# Patient Record
Sex: Male | Born: 1935 | Race: White | Hispanic: No | Smoking: Never smoker
Health system: Southern US, Community
[De-identification: ages and names within clinical notes are randomized; demographics above are authoritative.]

## PROBLEM LIST (undated history)

## (undated) DIAGNOSIS — C61 Malignant neoplasm of prostate: Secondary | ICD-10-CM

## (undated) DIAGNOSIS — Z7901 Long term (current) use of anticoagulants: Secondary | ICD-10-CM

## (undated) DIAGNOSIS — R0789 Other chest pain: Secondary | ICD-10-CM

## (undated) DIAGNOSIS — R42 Dizziness and giddiness: Secondary | ICD-10-CM

## (undated) DIAGNOSIS — H9190 Unspecified hearing loss, unspecified ear: Secondary | ICD-10-CM

## (undated) DIAGNOSIS — H269 Unspecified cataract: Secondary | ICD-10-CM

## (undated) DIAGNOSIS — I484 Atypical atrial flutter: Secondary | ICD-10-CM

## (undated) DIAGNOSIS — G459 Transient cerebral ischemic attack, unspecified: Secondary | ICD-10-CM

## (undated) HISTORY — DX: Transient cerebral ischemic attack, unspecified: G45.9

## (undated) HISTORY — PX: TONSILLECTOMY: SUR1361

## (undated) HISTORY — PX: EYE SURGERY: SHX253

## (undated) HISTORY — DX: Unspecified cataract: H26.9

## (undated) HISTORY — PX: APPENDECTOMY: SHX54

## (undated) HISTORY — DX: Long term (current) use of anticoagulants: Z79.01

## (undated) HISTORY — DX: Unspecified hearing loss, unspecified ear: H91.90

## (undated) HISTORY — DX: Atypical atrial flutter: I48.4

## (undated) HISTORY — DX: Other chest pain: R07.89

## (undated) HISTORY — DX: Dizziness and giddiness: R42

## (undated) HISTORY — DX: Malignant neoplasm of prostate: C61

---

## 2002-06-05 ENCOUNTER — Encounter: Payer: Self-pay | Admitting: Internal Medicine

## 2002-06-05 ENCOUNTER — Inpatient Hospital Stay (HOSPITAL_COMMUNITY): Admission: EM | Admit: 2002-06-05 | Discharge: 2002-06-07 | Payer: Self-pay | Admitting: Neurosurgery

## 2002-06-06 ENCOUNTER — Encounter: Payer: Self-pay | Admitting: Neurosurgery

## 2002-06-07 ENCOUNTER — Encounter: Payer: Self-pay | Admitting: Neurosurgery

## 2002-06-20 ENCOUNTER — Ambulatory Visit (HOSPITAL_COMMUNITY): Admission: RE | Admit: 2002-06-20 | Discharge: 2002-06-20 | Payer: Self-pay | Admitting: Neurosurgery

## 2002-06-20 ENCOUNTER — Encounter: Payer: Self-pay | Admitting: Neurosurgery

## 2002-08-13 ENCOUNTER — Ambulatory Visit: Admission: RE | Admit: 2002-08-13 | Discharge: 2002-08-27 | Payer: Self-pay | Admitting: Radiation Oncology

## 2004-05-18 ENCOUNTER — Ambulatory Visit: Admission: RE | Admit: 2004-05-18 | Discharge: 2004-08-16 | Payer: Self-pay | Admitting: Radiation Oncology

## 2004-09-22 ENCOUNTER — Ambulatory Visit: Admission: RE | Admit: 2004-09-22 | Discharge: 2004-09-22 | Payer: Self-pay | Admitting: Radiation Oncology

## 2010-08-06 ENCOUNTER — Emergency Department (HOSPITAL_COMMUNITY): Payer: Medicare Other

## 2010-08-06 ENCOUNTER — Inpatient Hospital Stay (HOSPITAL_COMMUNITY)
Admission: EM | Admit: 2010-08-06 | Discharge: 2010-08-12 | DRG: 287 | Disposition: A | Discharge status: Home / Self Care | Payer: Medicare Other | Attending: Cardiology | Admitting: Cardiology

## 2010-08-06 DIAGNOSIS — I4891 Unspecified atrial fibrillation: Secondary | ICD-10-CM | POA: Diagnosis present

## 2010-08-06 DIAGNOSIS — G459 Transient cerebral ischemic attack, unspecified: Secondary | ICD-10-CM | POA: Diagnosis present

## 2010-08-06 DIAGNOSIS — H538 Other visual disturbances: Secondary | ICD-10-CM | POA: Diagnosis present

## 2010-08-06 DIAGNOSIS — I369 Nonrheumatic tricuspid valve disorder, unspecified: Secondary | ICD-10-CM

## 2010-08-06 DIAGNOSIS — Z8546 Personal history of malignant neoplasm of prostate: Secondary | ICD-10-CM

## 2010-08-06 DIAGNOSIS — Z880 Allergy status to penicillin: Secondary | ICD-10-CM

## 2010-08-06 DIAGNOSIS — M1990 Unspecified osteoarthritis, unspecified site: Secondary | ICD-10-CM | POA: Diagnosis present

## 2010-08-06 DIAGNOSIS — R0789 Other chest pain: Secondary | ICD-10-CM | POA: Diagnosis present

## 2010-08-06 DIAGNOSIS — I2510 Atherosclerotic heart disease of native coronary artery without angina pectoris: Secondary | ICD-10-CM | POA: Diagnosis present

## 2010-08-06 DIAGNOSIS — H9190 Unspecified hearing loss, unspecified ear: Secondary | ICD-10-CM | POA: Diagnosis present

## 2010-08-06 DIAGNOSIS — I4892 Unspecified atrial flutter: Principal | ICD-10-CM | POA: Diagnosis present

## 2010-08-06 DIAGNOSIS — R079 Chest pain, unspecified: Secondary | ICD-10-CM

## 2010-08-06 DIAGNOSIS — Z7901 Long term (current) use of anticoagulants: Secondary | ICD-10-CM

## 2010-08-06 LAB — POCT CARDIAC MARKERS
CKMB, poc: 1.2 ng/mL (ref 1.0–8.0)
Myoglobin, poc: 37.9 ng/mL (ref 12–200)
Troponin i, poc: <0.05 ng/mL (ref 0.00–0.09)

## 2010-08-06 LAB — POCT I-STAT, CHEM 8
Calcium, Ion: 1.17 mmol/L (ref 1.12–1.32)
Chloride: 106 mEq/L (ref 96–112)
HCT: 48 % (ref 39.0–52.0)
Sodium: 140 mEq/L (ref 135–145)
TCO2: 25 mmol/L (ref 0–100)

## 2010-08-06 LAB — COMPREHENSIVE METABOLIC PANEL
ALT: 16 U/L (ref 0–53)
AST: 16 U/L (ref 0–37)
Albumin: 3.5 g/dL (ref 3.5–5.2)
Alkaline Phosphatase: 53 U/L (ref 39–117)
Chloride: 113 mEq/L — ABNORMAL HIGH (ref 96–112)
GFR calc Af Amer: >60 mL/min (ref >60)
Potassium: 4.2 mEq/L (ref 3.5–5.1)
Sodium: 140 mEq/L (ref 135–145)
Total Bilirubin: 1.3 mg/dL — ABNORMAL HIGH (ref 0.3–1.2)

## 2010-08-06 LAB — CK TOTAL AND CKMB (NOT AT ARMC): CK, MB: 3 ng/mL (ref 0.3–4.0)

## 2010-08-06 LAB — CBC
Platelets: 159 10*3/uL (ref 150–400)
RBC: 5.06 MIL/uL (ref 4.22–5.81)
RDW: 14.2 % (ref 11.5–15.5)
WBC: 9 10*3/uL (ref 4.0–10.5)

## 2010-08-06 LAB — TSH: TSH: 3.029 u[IU]/mL (ref 0.350–4.500)

## 2010-08-07 DIAGNOSIS — I4891 Unspecified atrial fibrillation: Secondary | ICD-10-CM

## 2010-08-07 LAB — BASIC METABOLIC PANEL
GFR calc non Af Amer: >60 mL/min (ref >60)
Glucose, Bld: 104 mg/dL — ABNORMAL HIGH (ref 70–99)
Potassium: 4 mEq/L (ref 3.5–5.1)
Sodium: 138 mEq/L (ref 135–145)

## 2010-08-07 LAB — CBC
HCT: 40.6 % (ref 39.0–52.0)
Hemoglobin: 14.9 g/dL (ref 13.0–17.0)
RBC: 4.49 MIL/uL (ref 4.22–5.81)
RDW: 14 % (ref 11.5–15.5)
WBC: 6 10*3/uL (ref 4.0–10.5)

## 2010-08-07 LAB — CARDIAC PANEL(CRET KIN+CKTOT+MB+TROPI)
CK, MB: 2 ng/mL (ref 0.3–4.0)
CK, MB: 1.8 ng/mL (ref 0.3–4.0)
Total CK: 28 U/L (ref 7–232)
Total CK: 28 U/L (ref 7–232)

## 2010-08-07 LAB — GLUCOSE, CAPILLARY

## 2010-08-07 LAB — HEPARIN LEVEL (UNFRACTIONATED): Heparin Unfractionated: 0.15 IU/mL — ABNORMAL LOW (ref 0.30–0.70)

## 2010-08-08 LAB — CBC
MCV: 91.6 fL (ref 78.0–100.0)
Platelets: 124 10*3/uL — ABNORMAL LOW (ref 150–400)
RBC: 5 MIL/uL (ref 4.22–5.81)
WBC: 5.1 10*3/uL (ref 4.0–10.5)

## 2010-08-08 NOTE — H&P (Addendum)
Larry Gates, Larry Gates                 ACCOUNT NO.:  [id number]  MEDICAL RECORD NO.:  [id number]           PATIENT TYPE:  I  LOCATION:  2918                         FACILITY:  [facility]  PHYSICIAN:  Denice Bors. Stanford Breed, MD, FACCDATE OF BIRTH:  02-28-1929  DATE OF ADMISSION:  08/06/2010 DATE OF DISCHARGE:                             HISTORY & PHYSICAL   PRIMARY CARDIOLOGIST:  New.  PRIMARY MEDICAL DOCTOR:  Larry Gates.  CHIEF COMPLAINT:  Chest pain.  HISTORY OF PRESENT ILLNESS:  Larry Gates is a 77 year old gentleman with no significant past medical history with the exception of prostate cancer, with no prior cardiac history who presents to [facility] ER with chest pain.  He had been under some stress with his sister-in-law, who refuses to speak to him and recently requested that he return some table.  While driving to take some over to her house around 7:15 this morning, he developed chest discomfort, which he describes as a hurting sensation, difficult to describe, not dull or sharp, just uncomfortable. He dropped off the table.  During that time, the discomfort had subsided.  He came home, while talking to a friend developed blurry vision, felt weak, sat down, and was somewhat sweaty.  He began to feel better, so helped take the trash cans back up the hill to put them away. That is when he experienced chest pain again, with same discomfort without shortness of breath or radiation.  He also had a third episode while going up stairs later on.  He called his son and notified him of the pain, was taken to his primary care providers where he was found to be in atrial tachycardia with heart rates in the 120s to 130s.  Review of the strip demonstrates that he is likely in atrial flutter with 2:1 conduction, of unknown duration.  Blood pressure has been borderline low, in the ER, low 100s, more recently 120 on a diltiazem drip.  He is still going somewhat fast in the  120s.  The chest pain is not associated with position changes or inspiration.  He had an episode of paleness/weakness approximately 1 month ago, but this was not associated with chest pain.  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  Of note also with his episode of blurred vision, he had no loss of consciousness, weakness, or aphasia.  PAST MEDICAL HISTORY: 1. Prostate cancer status post radiation in 2006, followed by Urology     every 12 months. 2. Appendectomy as a child. 3. Hard-of-hearing. 4. Left knee scope remotely. 5. Tonsillectomy. 6. Occasional vertigo when supine.  MEDICATIONS:  Aspirin 81 mg daily, multivitamin 1 tablet daily.  ALLERGIES:  PENICILLIN.  SOCIAL HISTORY:  Larry Gates lives in [place].  He is widowed.  He has one son who is present with his daughter-in-law here today.  He is very active, so doing farm work.  He denies any history of tobacco abuse, alcohol, or drug use.FAMILY HISTORY:  His mother passed away with  complications of Alzheimer's at 35.  Father died at 74 of a CVA.  He has one brother who had possibly stents placed in his 36s to 23s.  REVIEW OF SYSTEMS:  No fevers, chills, shortness of breath, dyspnea on exertion, edema, palpitations, syncope, bright red blood per rectum, melena, or hematemesis.  All other systems reviewed and otherwise negative except those noted in the HPI.  LABORATORY DATA:  WBC 9, hemoglobin 17, hematocrit 46, platelet count 159.  Sodium 140, potassium 4.7, chloride 106, glucose 108, BUN 16, creatinine 0.7.  RADIOLOGY:  None yet.  EKG:  Atrial tachycardia with a heart rate approximately 125, likely AFib, no acute changes.  PHYSICAL EXAMINATION:  VITAL SIGNS:  Temperature 98.2, pulse 127, respirations 16, blood pressure 101/68, pulse ox 99% on 2 L. GENERAL:  This is a pleasant, generally well-appearing male in  no acute distress. HEENT:  Normocephalic and atraumatic.  Extraocular movements are intact. Clear sclerae.  Nares without discharge. NECK:  Supple. HEART:  Auscultation to the heart reveals regular rhythm, tachycardic without significant murmurs, rubs, or gallops. LUNGS:  Clear to auscultation bilaterally without wheezes, rales, or rhonchi. ABDOMEN:  Soft, nontender, nondistended.  Positive bowel sounds. EXTREMITIES:  Warm, dry, and without edema. NEUROLOGIC:  He is alert and oriented x3, responds to questions appropriately with a normal affect.  ASSESSMENT/PLAN:  The patient was seen and examined by Dr. Stanford Breed and myself.  This is a 77 year old gentleman with no significant past medical history except for prostate cancer who presents with three episodes of substernal chest pain without radiation or associated symptoms, but the latter 2 episodes were brought on by exertion.  He was seen by his primary care doctor.  EKG demonstrated atrial flutter.  Dr. Stanford Breed performed carotid massage at the bedside and a strip was obtained.  This was initially thought to be coarse AFib, but Dr. Stanford Breed believes it is atrial flutter and will be reviewing it with the electrophysiologist. 1. Chest pain.  This is concerning for unstable angina.  We plan to     admit the patient, rule out myocardial infarction.  He was     recommended for cardiac catheterization on Monday to define his     coronary anatomy.  Risks and benefits were discussed with the     patient who agrees to proceed.  We will treat him an aspirin,     statin, and heparin as his CT of the head is negative given his     blurry vision.  We do not know what his underlying heart rate will     be if he converts, so we will hold off on beta blockade for now     given his ongoing Cardizem. 2. Atrial flutter.  The duration is unknown of this.  We plan to rate     control right now with Cardizem.  We will check an echo along with     a  TSH.  Dr. Stanford Breed reviewed this case with EP for possible     ablation, although the flutter appears atypical.  If no ablation is     deemed appropriate, plan for TEE cardioversion next week with long-     term Coumadin given possible TIAs. 3. Transient blurred vision, question transient ischemic attack.  We     will check head CT to rule out bleed.  If no bleed, add heparin and     as above check echo and TSH.  He will be continued on his aspirin  if his head CT is negative as well.     Dayna Dunn, P.A.C.   ______________________________ Denice Bors. Stanford Breed, MD, [facility]    DD/MEDQ  D:  08/06/2010  T:  08/07/2010  Job:  [id number]  cc:   [place]  Electronically Signed by Kirk Ruths MD [facility] on 08/08/2010 01:25:51 PM Electronically Signed by Melina Copa  on 08/25/2010 04:21:16 PM

## 2010-08-09 DIAGNOSIS — I2510 Atherosclerotic heart disease of native coronary artery without angina pectoris: Secondary | ICD-10-CM

## 2010-08-09 LAB — CBC
HCT: 45.2 % (ref 39.0–52.0)
Hemoglobin: 16.8 g/dL (ref 13.0–17.0)
RBC: 4.95 MIL/uL (ref 4.22–5.81)
WBC: 6.7 10*3/uL (ref 4.0–10.5)

## 2010-08-09 LAB — BASIC METABOLIC PANEL
CO2: 28 mEq/L (ref 19–32)
Chloride: 105 mEq/L (ref 96–112)
GFR calc Af Amer: >60 mL/min (ref >60)
Sodium: 140 mEq/L (ref 135–145)

## 2010-08-10 LAB — CBC
Hemoglobin: 15.5 g/dL (ref 13.0–17.0)
RBC: 4.62 MIL/uL (ref 4.22–5.81)

## 2010-08-10 LAB — PROTIME-INR
INR: 1.05 (ref 0.00–1.49)
Prothrombin Time: 13.9 seconds (ref 11.6–15.2)

## 2010-08-10 LAB — BASIC METABOLIC PANEL
GFR calc non Af Amer: >60 mL/min (ref >60)
Potassium: 3.7 mEq/L (ref 3.5–5.1)
Sodium: 140 mEq/L (ref 135–145)

## 2010-08-10 LAB — HEPARIN LEVEL (UNFRACTIONATED): Heparin Unfractionated: <0.1 IU/mL — ABNORMAL LOW (ref 0.30–0.70)

## 2010-08-11 LAB — CBC
MCH: 33.6 pg (ref 26.0–34.0)
MCV: 91.6 fL (ref 78.0–100.0)
Platelets: 126 10*3/uL — ABNORMAL LOW (ref 150–400)
RBC: 4.43 MIL/uL (ref 4.22–5.81)
RDW: 14 % (ref 11.5–15.5)

## 2010-08-11 LAB — HEPARIN LEVEL (UNFRACTIONATED)
Heparin Unfractionated: 0.35 IU/mL (ref 0.30–0.70)
Heparin Unfractionated: 0.34 IU/mL (ref 0.30–0.70)

## 2010-08-12 DIAGNOSIS — I4892 Unspecified atrial flutter: Secondary | ICD-10-CM

## 2010-08-12 LAB — CBC
HCT: 42.5 % (ref 39.0–52.0)
MCH: 33.6 pg (ref 26.0–34.0)
MCHC: 36.2 g/dL — ABNORMAL HIGH (ref 30.0–36.0)
RDW: 14.3 % (ref 11.5–15.5)

## 2010-08-12 LAB — PROTIME-INR: Prothrombin Time: 18 seconds — ABNORMAL HIGH (ref 11.6–15.2)

## 2010-08-17 ENCOUNTER — Ambulatory Visit: Payer: Self-pay | Admitting: Cardiovascular Disease

## 2010-08-17 ENCOUNTER — Encounter: Payer: Self-pay | Admitting: Cardiology

## 2010-08-17 DIAGNOSIS — I482 Chronic atrial fibrillation, unspecified: Secondary | ICD-10-CM | POA: Insufficient documentation

## 2010-08-19 NOTE — Cardiovascular Report (Signed)
Larry Gates, Larry Gates NO.:  [id number]  MEDICAL RECORD NO.:  [id number]           PATIENT TYPE:  I  LOCATION:  2013                         FACILITY:  [facility]  PHYSICIAN:  Juanda Bond. Burt Knack, MD  DATE OF BIRTH:  1928-12-14  DATE OF PROCEDURE:  08/09/2010 DATE OF DISCHARGE:                           CARDIAC CATHETERIZATION   PROCEDURE: 1. Left heart catheterization. 2. Selective coronary angiography. 3. Left ventricular angiography.  PROCEDURAL INDICATIONS:  Mr. Fetterman is a 77 year old gentleman who presented with chest pain.  He was found to have atrial flutter with RVR.  His symptoms were concerning for unstable angina in the setting of multiple cardiac risk factors and he was referred for cardiac catheterization.  Risks and indications of procedure were reviewed with the patient. Informed consent was obtained.  The right groin was prepped, draped and anesthetized with 1% lidocaine.  Using modified Seldinger technique, a 5- French sheath was placed in the right femoral artery.  Standard Judkins catheters were used for coronary angiography and left ventriculography. The catheter exchanges were performed over a guidewire.  The patient tolerated the procedure well.  There were no immediate complications.  PROCEDURAL FINDINGS:  Aortic pressure 115/63 with a mean of 85, left ventricular pressure 112/9.  Left ventriculography demonstrates mild global LV hypokinesis.  The LVEF is difficult to estimate in the setting of atrial flutter with RVR. There is marked irregularity with a heart rate in the 120s.  There appeared to be global hypokinesis of the left ventricle and I would estimate the ejection fraction at 45%.  Coronary angiography:  The left mainstem shows mild calcification.  The left main is widely patent and divides into the LAD and left circumflex.  LAD.  The LAD is patent.  It is a large proximal vessel and tapers to a moderate caliber vessel in the  midportion.  There are diffuse irregularities with 20-30% stenosis after the first septal perforator over a long segment of vessel.  There does not appear to be any significant obstructive disease in the LAD.  The mid and distal vessels have diffuse irregularities.  Left circumflex.  There is a moderate caliber intermediate branch. There is no obstructive disease of the intermediate.  The AV groove circumflex has mild irregularity but no significant stenosis.  It gives off two small obtuse marginal branches.  Right coronary artery.  The RCA is patent throughout its course.  It is a smooth vessel without significant stenosis.  The RCA is dominant and gives off small RV marginal, small acute marginal, small PDA, and two small posterolateral branches.  There is no significant stenosis throughout.  ASSESSMENT: 1. Minor diffuse nonobstructive coronary artery disease affecting     predominantly the left anterior descending (coronary) artery. 2. Mild global left ventricular systolic dysfunction. 3. Normal left ventricular end-diastolic pressure.  RECOMMENDATIONS:  The patient does not have obstructive coronary artery disease.  Dr. Jacalyn Lefevre notes were reviewed and we will reinitiate heparin and start warfarin.  He is planning on cardioversion after 3 weeks of therapeutic anticoagulation.     Juanda Bond. Burt Knack, MD     MDC/MEDQ  D:  08/09/2010  T:  08/09/2010  Job:  [id number]  cc:   Denice Bors. Stanford Breed, MD, [facility] Chipper Herb, M.D.  Electronically Signed by Sherren Mocha MD on 08/19/2010 10:40:27 AM

## 2010-08-24 ENCOUNTER — Ambulatory Visit (INDEPENDENT_AMBULATORY_CARE_PROVIDER_SITE_OTHER): Payer: Medicare Other | Admitting: *Deleted

## 2010-08-24 DIAGNOSIS — Z7901 Long term (current) use of anticoagulants: Secondary | ICD-10-CM | POA: Insufficient documentation

## 2010-08-24 DIAGNOSIS — I4891 Unspecified atrial fibrillation: Secondary | ICD-10-CM

## 2010-08-24 LAB — POCT INR: INR: 4.2

## 2010-08-26 ENCOUNTER — Encounter: Payer: Self-pay | Admitting: Cardiology

## 2010-08-30 ENCOUNTER — Encounter: Payer: Self-pay | Admitting: Cardiology

## 2010-08-30 ENCOUNTER — Ambulatory Visit (INDEPENDENT_AMBULATORY_CARE_PROVIDER_SITE_OTHER): Payer: Medicare Other | Admitting: Cardiology

## 2010-08-30 ENCOUNTER — Telehealth: Payer: Self-pay | Admitting: Cardiology

## 2010-08-30 VITALS — BP 144/68 | HR 118 | Resp 18 | Ht 69.0 in | Wt 153.4 lb

## 2010-08-30 DIAGNOSIS — I4891 Unspecified atrial fibrillation: Secondary | ICD-10-CM

## 2010-08-30 DIAGNOSIS — E785 Hyperlipidemia, unspecified: Secondary | ICD-10-CM

## 2010-08-30 DIAGNOSIS — I4892 Unspecified atrial flutter: Secondary | ICD-10-CM | POA: Insufficient documentation

## 2010-08-30 LAB — PROTIME-INR: INR: 2.3 ratio — ABNORMAL HIGH (ref 0.8–1.0)

## 2010-08-30 MED ORDER — DILTIAZEM HCL ER COATED BEADS 360 MG PO CP24: 240.0000 mg | ORAL_CAPSULE | Freq: Every day | ORAL | Status: DC

## 2010-08-30 NOTE — Assessment & Plan Note (Signed)
Continue statin. Check lipids and liver when he returns in 3 months.

## 2010-08-30 NOTE — Assessment & Plan Note (Signed)
Patient had atypical flutter. Heart rate elevated. Increase Cardizem to 360 mg daily. Continue Coumadin. Proceed with cardioversion 3 weeks after INR therapeutic. Reviewed with Dr. Rayann Heman. This flutter is atypical and would be more difficult to ablate.

## 2010-08-30 NOTE — Telephone Encounter (Signed)
Waiting on call results from coumadin from this am.

## 2010-08-30 NOTE — Progress Notes (Signed)
HPI: Patient recently admitted to [name] with chest pain and atrial flutter also symptoms of a TIA. Echocardiogram showed normal function. TSH normal. Cardiac cath revealed an ejection fraction of 45%, minor nonobstructive coronary disease. Patient placed on the rate controlling medications and Coumadin. Since discharge, the patient denies any dyspnea on exertion, orthopnea, PND, pedal edema, palpitations, syncope or chest pain.   Current Outpatient Prescriptions  Medication Sig Dispense Refill  . atorvastatin (LIPITOR) 10 MG tablet Take 10 mg by mouth daily.        [name] diltiazem (CARDIZEM CD) 240 MG 24 hr capsule Take 240 mg by mouth daily.        . Multiple Vitamin (MULTIVITAMIN PO) Take by mouth daily.        [name] warfarin (COUMADIN) 5 MG tablet Take by mouth as directed.        [name] DISCONTD: aspirin 81 MG tablet Take 81 mg by mouth daily.           Past Medical History  Diagnosis Date  . Prostate cancer   . Hard of hearing   . Vertigo   . Atrial flutter   . TIA (transient ischemic attack)     Past Surgical History  Procedure Date  . Appendectomy   . Tonsillectomy     History   Social History  . Marital Status: Widowed    Spouse Name: N/A    Number of Children: N/A  . Years of Education: N/A   Occupational History  . Not on file.   Social History Main Topics  . Smoking status: Never Smoker   . Smokeless tobacco: Not on file  . Alcohol Use: No  . Drug Use: No  . Sexually Active: Not on file   Other Topics Concern  . Not on file   Social History Narrative  . No narrative on file    ROS: no fevers or chills, productive cough, hemoptysis, dysphasia, odynophagia, melena, hematochezia, dysuria, hematuria, rash, seizure activity, orthopnea, PND, pedal edema, claudication. Remaining systems are negative.  Physical Exam: Well-developed well-nourished in no acute distress.  Skin is warm and dry.  HEENT is normal.  Neck is supple. No thyromegaly.  Chest is  clear to auscultation with normal expansion.  Cardiovascular exam is tachycardic and irregular Abdominal exam nontender or distended. No masses palpated. Right groin showed no hematoma and no bruit. Extremities show no edema. neuro grossly intact  ECG Atypical flutter with a heart rate of 118. No ST changes.

## 2010-08-30 NOTE — Patient Instructions (Signed)
Your physician recommends that you schedule a follow-up appointment in: 3 MONTHS WITH DR Stanford Breed  APPOINTMENT IN THE [facility] IN ONE WEEK(PENDING CARDIOVERSION)  Your physician recommends that you return for lab work in: [place] physician has recommended that you have a Cardioversion (DCCV). Electrical Cardioversion uses a jolt of electricity to your heart either through paddles or wired patches attached to your chest. This is a controlled, usually prescheduled, procedure. Defibrillation is done under light anesthesia in the hospital, and you usually go home the day of the procedure. This is done to get your heart back into a normal rhythm. You are not awake for the procedure. Please see the instruction sheet given to you today.

## 2010-08-31 ENCOUNTER — Ambulatory Visit (INDEPENDENT_AMBULATORY_CARE_PROVIDER_SITE_OTHER): Payer: Self-pay | Admitting: *Deleted

## 2010-08-31 DIAGNOSIS — R0989 Other specified symptoms and signs involving the circulatory and respiratory systems: Secondary | ICD-10-CM

## 2010-08-31 NOTE — Telephone Encounter (Signed)
Doen.  See coumadin note.

## 2010-09-01 ENCOUNTER — Telehealth: Payer: Self-pay | Admitting: Cardiology

## 2010-09-01 NOTE — Discharge Summary (Signed)
Larry Gates, Larry Gates NO.:  [id number]  MEDICAL RECORD NO.:  [id number]           PATIENT TYPE:  I  LOCATION:  2013                         FACILITY:  [facility]  PHYSICIAN:  Larry Gates, M.D.DATE OF BIRTH:  [birth year]  DATE OF ADMISSION:  08/06/2010 DATE OF DISCHARGE:  08/12/2010                              DISCHARGE SUMMARY   PRIMARY CARDIOLOGIST:  Larry Gates. Larry Breed, MD, [facility].  PRIMARY CARE PROVIDER:  Chipper Herb, MD  DISCHARGE DIAGNOSIS:  Atrial flutter with rapid ventricular response.  SECONDARY DIAGNOSES: 1. Chest pain without objective evidence of ischemia. 2. Nonobstructive coronary artery disease. 3. Possible transient ischemic attack with negative head CT this     admission. 4. History of prostate cancer status post radiation 2006. 5. Hard of hearing. 6. History of vertigo. 7. Osteoarthritis, status post left knee arthroscopy. 8. History of appendectomy as a child, status post tonsillectomy.  ALLERGIES:  PENICILLIN.  PROCEDURES: 1. 2-D echocardiogram, August 06, 2010, EF 55-60%, PASP 32 mmHg. 2. Diagnostic cardiac catheterization, August 10, 2010, showing     nonobstructive coronary artery disease with estimated EF 45%, mild     global hypokinesis, EF was difficult to objective secondary to     tachyarrhythmias. 3. Head CT, performed August 06, 2010, showing no acute or focal     intracranial abnormality.  Right maxillary sinusitis.  HISTORY OF PRESENT ILLNESS:  A 77 year old Caucasian male with the above problem list, who was in his usual state of health until the morning of admission when he had sudden onset of substernal chest discomfort. Later in the morning while talking with a friend, he had sudden onset of blurry vision, weakness, as well as diaphoresis.  He later experienced recurrent chest discomfort prompting him to present to his primary care provider's office and was found to be in atrial flutter with rates in 120s to 130s.   He was sent to [facility] ED for evaluation, where he was placed on diltiazem infusion with improvement in rate into the low 100s. He is admitted for further evaluation.  HOSPITAL COURSE:  It was felt, the patient's atrial flutter was atypical and likely not amenable to ablation.  The patient ruled out for MI. Chest pain was concerning for angina and therefore decision was made to pursue cardiac catheterization.  First, 2-D echocardiogram was undertaken on August 06, 2010, showing normal LV function.  No recurrent chest pain over the weekend, was maintained on heparin infusion.  His rate was reasonably controlled on diltiazem therapy.  Underwent diagnostic catheterization, on August 10, 2010, showing nonobstructive coronary artery disease.  Postcatheterization, he was placed on Lovenox and Coumadin was begun with the plan for cardioversion following 4 weeks of therapeutic INR.  The patient's INR this morning was 1.47.  As such, the patient will go home with Lovenox bridge because of concern for possible TIA the day of admission ( head CT was negative this admission) and will have a follow up INR in [place] on Monday August 16, 2010.  The patient is being discharged home today in good condition.  DISCHARGE LABS:  Hemoglobin 15.4, hematocrit 42.5, WBC 5.1, platelets 131, INR 1.47.  Sodium 140, potassium 3.7, chloride 107, CO2 of 28, BUN 15, creatinine 0.95, glucose 130, total bilirubin 1.3, alkaline phosphatase 53, AST 16, ALT 16, total protein 5.8, albumin 3.5, calcium 8.5, hemoglobin A1c 4.5, CK 28, MB 120, troponin-I 0.04, TSH 3.029. MRSA screen was negative.  DISPOSITION:  The patient will be discharged home today in good condition.  FOLLOWUP APPOINTMENTS:  The patient will follow up at [facility] for an INR on Monday August 16, 2010.  The patient will require weekly INRs document therapeutic range.  A follow up with Dr. Stanford Gates on  Aug 30, 2010, at 10 a.m. at which point, we were able to arrange an outpatient cardioversion.  DISCHARGE MEDICATIONS: 1. Diltiazem 240 mg daily. 2. Enoxaparin 70 mg b.i.d. x3 doses. 3. Lipitor 10 mg at bedtime. 4. Coumadin 5 mg at bedtime. 5. Multivitamin 1 tablet daily.  OUTSTANDING LAB STUDIES:  Follow up INR, August 16, 2010 at [facility] and then weekly thereafter.  Duration discharge encounter is 45 minutes including physician time.     Larry Gates, ANP   ______________________________ Larry Gates, M.D.    CB/MEDQ  D:  08/12/2010  T:  08/12/2010  Job:  [id number]  cc:   Larry Gates, M.D.  Electronically Signed by Larry Gates ANP on 08/14/2010 04:04:29 PM Electronically Signed by Larry Gates M.D. on 09/01/2010 06:05:40 PM

## 2010-09-01 NOTE — Telephone Encounter (Signed)
Wondering when cardioversion will be scheduled

## 2010-09-02 ENCOUNTER — Telehealth: Payer: Self-pay | Admitting: Cardiology

## 2010-09-02 NOTE — Telephone Encounter (Signed)
Spoke with pt son, he is aware the pt will need one more INR above 2.0 to be able to schedule the cardioversion. He has an appt for repeat protime on the 22nd. Fredia Beets

## 2010-09-02 NOTE — Telephone Encounter (Signed)
Pt wife aware will discuss with dr Stanford Breed and let them know Fredia Beets

## 2010-09-02 NOTE — Telephone Encounter (Signed)
Pt wife wants to know if pt is setup or when is his procedure going to be schedule for the cardioversion.

## 2010-09-07 ENCOUNTER — Ambulatory Visit (INDEPENDENT_AMBULATORY_CARE_PROVIDER_SITE_OTHER): Payer: Medicare Other | Admitting: *Deleted

## 2010-09-07 ENCOUNTER — Encounter: Payer: Medicare Other | Admitting: *Deleted

## 2010-09-07 DIAGNOSIS — I4891 Unspecified atrial fibrillation: Secondary | ICD-10-CM

## 2010-09-07 DIAGNOSIS — Z7901 Long term (current) use of anticoagulants: Secondary | ICD-10-CM

## 2010-09-07 LAB — POCT INR: INR: 2.9

## 2010-09-07 NOTE — Telephone Encounter (Signed)
Schedule DCCV when I am noninvasive or float; repeat INR the day prior to the procedure. Kirk Ruths

## 2010-09-07 NOTE — Telephone Encounter (Signed)
Status of cardioversion.

## 2010-09-07 NOTE — Telephone Encounter (Signed)
Per Dr. Stanford Breed pt will also need BMP and CBC day prior to procedure and pt to hold diltiazem the morning of the procedure.  I tried to call central scheduling to schedule for this Friday but they are closed for the day.

## 2010-09-07 NOTE — Telephone Encounter (Signed)
Spoke with pt's daughter-in-law who is asking about timing of cardioversion. Pt was seen by Coumadin clinic today at [facility] office with INR of 2.9. Pt is available rest of this week and would like to proceed with cardioversion as soon as possible.  Will forward to Dr. Stanford Breed to see when to schedule and if additional lab work is needed prior to procedure.  Call back number for tomorrow is pt's son Raimond [phone]

## 2010-09-08 ENCOUNTER — Other Ambulatory Visit: Payer: Self-pay | Admitting: *Deleted

## 2010-09-08 DIAGNOSIS — I4891 Unspecified atrial fibrillation: Secondary | ICD-10-CM

## 2010-09-08 NOTE — Telephone Encounter (Signed)
Spoke with pt's son and gave instructions and date of cardioversion--pt will come here 09/15/10 for BMET,CBC,PT/PTT--date of cardioversion is 09/16/10--son agrees--nt

## 2010-09-14 ENCOUNTER — Encounter: Payer: Medicare Other | Admitting: *Deleted

## 2010-09-15 ENCOUNTER — Telehealth: Payer: Self-pay | Admitting: Cardiology

## 2010-09-15 ENCOUNTER — Ambulatory Visit (INDEPENDENT_AMBULATORY_CARE_PROVIDER_SITE_OTHER): Payer: Medicare Other | Admitting: *Deleted

## 2010-09-15 ENCOUNTER — Other Ambulatory Visit (INDEPENDENT_AMBULATORY_CARE_PROVIDER_SITE_OTHER): Payer: Medicare Other | Admitting: *Deleted

## 2010-09-15 DIAGNOSIS — R0989 Other specified symptoms and signs involving the circulatory and respiratory systems: Secondary | ICD-10-CM

## 2010-09-15 DIAGNOSIS — I4891 Unspecified atrial fibrillation: Secondary | ICD-10-CM

## 2010-09-15 DIAGNOSIS — Z7901 Long term (current) use of anticoagulants: Secondary | ICD-10-CM

## 2010-09-15 LAB — BASIC METABOLIC PANEL
Calcium: 9.1 mg/dL (ref 8.4–10.5)
Chloride: 111 mEq/L (ref 96–112)
Creatinine, Ser: 1 mg/dL (ref 0.4–1.5)
GFR: 79.21 mL/min (ref >60.00)

## 2010-09-15 LAB — CBC WITH DIFFERENTIAL/PLATELET
Basophils Relative: 0.4 % (ref 0.0–3.0)
Eosinophils Relative: 1.5 % (ref 0.0–5.0)
Lymphocytes Relative: 17.5 % (ref 12.0–46.0)
MCV: 97 fl (ref 78.0–100.0)
Monocytes Relative: 7.6 % (ref 3.0–12.0)
Neutrophils Relative %: 73 % (ref 43.0–77.0)
Platelets: 128 10*3/uL — ABNORMAL LOW (ref 150.0–400.0)
RBC: 4.69 Mil/uL (ref 4.22–5.81)
WBC: 5.1 10*3/uL (ref 4.5–10.5)

## 2010-09-15 LAB — POCT INR: INR: 3.2

## 2010-09-15 NOTE — Telephone Encounter (Signed)
Larry Gates has paperwork that this pt scheduled for a cardioversion for 5-31 tomorrow and not on their schedule, this is the third one today and all for  dr Stanford Breed, also she is requesting that the person scheduling per their initials on the paper work

## 2010-09-15 NOTE — Telephone Encounter (Signed)
Larry Gates spoke with Larry Gates. She was made aware by Larry Gates that  paper work needed to be in central schedule not in endo.

## 2010-09-16 ENCOUNTER — Ambulatory Visit (HOSPITAL_COMMUNITY)
Admission: RE | Admit: 2010-09-16 | Discharge: 2010-09-16 | Disposition: A | Discharge status: Home / Self Care | Payer: Medicare Other | Source: Ambulatory Visit | Attending: Cardiology | Admitting: Cardiology

## 2010-09-16 DIAGNOSIS — I4891 Unspecified atrial fibrillation: Secondary | ICD-10-CM

## 2010-09-16 DIAGNOSIS — I2510 Atherosclerotic heart disease of native coronary artery without angina pectoris: Secondary | ICD-10-CM | POA: Insufficient documentation

## 2010-09-17 NOTE — Assessment & Plan Note (Signed)
[facility]  NAME:  LINDA, SEPER NO.:  [id number]  MEDICAL RECORD NO.:  [id number]      DATE OF BIRTH:  05/10/1928  PHYSICIAN:  Denice Bors. Stanford Breed, MD, [facility] VISIT DATE:  09/16/2010                                  OFFICE VISIT   This is cardioversion of atrial fibrillation.  The patient was sedated by Anesthesia with Diprivan 70 mg intravenously.  Synchronized cardioversion with 120 joules resulted in normal sinus rhythm.  There were no immediate complications.  We would recommend continuing Coumadin.     Denice Bors Stanford Breed, MD, [facility]     BSC/MEDQ  D:  09/16/2010  T:  09/17/2010  Job:  [id number]

## 2010-10-01 ENCOUNTER — Ambulatory Visit (INDEPENDENT_AMBULATORY_CARE_PROVIDER_SITE_OTHER): Payer: Medicare Other | Admitting: *Deleted

## 2010-10-01 DIAGNOSIS — Z7901 Long term (current) use of anticoagulants: Secondary | ICD-10-CM

## 2010-10-01 DIAGNOSIS — I4891 Unspecified atrial fibrillation: Secondary | ICD-10-CM

## 2010-10-01 LAB — POCT INR: INR: 2.4

## 2010-10-08 ENCOUNTER — Ambulatory Visit (INDEPENDENT_AMBULATORY_CARE_PROVIDER_SITE_OTHER): Payer: Medicare Other | Admitting: *Deleted

## 2010-10-08 DIAGNOSIS — I4891 Unspecified atrial fibrillation: Secondary | ICD-10-CM

## 2010-10-08 DIAGNOSIS — Z7901 Long term (current) use of anticoagulants: Secondary | ICD-10-CM

## 2010-10-08 LAB — POCT INR: INR: 2.8

## 2010-11-05 ENCOUNTER — Ambulatory Visit (INDEPENDENT_AMBULATORY_CARE_PROVIDER_SITE_OTHER): Payer: Medicare Other | Admitting: *Deleted

## 2010-11-05 DIAGNOSIS — Z7901 Long term (current) use of anticoagulants: Secondary | ICD-10-CM

## 2010-11-05 DIAGNOSIS — I4891 Unspecified atrial fibrillation: Secondary | ICD-10-CM

## 2010-11-05 LAB — POCT INR: INR: 2.1

## 2010-11-25 ENCOUNTER — Encounter: Payer: Self-pay | Admitting: Cardiology

## 2010-11-25 ENCOUNTER — Ambulatory Visit (INDEPENDENT_AMBULATORY_CARE_PROVIDER_SITE_OTHER): Payer: Medicare Other | Admitting: Cardiology

## 2010-11-25 ENCOUNTER — Ambulatory Visit (INDEPENDENT_AMBULATORY_CARE_PROVIDER_SITE_OTHER): Payer: Medicare Other | Admitting: *Deleted

## 2010-11-25 DIAGNOSIS — Z7901 Long term (current) use of anticoagulants: Secondary | ICD-10-CM

## 2010-11-25 DIAGNOSIS — I4891 Unspecified atrial fibrillation: Secondary | ICD-10-CM

## 2010-11-25 DIAGNOSIS — E785 Hyperlipidemia, unspecified: Secondary | ICD-10-CM

## 2010-11-25 DIAGNOSIS — E7800 Pure hypercholesterolemia, unspecified: Secondary | ICD-10-CM

## 2010-11-25 DIAGNOSIS — Z79899 Other long term (current) drug therapy: Secondary | ICD-10-CM

## 2010-11-25 LAB — LIPID PANEL
Cholesterol: 119 mg/dL (ref 0–200)
Triglycerides: 55 mg/dL (ref 0.0–149.0)

## 2010-11-25 LAB — HEPATIC FUNCTION PANEL
ALT: 23 U/L (ref 0–53)
Albumin: 4.3 g/dL (ref 3.5–5.2)
Bilirubin, Direct: 0.2 mg/dL (ref 0.0–0.3)
Total Protein: 6.8 g/dL (ref 6.0–8.3)

## 2010-11-25 LAB — POCT INR: INR: 2

## 2010-11-25 NOTE — Assessment & Plan Note (Signed)
Monitor in the Coumadin clinic. Goal INR 2-3.

## 2010-11-25 NOTE — Assessment & Plan Note (Signed)
The patient remains in sinus rhythm. Continue Cardizem. Possible previous TIA and age greater than 33. Continue Coumadin with goal INR 2-3.

## 2010-11-25 NOTE — Assessment & Plan Note (Signed)
Continue statin. Check lipids and liver. 

## 2010-11-25 NOTE — Patient Instructions (Signed)
Your physician wants you to follow-up in: 6 MONTHS You will receive a reminder letter in the mail two months in advance. If you don't receive a letter, please call our office to schedule the follow-up appointment. 

## 2010-11-25 NOTE — Progress Notes (Signed)
HPI: Patient previously admitted to [facility] with chest pain and atrial flutter also symptoms of a TIA. Echocardiogram showed normal function. TSH normal. Cardiac cath revealed an ejection fraction of 45%, minor nonobstructive coronary disease. Patient placed on the rate controlling medications and Coumadin. Had DCCV of atrial fibrillation on Sep 16 2010. Since then, the patient denies any dyspnea on exertion, orthopnea, PND, pedal edema, palpitations, syncope or chest pain.    Current Outpatient Prescriptions  Medication Sig Dispense Refill  . atorvastatin (LIPITOR) 10 MG tablet Take 10 mg by mouth daily.        [name] diltiazem (CARDIZEM CD) 360 MG 24 hr capsule Take 1 capsule (360 mg total) by mouth daily.  30 capsule  11  . Multiple Vitamin (MULTIVITAMIN PO) Take by mouth daily.        [name] warfarin (COUMADIN) 5 MG tablet Take by mouth as directed.           Past Medical History  Diagnosis Date  . Prostate cancer   . Hard of hearing   . Vertigo   . Atrial flutter   . TIA (transient ischemic attack)   . Atrial fibrillation     Past Surgical History  Procedure Date  . Appendectomy   . Tonsillectomy     History   Social History  . Marital Status: Widowed    Spouse Name: N/A    Number of Children: N/A  . Years of Education: N/A   Occupational History  . Not on file.   Social History Main Topics  . Smoking status: Never Smoker   . Smokeless tobacco: Not on file  . Alcohol Use: No  . Drug Use: No  . Sexually Active: Not on file   Other Topics Concern  . Not on file   Social History Narrative  . No narrative on file    ROS: no fevers or chills, productive cough, hemoptysis, dysphasia, odynophagia, melena, hematochezia, dysuria, hematuria, rash, seizure activity, orthopnea, PND, pedal edema, claudication. Remaining systems are negative.  Physical Exam: Well-developed well-nourished in no acute distress.  Skin is warm and dry.  HEENT is normal.  Neck is  supple. No thyromegaly.  Chest is clear to auscultation with normal expansion.  Cardiovascular exam is regular rate and rhythm.  Abdominal exam nontender or distended. No masses palpated. Extremities show no edema. neuro grossly intact  ECG NSR with no ST changes

## 2010-11-27 ENCOUNTER — Other Ambulatory Visit: Payer: Self-pay | Admitting: Cardiovascular Disease

## 2010-12-01 ENCOUNTER — Encounter: Payer: Self-pay | Admitting: *Deleted

## 2010-12-06 ENCOUNTER — Telehealth: Payer: Self-pay | Admitting: Cardiology

## 2010-12-06 NOTE — Telephone Encounter (Signed)
Per pt daughter calling wanting to know if pt needs to continue on his cholesterol medicine. Pt cholesterol is currently 119. Please return pt daughter call to advise/discuss.

## 2010-12-10 NOTE — Telephone Encounter (Signed)
Spoke with pts dtr-n-law, ann, they want to know since the pts total cholesterol is 119 can he stop his statin. Explained the benefit of statin in a person with CAD. She states the pt does not like to take meds and since he has never had problems with his chol does he really need to take. They would like for me to ask dr Stanford Breed Will forward for dr Stanford Breed review Fredia Beets

## 2010-12-10 NOTE — Telephone Encounter (Signed)
Pt daughter calling back for the second time asking if pt needs to continue taking his cholesterol medicine. Please return call to advise/discuss.

## 2010-12-10 NOTE — Telephone Encounter (Signed)
Would continue statin Kirk Ruths

## 2010-12-13 NOTE — Telephone Encounter (Signed)
Left message for pts dtr-n-law of dr Jacalyn Lefevre recommendations Fredia Beets

## 2010-12-24 ENCOUNTER — Ambulatory Visit (INDEPENDENT_AMBULATORY_CARE_PROVIDER_SITE_OTHER): Payer: Medicare Other | Admitting: *Deleted

## 2010-12-24 DIAGNOSIS — Z7901 Long term (current) use of anticoagulants: Secondary | ICD-10-CM

## 2010-12-24 DIAGNOSIS — I4891 Unspecified atrial fibrillation: Secondary | ICD-10-CM

## 2011-01-20 ENCOUNTER — Ambulatory Visit: Payer: Self-pay | Admitting: *Deleted

## 2011-01-21 ENCOUNTER — Encounter: Payer: Medicare Other | Admitting: *Deleted

## 2011-04-18 ENCOUNTER — Other Ambulatory Visit: Payer: Self-pay | Admitting: Cardiovascular Disease

## 2011-04-18 NOTE — Telephone Encounter (Signed)
Future warfarin prescriptions need to come from [place] as they are managing pt's coumadin now.

## 2011-05-24 ENCOUNTER — Encounter: Payer: Self-pay | Admitting: Cardiology

## 2011-05-24 ENCOUNTER — Ambulatory Visit (INDEPENDENT_AMBULATORY_CARE_PROVIDER_SITE_OTHER): Payer: Medicare Other | Admitting: Cardiology

## 2011-05-24 VITALS — BP 120/80 | HR 72 | Ht 69.0 in | Wt 149.0 lb

## 2011-05-24 DIAGNOSIS — I4891 Unspecified atrial fibrillation: Secondary | ICD-10-CM

## 2011-05-24 DIAGNOSIS — E785 Hyperlipidemia, unspecified: Secondary | ICD-10-CM

## 2011-05-24 DIAGNOSIS — I4892 Unspecified atrial flutter: Secondary | ICD-10-CM

## 2011-05-24 DIAGNOSIS — Z7901 Long term (current) use of anticoagulants: Secondary | ICD-10-CM

## 2011-05-24 LAB — CBC WITH DIFFERENTIAL/PLATELET
Eosinophils Absolute: 0.1 10*3/uL (ref 0.0–0.7)
Eosinophils Relative: 1.1 % (ref 0.0–5.0)
MCHC: 35.4 g/dL (ref 30.0–36.0)
MCV: 96.5 fl (ref 78.0–100.0)
Monocytes Absolute: 0.5 10*3/uL (ref 0.1–1.0)
Neutrophils Relative %: 69.2 % (ref 43.0–77.0)
Platelets: 140 10*3/uL — ABNORMAL LOW (ref 150.0–400.0)
WBC: 5.1 10*3/uL (ref 4.5–10.5)

## 2011-05-24 MED ORDER — DILTIAZEM HCL ER COATED BEADS 120 MG PO CP24: 120.0000 mg | ORAL_CAPSULE | Freq: Every day | ORAL | Status: DC

## 2011-05-24 MED ORDER — WARFARIN SODIUM 5 MG PO TABS: ORAL_TABLET | ORAL | Status: DC

## 2011-05-24 NOTE — Assessment & Plan Note (Signed)
Management per primary care. 

## 2011-05-24 NOTE — Assessment & Plan Note (Signed)
Coumadin is monitored by primary care. Check hemoglobin.

## 2011-05-24 NOTE — Patient Instructions (Signed)
Your physician wants you to follow-up in: [place] will receive a reminder letter in the mail two months in advance. If you don't receive a letter, please call our office to schedule the follow-up appointment.   START CARDIZEM CD 120 MG ONCE DAILY  Your physician recommends that you return for lab work in: [place]

## 2011-05-24 NOTE — Assessment & Plan Note (Signed)
Patient remains in sinus rhythm. Continue Coumadin. Resume Cardizem CD 120 mg daily for rate control in case atrial fibrillation recurs.

## 2011-05-24 NOTE — Progress Notes (Signed)
   [id number] previously admitted to [facility] with chest pain and atrial flutter also symptoms of a TIA. Echocardiogram showed normal function. TSH normal. Cardiac cath revealed an ejection fraction of 45%, minor nonobstructive coronary disease. Patient placed on the rate controlling medications and Coumadin. Had DCCV of atrial fibrillation on Sep 16 2010. I last saw him in August of 2012. Since then, the patient denies any dyspnea on exertion, orthopnea, PND, pedal edema, palpitations, syncope or chest pain.   Current Outpatient Prescriptions  Medication Sig Dispense Refill  . Multiple Vitamin (MULTIVITAMIN PO) Take by mouth daily.        [name] warfarin (COUMADIN) 5 MG tablet TAKE ONE TABLET BY MOUTH AT BEDTIME  30 tablet  2     Past Medical History  Diagnosis Date  . Prostate cancer   . Hard of hearing   . Vertigo   . Atrial flutter   . TIA (transient ischemic attack)   . Atrial fibrillation     Past Surgical History  Procedure Date  . Appendectomy   . Tonsillectomy     History   Social History  . Marital Status: Widowed    Spouse Name: N/A    Number of Children: N/A  . Years of Education: N/A   Occupational History  . Not on file.   Social History Main Topics  . Smoking status: Never Smoker   . Smokeless tobacco: Not on file  . Alcohol Use: No  . Drug Use: No  . Sexually Active: Not on file   Other Topics Concern  . Not on file   Social History Narrative  . No narrative on file    ROS: no fevers or chills, productive cough, hemoptysis, dysphasia, odynophagia, melena, hematochezia, dysuria, hematuria, rash, seizure activity, orthopnea, PND, pedal edema, claudication. Remaining systems are negative.  Physical Exam: Well-developed well-nourished in no acute distress.  Skin is warm and dry.  HEENT is normal.  Neck is supple. No thyromegaly.  Chest is clear to auscultation with normal expansion.  Cardiovascular exam is regular rate and rhythm. 2/6  systolic ejection murmur Abdominal exam nontender or distended. No masses palpated. Extremities show no edema. neuro grossly intact  ECG normal sinus rhythm at a rate of 72. No significant ST changes.

## 2011-08-31 ENCOUNTER — Other Ambulatory Visit: Payer: Self-pay | Admitting: Cardiovascular Disease

## 2011-09-19 ENCOUNTER — Encounter: Payer: Self-pay | Admitting: Cardiology

## 2011-09-19 LAB — PROTIME-INR

## 2011-10-10 ENCOUNTER — Telehealth: Payer: Self-pay | Admitting: Physician Assistant

## 2011-10-10 NOTE — Telephone Encounter (Addendum)
Pt dtr-in-law called because pt had been outside today and gotten very hot. He came in and cooled down but did not feel entirely well. RN friend came by and states BP 120/72 but HR is approx 160 and irregular. Advised them to hydrate him and call EMS. If EMS checks pt and gets a similar HR, he should come in by ambulance to the ER. They will do so.  EMS called from the home. Pt drank water and HR currently in the 130s, she believes SR. Pt does NOT want to come to the ER. He feels better after the water.   Advised the dtr that pt may have been mainly dehydrated with/without a bout of afib. Now that he feels better, if he is refusing to come to ER, would not push too hard. He is to continue to drink water, not work a long time outside if it's hot and the office will call him in am to check on him and get him an OV.

## 2011-10-11 ENCOUNTER — Encounter: Payer: Self-pay | Admitting: Nurse Practitioner

## 2011-10-11 ENCOUNTER — Ambulatory Visit (INDEPENDENT_AMBULATORY_CARE_PROVIDER_SITE_OTHER): Payer: Medicare Other | Admitting: Nurse Practitioner

## 2011-10-11 ENCOUNTER — Telehealth: Payer: Self-pay | Admitting: Cardiology

## 2011-10-11 VITALS — BP 118/88 | HR 122 | Ht 68.0 in | Wt 151.0 lb

## 2011-10-11 DIAGNOSIS — I4892 Unspecified atrial flutter: Secondary | ICD-10-CM

## 2011-10-11 LAB — CBC WITH DIFFERENTIAL/PLATELET
Basophils Absolute: 0 10*3/uL (ref 0.0–0.1)
Basophils Relative: 0.5 % (ref 0.0–3.0)
HCT: 48.2 % (ref 39.0–52.0)
Hemoglobin: 16.3 g/dL (ref 13.0–17.0)
Lymphs Abs: 1 10*3/uL (ref 0.7–4.0)
MCHC: 33.9 g/dL (ref 30.0–36.0)
Monocytes Relative: 8.1 % (ref 3.0–12.0)
Neutro Abs: 4.6 10*3/uL (ref 1.4–7.7)
RBC: 4.91 Mil/uL (ref 4.22–5.81)
RDW: 14.8 % — ABNORMAL HIGH (ref 11.5–14.6)

## 2011-10-11 LAB — BASIC METABOLIC PANEL
CO2: 25 mEq/L (ref 19–32)
Glucose, Bld: 100 mg/dL — ABNORMAL HIGH (ref 70–99)
Potassium: 4.1 mEq/L (ref 3.5–5.1)
Sodium: 138 mEq/L (ref 135–145)

## 2011-10-11 MED ORDER — DILTIAZEM HCL ER COATED BEADS 240 MG PO CP24: 240.0000 mg | ORAL_CAPSULE | Freq: Every day | ORAL | Status: DC

## 2011-10-11 NOTE — Progress Notes (Signed)
Patient Name: Larry Gates Date of Encounter: 10/11/2011  Primary Care Provider:  Redge Gainer, MD Primary Cardiologist:  B. Stanford Breed, MD  Patient Profile  78 year old male with history of atypical atrial flutter who presents with recurrent atrial flutter.  Problem List   Past Medical History  Diagnosis Date  . Prostate cancer   . Hard of hearing   . Vertigo   . Atypical atrial flutter     a. Dx 07/2010 and coumadin initiated;  b. 08/2010 s/p DCCV.  [name] TIA (transient ischemic attack)   . Midsternal chest pain     a. In setting of A Flutter 07/2010;  b. Cath 07/2010: nonobs dzs  . Anticoagulated on Coumadin     a. Followed @ [facility]   Past Surgical History  Procedure Date  . Appendectomy   . Tonsillectomy     Allergies  Allergies  Allergen Reactions  . Penicillins     HPI  78 year old male with the above problem list.  He is status post cardioversion in 2012 secondary to atypical atrial flutter.  At that time this letter was not felt to be easily amenable to ablation.  He is on chronic Coumadin anticoagulation and this is followed monthly at [facility].  His last subtherapeutic INR was in January of this year.  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  He developed mild chest discomfort which resolved with rest.  He then went back out into the fields later in the afternoon with recurrence of chest discomfort which again resolved with rest.  EMS was called out to the house by his family and they were told that the patient was tachycardic and likely dehydrated.  They had also talked to our on-call staff and this appointment was arranged today.  ECG this morning shows atypical atrial flutter at a rate of 122.  Patient is completely asymptomatic at rest.  Home Medications  Prior to Admission medications   Medication Sig Start Date End Date Taking?  Authorizing Provider  diltiazem (CARDIZEM CD) 240 MG 24 hr capsule Take 1 capsule (240 mg total) by mouth daily. 10/11/11 10/10/12 Yes Rogelia Mire, NP  Multiple Vitamin (MULTIVITAMIN PO) Take by mouth daily.     Yes Historical Provider, MD  warfarin (COUMADIN) 5 MG tablet TAKE AS DIRECTED 05/24/11  Yes Lelon Perla, MD    Family History  Family History  Problem Relation Age of Onset  . Stroke Father     Social History  History   Social History  . Marital Status: Widowed    Spouse Name: N/A    Number of Children: N/A  . Years of Education: N/A   Occupational History  . Not on file.   Social History Main Topics  . Smoking status: Never Smoker   . Smokeless tobacco: Not on file  . Alcohol Use: No  . Drug Use: No  . Sexually Active: Not on file   Other Topics Concern  . Not on file   Social History Narrative   Lives in [place] by himself.  Children live nearby and check in on him daily.     Review of Systems General:  No chills, fever, night sweats or weight changes.  Cardiovascular:  ++ chest pain & dyspnea on exertion yesterday.  No palpitations.  No edema, orthopnea, paroxysmal nocturnal dyspnea. Dermatological: No rash, lesions/masses Respiratory: No cough, dyspnea  Urologic: No hematuria, dysuria Abdominal:   No nausea, vomiting, diarrhea, bright red blood per rectum, melena, or hematemesis Neurologic:  No visual changes, wkns, changes in mental status. All other systems reviewed and are otherwise negative except as noted above.  Physical Exam  Blood pressure 118/88, pulse 122, height 5\' 8"  (1.727 m), weight 151 lb (68.493 kg).  General: Pleasant, NAD Psych: Normal affect. Neuro: Alert and oriented X 3. Moves all extremities spontaneously. HEENT: Normal  Neck: Supple without bruits or JVD. Lungs:  Resp regular and unlabored, CTA. Heart: irregular, tachycardic. no s3, s4, or murmurs. Abdomen: Soft, non-tender, non-distended, BS + x 4.  Extremities:  No clubbing, cyanosis or edema. DP/PT/Radials 2+ and equal bilaterally.  Accessory Clinical Findings  ECG - atrial flutter, 122, no acute ST or T changes.  Assessment & Plan  1.  Atypical atrial flutter: Patient presents with recurrent atrial flutter.  It should be noted that his only symptom in 2012 was chest pain which he also had yesterday.  His catheterization in 2012 showed nonobstructive disease.  He is currently asymptomatic.  An attempt was made at rate control in the past however even at 360 mg of diltiazem, was unsuccessful.  He is chronically anticoagulated on Coumadin and this is followed in [place].  We have obtained his INRs and they have been therapeutic dating back to February.  I have discussed his case with Dr. Stanford Breed and we will arrange for outpatient cardioversion.  I have increased his diltiazem dose to 240 mg daily.  We will check a CBC, a basic metabolic profile, and INR today.  Patient and family are in agreement with this plan.  Murray Hodgkins, NP 10/11/2011, 1:15 PM

## 2011-10-11 NOTE — Patient Instructions (Addendum)
Your physician has recommended that you have a Cardioversion (DCCV). Electrical Cardioversion uses a jolt of electricity to your heart either through paddles or wired patches attached to your chest. This is a controlled, usually prescheduled, procedure. Defibrillation is done under light anesthesia in the hospital, and you usually go home the day of the procedure. This is done to get your heart back into a normal rhythm. You are not awake for the procedure. Please see the instruction sheet given to you today.   Your physician recommends that you have LAB WORK TODAY  INCREASE DILTIAZEM TO 240 MG ONCE DAILY

## 2011-10-11 NOTE — Telephone Encounter (Signed)
I spoke with daughter and pt did not have any more episodes of chest pain last pm or ? Irregular heart rate. He was advised to follow-up today with Ignacia Bayley at 10:30am.  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

## 2011-10-11 NOTE — Telephone Encounter (Signed)
Pt had chest pain last night and SOB and called on call person last night and was told to call office to be seen today

## 2011-10-12 ENCOUNTER — Telehealth: Payer: Self-pay | Admitting: Cardiology

## 2011-10-12 ENCOUNTER — Other Ambulatory Visit: Payer: Self-pay | Admitting: Cardiology

## 2011-10-12 MED ORDER — SODIUM CHLORIDE 0.9 % IV SOLN: 250.0000 mL | INTRAVENOUS | Status: DC

## 2011-10-12 MED ORDER — SODIUM CHLORIDE 0.9 % IJ SOLN: 3.0000 mL | Freq: Two times a day (BID) | INTRAMUSCULAR | Status: DC

## 2011-10-12 MED ORDER — SODIUM CHLORIDE 0.9 % IJ SOLN: 3.0000 mL | INTRAMUSCULAR | Status: DC | PRN

## 2011-10-12 MED ORDER — HYDROCORTISONE 1 % EX CREA: 1.0000 "application " | TOPICAL_CREAM | Freq: Three times a day (TID) | CUTANEOUS | Status: DC | PRN

## 2011-10-12 NOTE — Telephone Encounter (Signed)
Spoke with pt dtr, pt is scheduled for a DCCV with dr Harrington Challenger Friday 10-14-11 @ 1 pm. He will take all his meds the am of the procedure. protimes from [facility] will be scanned.

## 2011-10-12 NOTE — Telephone Encounter (Signed)
Pt was to have a cardioversion this week and hasn't heard pls call

## 2011-10-12 NOTE — Telephone Encounter (Signed)
Spoke with pt dtr, aware will dicuss with dr Stanford Breed and call her back

## 2011-10-13 ENCOUNTER — Encounter (HOSPITAL_COMMUNITY): Payer: Self-pay | Admitting: Pharmacy Technician

## 2011-10-14 ENCOUNTER — Ambulatory Visit (HOSPITAL_COMMUNITY)
Admission: RE | Admit: 2011-10-14 | Discharge: 2011-10-14 | Disposition: A | Discharge status: Home / Self Care | Payer: Medicare Other | Source: Ambulatory Visit | Attending: Cardiology | Admitting: Cardiology

## 2011-10-14 ENCOUNTER — Encounter (HOSPITAL_COMMUNITY): Payer: Self-pay | Admitting: *Deleted

## 2011-10-14 ENCOUNTER — Encounter (HOSPITAL_COMMUNITY): Payer: Self-pay | Admitting: Anesthesiology

## 2011-10-14 ENCOUNTER — Encounter (HOSPITAL_COMMUNITY)
Admission: RE | Disposition: A | Discharge status: Home / Self Care | Payer: Self-pay | Source: Ambulatory Visit | Attending: Cardiology

## 2011-10-14 ENCOUNTER — Ambulatory Visit (HOSPITAL_COMMUNITY): Payer: Medicare Other | Admitting: Anesthesiology

## 2011-10-14 DIAGNOSIS — Z8673 Personal history of transient ischemic attack (TIA), and cerebral infarction without residual deficits: Secondary | ICD-10-CM | POA: Insufficient documentation

## 2011-10-14 DIAGNOSIS — Z7901 Long term (current) use of anticoagulants: Secondary | ICD-10-CM | POA: Insufficient documentation

## 2011-10-14 DIAGNOSIS — H9190 Unspecified hearing loss, unspecified ear: Secondary | ICD-10-CM | POA: Insufficient documentation

## 2011-10-14 DIAGNOSIS — I4891 Unspecified atrial fibrillation: Secondary | ICD-10-CM

## 2011-10-14 DIAGNOSIS — I4892 Unspecified atrial flutter: Secondary | ICD-10-CM | POA: Insufficient documentation

## 2011-10-14 DIAGNOSIS — Z8546 Personal history of malignant neoplasm of prostate: Secondary | ICD-10-CM | POA: Insufficient documentation

## 2011-10-14 HISTORY — PX: CARDIOVERSION: SHX1299

## 2011-10-14 LAB — PROTIME-INR: Prothrombin Time: 28.6 seconds — ABNORMAL HIGH (ref 11.6–15.2)

## 2011-10-14 SURGERY — CARDIOVERSION
Anesthesia: General | Wound class: Clean

## 2011-10-14 MED ORDER — SODIUM CHLORIDE 0.9 % IJ SOLN: 3.0000 mL | Freq: Two times a day (BID) | INTRAMUSCULAR | Status: DC

## 2011-10-14 MED ORDER — SODIUM CHLORIDE 0.9 % IV SOLN: 250.0000 mL | INTRAVENOUS | Status: DC

## 2011-10-14 MED ORDER — PROPOFOL 10 MG/ML IV BOLUS
INTRAVENOUS | Status: DC | PRN
  Administered 2011-10-14: 100 mg via INTRAVENOUS

## 2011-10-14 MED ORDER — SODIUM CHLORIDE 0.9 % IV SOLN
INTRAVENOUS | Status: DC | PRN
  Administered 2011-10-14: 13:00:00 via INTRAVENOUS

## 2011-10-14 MED ORDER — SODIUM CHLORIDE 0.9 % IJ SOLN: 3.0000 mL | INTRAMUSCULAR | Status: DC | PRN

## 2011-10-14 MED ORDER — HYDROCORTISONE 1 % EX CREA: 1.0000 "application " | TOPICAL_CREAM | Freq: Three times a day (TID) | CUTANEOUS | Status: DC | PRN

## 2011-10-14 NOTE — Transfer of Care (Signed)
Immediate Anesthesia Transfer of Care Note  Patient: Larry Gates  Procedure(s) Performed: Procedure(s) (LRB): CARDIOVERSION (N/A)  Patient Location- short stay  Anesthesia Type: General  Level of Consciousness: awake, alert  and oriented  Airway & Oxygen Therapy: [place] o2 spont RR  Post-op Assessment: Report given to PACU RN and Post -op Vital signs reviewed and stable  Post vital signs: Reviewed and stable  Complications: No apparent anesthesia complications

## 2011-10-14 NOTE — Interval H&P Note (Signed)
History and Physical Interval Note:  10/14/2011 1:08 PM  Larry Gates  has presented today for surgery, with the diagnosis of AFIB  The various methods of treatment have been discussed with the patient and family. After consideration of risks, benefits and other options for treatment, the patient has consented to  Procedure(s) (LRB): CARDIOVERSION (N/A) as a surgical intervention .  The patient's history has been reviewed, patient examined, no change in status, stable for surgery.  I have reviewed the patients' chart and labs.  Questions were answered to the patient's satisfaction.     Dorris Carnes

## 2011-10-14 NOTE — Anesthesia Postprocedure Evaluation (Signed)
  Anesthesia Post-op Note  Patient: Larry Gates  Procedure(s) Performed: Procedure(s) (LRB): CARDIOVERSION (N/A)  Patient Location: PACU and Short Stay  Anesthesia Type: General  Level of Consciousness: awake and alert   Airway and Oxygen Therapy: Patient Spontanous Breathing and Patient connected to nasal cannula oxygen  Post-op Pain: none  Post-op Assessment: Post-op Vital signs reviewed  Post-op Vital Signs: Reviewed and stable  Complications: No apparent anesthesia complications

## 2011-10-14 NOTE — Discharge Instructions (Signed)
Electrical Cardioversion Cardioversion is the delivery of a jolt of electricity to change the rhythm of the heart. Sticky patches or metal paddles are placed on the chest to deliver the electricity from a special device. This is done to restore a normal rhythm. A rhythm that is too fast or not regular keeps the heart from pumping well. Compared to medicines used to change an abnormal rhythm, cardioversion is faster and works better. It is also unpleasant and may dislodge blood clots from the heart. WHEN WOULD THIS BE DONE?  In an emergency:   There is low or no blood pressure as a result of the heart rhythm.   Normal rhythm must be restored as fast as possible to protect the brain and heart from further damage.   It may save a life.   For less serious heart rhythms, such as atrial fibrillation or flutter, in which:   The heart is beating too fast or is not regular.   The heart is still able to pump enough blood, but not as well as it should.   Medicine to change the rhythm has not worked.   It is safe to wait in order to allow time for preparation.  LET YOUR CAREGIVER KNOW ABOUT:   Every medicine you are taking. It is very important to do this! Know when to take or stop taking any of them.   Any time in the past that you have felt your heart was not beating normally.  RISKS AND COMPLICATIONS   Clots may form in the chambers of the heart if it is beating too fast. These clots may be dislodged during the procedure and travel to other parts of the body.   There is risk of a stroke during and after the procedure if a clot moves. Blood thinners lower this risk.   You may have a special test of your heart (TEE) to make sure there are no clots in your heart.  BEFORE THE PROCEDURE   You may have some tests to see how well your heart is working.   You may start taking blood thinners so your blood does not clot as easily.   Other drugs may be given to help your heart work better.   PROCEDURE (SCHEDULED)  The procedure is typically done in a hospital by a heart doctor (cardiologist).   You will be told when and where to go.   You may be given some medicine through an intravenous (IV) access to reduce discomfort and make you sleepy before the procedure.   Your whole body may move when the shock is delivered. Your chest may feel sore.   You may be able to go home after a few hours. Your heart rhythm will be watched to make sure it does not change.  HOME CARE INSTRUCTIONS   Only take medicine as directed by your caregiver. Be sure you understand how and when to take your medicine.   Learn how to feel your pulse and check it often.   Limit your activity for 48 hours.   Avoid caffeine and other stimulants as directed.  SEEK MEDICAL CARE IF:   You feel like your heart is beating too fast or your pulse is not regular.   You have any questions about your medicines.   You have bleeding that will not stop.  SEEK IMMEDIATE MEDICAL CARE IF:   You are dizzy or feel faint.   It is hard to breathe or you feel short of breath.     There is a change in discomfort in your chest.   Your speech is slurred or you have trouble moving your arm or leg on one side.   You get a muscle cramp.   Your fingers or toes turn cold or blue.  MAKE SURE YOU:   Understand these instructions.   Will watch your condition.   Will get help right away if you are not doing well or get worse.  Document Released: 03/25/2002 Document Revised: 03/24/2011 Document Reviewed: 07/25/2007 Exit[employer] Patient Information 2012 [employer]. 

## 2011-10-14 NOTE — H&P (View-Only) (Signed)
Patient Name: Larry Gates Date of Encounter: 10/11/2011  Primary Care Provider:  Redge Gainer, MD Primary Cardiologist:  B. Stanford Breed, MD  Patient Profile  78 year old male with history of atypical atrial flutter who presents with recurrent atrial flutter.  Problem List   Past Medical History  Diagnosis Date  . Prostate cancer   . Hard of hearing   . Vertigo   . Atypical atrial flutter     a. Dx 07/2010 and coumadin initiated;  b. 08/2010 s/p DCCV.  [name] TIA (transient ischemic attack)   . Midsternal chest pain     a. In setting of A Flutter 07/2010;  b. Cath 07/2010: nonobs dzs  . Anticoagulated on Coumadin     a. Followed @ [facility]   Past Surgical History  Procedure Date  . Appendectomy   . Tonsillectomy     Allergies  Allergies  Allergen Reactions  . Penicillins     HPI  78 year old male with the above problem list.  He is status post cardioversion in 2012 secondary to atypical atrial flutter.  At that time this letter was not felt to be easily amenable to ablation.  He is on chronic Coumadin anticoagulation and this is followed monthly at [facility].  His last subtherapeutic INR was in January of this year.  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  He developed mild chest discomfort which resolved with rest.  He then went back out into the fields later in the afternoon with recurrence of chest discomfort which again resolved with rest.  EMS was called out to the house by his family and they were told that the patient was tachycardic and likely dehydrated.  They had also talked to our on-call staff and this appointment was arranged today.  ECG this morning shows atypical atrial flutter at a rate of 122.  Patient is completely asymptomatic at rest.  Home Medications  Prior to Admission medications   Medication Sig Start Date End Date Taking?  Authorizing Provider  diltiazem (CARDIZEM CD) 240 MG 24 hr capsule Take 1 capsule (240 mg total) by mouth daily. 10/11/11 10/10/12 Yes Rogelia Mire, NP  Multiple Vitamin (MULTIVITAMIN PO) Take by mouth daily.     Yes Historical Provider, MD  warfarin (COUMADIN) 5 MG tablet TAKE AS DIRECTED 05/24/11  Yes Lelon Perla, MD    Family History  Family History  Problem Relation Age of Onset  . Stroke Father     Social History  History   Social History  . Marital Status: Widowed    Spouse Name: N/A    Number of Children: N/A  . Years of Education: N/A   Occupational History  . Not on file.   Social History Main Topics  . Smoking status: Never Smoker   . Smokeless tobacco: Not on file  . Alcohol Use: No  . Drug Use: No  . Sexually Active: Not on file   Other Topics Concern  . Not on file   Social History Narrative   Lives in [place] by himself.  Children live nearby and check in on him daily.     Review of Systems General:  No chills, fever, night sweats or weight changes.  Cardiovascular:  ++ chest pain & dyspnea on exertion yesterday.  No palpitations.  No edema, orthopnea, paroxysmal nocturnal dyspnea. Dermatological: No rash, lesions/masses Respiratory: No cough, dyspnea  Urologic: No hematuria, dysuria Abdominal:   No nausea, vomiting, diarrhea, bright red blood per rectum, melena, or hematemesis Neurologic:  No visual changes, wkns, changes in mental status. All other systems reviewed and are otherwise negative except as noted above.  Physical Exam  Blood pressure 118/88, pulse 122, height 5\' 8"  (1.727 m), weight 151 lb (68.493 kg).  General: Pleasant, NAD Psych: Normal affect. Neuro: Alert and oriented X 3. Moves all extremities spontaneously. HEENT: Normal  Neck: Supple without bruits or JVD. Lungs:  Resp regular and unlabored, CTA. Heart: irregular, tachycardic. no s3, s4, or murmurs. Abdomen: Soft, non-tender, non-distended, BS + x 4.  Extremities:  No clubbing, cyanosis or edema. DP/PT/Radials 2+ and equal bilaterally.  Accessory Clinical Findings  ECG - atrial flutter, 122, no acute ST or T changes.  Assessment & Plan  1.  Atypical atrial flutter: Patient presents with recurrent atrial flutter.  It should be noted that his only symptom in 2012 was chest pain which he also had yesterday.  His catheterization in 2012 showed nonobstructive disease.  He is currently asymptomatic.  An attempt was made at rate control in the past however even at 360 mg of diltiazem, was unsuccessful.  He is chronically anticoagulated on Coumadin and this is followed in [place].  We have obtained his INRs and they have been therapeutic dating back to February.  I have discussed his case with Dr. Stanford Breed and we will arrange for outpatient cardioversion.  I have increased his diltiazem dose to 240 mg daily.  We will check a CBC, a basic metabolic profile, and INR today.  Patient and family are in agreement with this plan.  Murray Hodgkins, NP 10/11/2011, 1:15 PM

## 2011-10-14 NOTE — Anesthesia Preprocedure Evaluation (Addendum)
Anesthesia Evaluation  Patient identified by MRN, date of birth, ID band Patient awake    Reviewed: Allergy & Precautions, H&P , NPO status , Patient's Chart, lab work & pertinent test results  History of Anesthesia Complications Negative for: history of anesthetic complications  Airway Mallampati: III TM Distance: >3 FB Neck ROM: Full    Dental   Pulmonary neg pulmonary ROS,          Cardiovascular + dysrhythmias Atrial Fibrillation     Neuro/Psych Pt denies TIA negative psych ROS   GI/Hepatic negative GI ROS, Neg liver ROS,   Endo/Other  negative endocrine ROS  Renal/GU negative Renal ROS     Musculoskeletal negative musculoskeletal ROS (+)   Abdominal   Peds  Hematology negative hematology ROS (+)   Anesthesia Other Findings   Reproductive/Obstetrics                           Anesthesia Physical Anesthesia Plan  ASA: III  Anesthesia Plan: General   Post-op Pain Management:    Induction: Intravenous  Airway Management Planned: Mask  Additional Equipment:   Intra-op Plan:   Post-operative Plan:   Informed Consent: I have reviewed the patients History and Physical, chart, labs and discussed the procedure including the risks, benefits and alternatives for the proposed anesthesia with the patient or authorized representative who has indicated his/her understanding and acceptance.     Plan Discussed with: CRNA, Anesthesiologist and Surgeon  Anesthesia Plan Comments:        Anesthesia Quick Evaluation

## 2011-10-14 NOTE — Preoperative (Signed)
Beta Blockers   Reason not to administer Beta Blockers:Not Applicable 

## 2011-10-14 NOTE — Op Note (Signed)
Patient sedated by anesthesia with 100 mg Propofol IV  With pads in AP position patient cardioverted to SR with 150 J synchronized biphasic energy.  Procedure without complications.  12 Lead EKG pending.

## 2011-10-17 ENCOUNTER — Encounter (HOSPITAL_COMMUNITY): Payer: Self-pay | Admitting: Cardiology

## 2011-10-19 ENCOUNTER — Telehealth: Payer: Self-pay | Admitting: Cardiology

## 2011-11-08 ENCOUNTER — Ambulatory Visit (INDEPENDENT_AMBULATORY_CARE_PROVIDER_SITE_OTHER): Payer: Medicare Other | Admitting: Cardiology

## 2011-11-08 ENCOUNTER — Encounter: Payer: Self-pay | Admitting: Cardiology

## 2011-11-08 VITALS — BP 141/68 | HR 74 | Ht 68.0 in | Wt 148.0 lb

## 2011-11-08 DIAGNOSIS — I4892 Unspecified atrial flutter: Secondary | ICD-10-CM

## 2011-11-08 DIAGNOSIS — E785 Hyperlipidemia, unspecified: Secondary | ICD-10-CM

## 2011-11-08 NOTE — Patient Instructions (Signed)
Your physician wants you to follow-up in: 6 MONTHS WITH DR CRENSHAW You will receive a reminder letter in the mail two months in advance. If you don't receive a letter, please call our office to schedule the follow-up appointment.   Your physician has requested that you have an echocardiogram. Echocardiography is a painless test that uses sound waves to create images of your heart. It provides your doctor with information about the size and shape of your heart and how well your heart's chambers and valves are working. This procedure takes approximately one hour. There are no restrictions for this procedure.   Your physician recommends that you HAVE LAB WORK TODAY 

## 2011-11-08 NOTE — Assessment & Plan Note (Signed)
Patient remains in sinus rhythm. Embolic risk factors include prior TIA in age greater than 39. Continue Coumadin. Check INR today and then followup in [facility] for PT checks. Continue Cardizem. Check echocardiogram and TSH. If episodes become more frequent in the future we'll consider antiarrhythmic. Previous rhythm disturbance but difficult to ablate.

## 2011-11-08 NOTE — Assessment & Plan Note (Signed)
Management per primary care. 

## 2011-11-08 NOTE — Progress Notes (Signed)
   HPI: Patient previously admitted to [facility] with chest pain and atrial flutter also symptoms of a TIA. Echocardiogram showed normal function. TSH normal. Cardiac cath revealed an ejection fraction of 45%, minor nonobstructive coronary disease. Patient placed on the rate controlling medications and Coumadin. Had DCCV of atrial flutter on Sep 16 2010. Atrial flutter was felt not easily amenable to ablation. Patient seen in June with recurrent atrial flutter. Had repeat cardioversion June 2013. Since then, the patient denies any dyspnea on exertion, orthopnea, PND, pedal edema, palpitations, syncope or chest pain.    Current Outpatient Prescriptions  Medication Sig Dispense Refill  . diltiazem (CARDIZEM CD) 240 MG 24 hr capsule Take 240 mg by mouth daily.      . Multiple Vitamin (MULTIVITAMIN WITH MINERALS) TABS Take 1 tablet by mouth daily.      [name] warfarin (COUMADIN) 5 MG tablet Take 2.5-5 mg by mouth daily. Take 5 MG on Sundays, Tuesdays, Thursdays, and Fridays Take 2.5 MG on Mondays, Wednesdays, and Saturdays.         Past Medical History  Diagnosis Date  . Prostate cancer   . Hard of hearing   . Vertigo   . Atypical atrial flutter     a. Dx 07/2010 and coumadin initiated;  b. 08/2010 s/p DCCV.  [name] TIA (transient ischemic attack)   . Midsternal chest pain     a. In setting of A Flutter 07/2010;  b. Cath 07/2010: nonobs dzs  . Anticoagulated on Coumadin     a. Followed @ [facility]    Past Surgical History  Procedure Date  . Appendectomy   . Tonsillectomy   . Cardioversion 10/14/2011    Procedure: CARDIOVERSION;  Surgeon: Lelon Perla, MD;  Location: [facility] OR;  Service: Cardiovascular;  Laterality: N/A;    History   Social History  . Marital Status: Widowed    Spouse Name: N/A    Number of Children: N/A  . Years of Education: N/A   Occupational History  . Not on file.   Social History Main Topics  . Smoking status: Never Smoker   . Smokeless tobacco: Not on file    . Alcohol Use: No  . Drug Use: No  . Sexually Active: Not on file   Other Topics Concern  . Not on file   Social History Narrative   Lives in [place] by himself.  Children live nearby and check in on him daily.    ROS: no fevers or chills, productive cough, hemoptysis, dysphasia, odynophagia, melena, hematochezia, dysuria, hematuria, rash, seizure activity, orthopnea, PND, pedal edema, claudication. Remaining systems are negative.  Physical Exam: Well-developed well-nourished in no acute distress.  Skin is warm and dry.  HEENT is normal.  Neck is supple.  Chest is clear to auscultation with normal expansion.  Cardiovascular exam is regular rate and rhythm.  Abdominal exam nontender or distended. No masses palpated. Extremities show no edema. neuro grossly intact  ECG sinus rhythm at a rate of 74. Axis normal. No ST changes.

## 2011-11-09 LAB — PROTIME-INR
INR: 2.4 ratio — ABNORMAL HIGH (ref 0.8–1.0)
Prothrombin Time: 26.4 s — ABNORMAL HIGH (ref 10.2–12.4)

## 2011-11-09 LAB — TSH: TSH: 1.64 u[IU]/mL (ref 0.35–5.50)

## 2011-11-16 ENCOUNTER — Ambulatory Visit (HOSPITAL_COMMUNITY): Payer: Medicare Other | Attending: Cardiovascular Disease | Admitting: Radiology

## 2011-11-16 DIAGNOSIS — E785 Hyperlipidemia, unspecified: Secondary | ICD-10-CM

## 2011-11-16 DIAGNOSIS — I059 Rheumatic mitral valve disease, unspecified: Secondary | ICD-10-CM | POA: Insufficient documentation

## 2011-11-16 DIAGNOSIS — I4892 Unspecified atrial flutter: Secondary | ICD-10-CM | POA: Insufficient documentation

## 2011-11-16 DIAGNOSIS — I079 Rheumatic tricuspid valve disease, unspecified: Secondary | ICD-10-CM | POA: Insufficient documentation

## 2011-11-16 NOTE — Progress Notes (Signed)
Echocardiogram performed.  

## 2012-04-13 ENCOUNTER — Encounter: Payer: Self-pay | Admitting: Cardiology

## 2012-05-15 ENCOUNTER — Encounter: Payer: Self-pay | Admitting: Cardiology

## 2012-05-15 ENCOUNTER — Telehealth: Payer: Self-pay | Admitting: Cardiology

## 2012-05-15 ENCOUNTER — Ambulatory Visit (INDEPENDENT_AMBULATORY_CARE_PROVIDER_SITE_OTHER): Payer: Medicare Other | Admitting: Cardiology

## 2012-05-15 VITALS — BP 130/60 | HR 127 | Ht 68.0 in | Wt 153.0 lb

## 2012-05-15 DIAGNOSIS — E785 Hyperlipidemia, unspecified: Secondary | ICD-10-CM

## 2012-05-15 DIAGNOSIS — I4892 Unspecified atrial flutter: Secondary | ICD-10-CM

## 2012-05-15 MED ORDER — AMIODARONE HCL 400 MG PO TABS: ORAL_TABLET | ORAL | Status: DC

## 2012-05-15 NOTE — Assessment & Plan Note (Signed)
Management per primary care. 

## 2012-05-15 NOTE — Assessment & Plan Note (Signed)
Patient is in recurrent atrial flutter - asymptomatic. Embolic risk factors include prior TIA and age greater than 52. Continue Coumadin. Patient instructed on new anti-coagulation agents and he will contact us if he would like to consider this in the future. Continue Cardizem. Patient will need an antiarrhythmic to maintain sinus rhythm. I will add amiodarone both for rate control and hopefully to maintain sinus rhythm in the future if repeat cardioversion required. Previous rhythm disturbance felt difficult to ablate. Note given the addition of amiodarone I would like for him to have his INR checked with his primary care in one week and his Coumadin dose will most likely need to be decreased.

## 2012-05-15 NOTE — Telephone Encounter (Signed)
Left message for pharm, aware of interaction and still like to proceed.

## 2012-05-15 NOTE — Patient Instructions (Addendum)
Your physician recommends that you schedule a follow-up appointment in: 4 WEEKS WITH DR CRENSHAW  START AMIODARONE 400 MG ONE TABLET TWICE DAILY X ONE WEEK THEN 400 MG ONCE DAILY X THREE WEEKS THEN 200 MG ONCE DAILY  COUMADIN CHECK TODAY AND IN ONE WEEK.

## 2012-05-15 NOTE — Progress Notes (Signed)
   HPI: Pleasant male for fu of atrial flutter. Patient previously admitted to [facility] with chest pain and atrial flutter also symptoms of a TIA. Echocardiogram showed normal function. TSH normal. Cardiac cath revealed an ejection fraction of 45%, minor nonobstructive coronary disease. Patient placed on the rate controlling medications and Coumadin. Had DCCV of atrial flutter on Sep 16 2010. Atrial flutter was felt not easily amenable to ablation. Patient seen in June with recurrent atrial flutter. Had repeat cardioversion June 2013. Echo July of 2013 showed normal LV function, mild LVH, grade 1 diastolic dysfunction, mild MR, mild to moderate RAE. TSH 1.64. Patient last seen in July of 2013. Since then, the patient denies any dyspnea on exertion, orthopnea, PND, pedal edema, palpitations, syncope or chest pain.     Current Outpatient Prescriptions  Medication Sig Dispense Refill  . amiodarone (PACERONE) 200 MG tablet Take 200 mg by mouth daily.      . Multiple Vitamin (MULTIVITAMIN) capsule Take 1 capsule by mouth daily.      [name] warfarin (COUMADIN) 5 MG tablet Take 5 mg by mouth as directed.         Past Medical History  Diagnosis Date  . Prostate cancer   . Hard of hearing   . Vertigo   . Atypical atrial flutter     a. Dx 07/2010 and coumadin initiated;  b. 08/2010 s/p DCCV.  [name] TIA (transient ischemic attack)   . Midsternal chest pain     a. In setting of A Flutter 07/2010;  b. Cath 07/2010: nonobs dzs  . Anticoagulated on Coumadin     a. Followed @ [facility]    Past Surgical History  Procedure Date  . Appendectomy   . Tonsillectomy   . Cardioversion 10/14/2011    Procedure: CARDIOVERSION;  Surgeon: Lelon Perla, MD;  Location: [facility] OR;  Service: Cardiovascular;  Laterality: N/A;    History   Social History  . Marital Status: Widowed    Spouse Name: N/A    Number of Children: N/A  . Years of Education: N/A   Occupational History  . Not on file.   Social History  Main Topics  . Smoking status: Never Smoker   . Smokeless tobacco: Not on file  . Alcohol Use: No  . Drug Use: No  . Sexually Active: Not on file   Other Topics Concern  . Not on file   Social History Narrative   Lives in [place] by himself.  Children live nearby and check in on him daily.    ROS: no fevers or chills, productive cough, hemoptysis, dysphasia, odynophagia, melena, hematochezia, dysuria, hematuria, rash, seizure activity, orthopnea, PND, pedal edema, claudication. Remaining systems are negative.  Physical Exam: Well-developed well-nourished in no acute distress.  Skin is warm and dry.  HEENT is normal.  Neck is supple.  Chest is clear to auscultation with normal expansion.  Cardiovascular exam is tachycardic Abdominal exam nontender or distended. No masses palpated. Extremities show no edema. neuro grossly intact  ECG atrial flutter at a rate of 127.

## 2012-05-15 NOTE — Telephone Encounter (Signed)
New Problem:     Called in because there is a severe drug reaction between the patient's amiodarone (PACERONE) 400 MG tablet and diltiazem (DILACOR XR) 240 MG 24 hr capsule.  Please call back.

## 2012-05-16 LAB — PROTIME-INR: Prothrombin Time: 30.7 s — ABNORMAL HIGH (ref 10.2–12.4)

## 2012-06-14 ENCOUNTER — Encounter: Payer: Self-pay | Admitting: Cardiology

## 2012-06-14 ENCOUNTER — Ambulatory Visit (INDEPENDENT_AMBULATORY_CARE_PROVIDER_SITE_OTHER): Payer: Medicare Other | Admitting: Cardiology

## 2012-06-14 VITALS — BP 142/80 | HR 99 | Wt 155.0 lb

## 2012-06-14 DIAGNOSIS — E785 Hyperlipidemia, unspecified: Secondary | ICD-10-CM

## 2012-06-14 DIAGNOSIS — I4892 Unspecified atrial flutter: Secondary | ICD-10-CM

## 2012-06-14 LAB — PROTIME-INR: INR: 1.7 ratio — ABNORMAL HIGH (ref 0.8–1.0)

## 2012-06-14 MED ORDER — AMIODARONE HCL 200 MG PO TABS: 200.0000 mg | ORAL_TABLET | Freq: Every day | ORAL | Status: DC

## 2012-06-14 NOTE — Assessment & Plan Note (Signed)
Management per primary care. 

## 2012-06-14 NOTE — Patient Instructions (Addendum)
Your physician recommends that you schedule a follow-up appointment in: [place] AMIODARONE TO [place] physician recommends that you HAVE LAB [place] physician has recommended that you have a Cardioversion (DCCV). Electrical Cardioversion uses a jolt of electricity to your heart either through paddles or wired patches attached to your chest. This is a controlled, usually prescheduled, procedure. Defibrillation is done under light anesthesia in the hospital, and you usually go home the day of the procedure. This is done to get your heart back into a normal rhythm. You are not awake for the procedure. Please see the instruction sheet given to you today.

## 2012-06-14 NOTE — Assessment & Plan Note (Signed)
Patient remains in recurrent atrial flutter - asymptomatic. Embolic risk factors include prior TIA and age greater than 41. Continue Coumadin. Continue Cardizem. Patient will need an antiarrhythmic to maintain sinus rhythm. Amiodarone added at last visit. Decrease dose to 200 mg daily. Check TSH, liver functions and chest x-ray when he returns in 8 weeks. Previous rhythm disturbance felt difficult to ablate. We will obtain records concerning his recent INRs and also check that today. If he has been greater then 2 for 3 consecutive weeks we will arrange cardioversion. My hope is that amiodarone will help him maintain sinus rhythm.

## 2012-06-14 NOTE — Progress Notes (Signed)
   HPI: Pleasant male for fu of atrial flutter. Patient previously admitted to [facility] with chest pain and atrial flutter; also symptoms of a TIA. Echocardiogram showed normal function. TSH normal. Cardiac cath revealed an ejection fraction of 45%, minor nonobstructive coronary disease. Patient placed on rate controlling medications and Coumadin. Had DCCV of atrial flutter on Sep 16 2010. Atrial flutter was felt not easily amenable to ablation. Patient seen in June with recurrent atrial flutter. Had repeat cardioversion June 2013. Echo July of 2013 showed normal LV function, mild LVH, grade 1 diastolic dysfunction, mild MR, mild to moderate RAE. TSH 1.64. Patient seen again in Jan 2014 and in recurrent flutter. Placed on amiodarone. Since then, the patient denies any dyspnea on exertion, orthopnea, PND, pedal edema, palpitations, syncope or chest pain.    Current Outpatient Prescriptions  Medication Sig Dispense Refill  . amiodarone (PACERONE) 400 MG tablet Take 400 mg by mouth daily.      [name] diltiazem (DILACOR XR) 240 MG 24 hr capsule Take 240 mg by mouth daily.      [name] warfarin (COUMADIN) 5 MG tablet Take 5 mg by mouth as directed.       No current facility-administered medications for this visit.     Past Medical History  Diagnosis Date  . Prostate cancer   . Hard of hearing   . Vertigo   . Atypical atrial flutter     a. Dx 07/2010 and coumadin initiated;  b. 08/2010 s/p DCCV.  [name] TIA (transient ischemic attack)   . Midsternal chest pain     a. In setting of A Flutter 07/2010;  b. Cath 07/2010: nonobs dzs  . Anticoagulated on Coumadin     a. Followed @ [facility]    Past Surgical History  Procedure Laterality Date  . Appendectomy    . Tonsillectomy    . Cardioversion  10/14/2011    Procedure: CARDIOVERSION;  Surgeon: Lelon Perla, MD;  Location: [facility] OR;  Service: Cardiovascular;  Laterality: N/A;    History   Social History  . Marital Status: Widowed    Spouse Name: N/A      Number of Children: N/A  . Years of Education: N/A   Occupational History  . Not on file.   Social History Main Topics  . Smoking status: Never Smoker   . Smokeless tobacco: Not on file  . Alcohol Use: No  . Drug Use: No  . Sexually Active: Not on file   Other Topics Concern  . Not on file   Social History Narrative   Lives in [place] by himself.  Children live nearby and check in on him daily.    ROS: no fevers or chills, productive cough, hemoptysis, dysphasia, odynophagia, melena, hematochezia, dysuria, hematuria, rash, seizure activity, orthopnea, PND, pedal edema, claudication. Remaining systems are negative.  Physical Exam: Well-developed well-nourished in no acute distress.  Skin is warm and dry.  HEENT is normal.  Neck is supple.  Chest is clear to auscultation with normal expansion.  Cardiovascular exam is irregular Abdominal exam nontender or distended. No masses palpated. Extremities show no edema. neuro grossly intact  ECG atrial flutter at a rate of 99. Occasional PVCs or aberrantly conducted beats. No ST changes.

## 2012-07-05 ENCOUNTER — Ambulatory Visit (INDEPENDENT_AMBULATORY_CARE_PROVIDER_SITE_OTHER): Payer: Medicare Other | Admitting: Pharmacist

## 2012-07-05 DIAGNOSIS — I4891 Unspecified atrial fibrillation: Secondary | ICD-10-CM

## 2012-07-05 LAB — POCT INR: INR: 2.7

## 2012-07-12 ENCOUNTER — Ambulatory Visit (INDEPENDENT_AMBULATORY_CARE_PROVIDER_SITE_OTHER): Payer: Medicare Other | Admitting: Pharmacist

## 2012-07-12 ENCOUNTER — Encounter: Payer: Self-pay | Admitting: Cardiology

## 2012-07-12 ENCOUNTER — Telehealth: Payer: Self-pay | Admitting: Cardiology

## 2012-07-12 DIAGNOSIS — I4891 Unspecified atrial fibrillation: Secondary | ICD-10-CM

## 2012-07-12 LAB — PROTIME-INR

## 2012-07-12 LAB — POCT INR: INR: 2.4

## 2012-07-12 NOTE — Telephone Encounter (Signed)
New problem    Tammy want to let you know she will be faxing you over the pt's pro-time for his cardioversion.

## 2012-07-16 NOTE — Telephone Encounter (Signed)
Pt's son Aakarsh Cutter called to have scheduled the cardioversion for pt. Pt's son is aware that Dr. Stanford Breed and his  nurse will be the office tomorrow. Pt's son states it will be fine.

## 2012-07-16 NOTE — Telephone Encounter (Signed)
Follow up call    Calling to set up procedure

## 2012-07-17 ENCOUNTER — Other Ambulatory Visit: Payer: Self-pay | Admitting: Pharmacist

## 2012-07-17 ENCOUNTER — Encounter: Payer: Self-pay | Admitting: *Deleted

## 2012-07-17 DIAGNOSIS — I4891 Unspecified atrial fibrillation: Secondary | ICD-10-CM

## 2012-07-17 NOTE — Telephone Encounter (Signed)
Spoke with pt son, DCCV is scheduled for Tuesday 07-24-12 @ 12 noon. He will go to [facility] on Monday 07-23-12 for protime, bmp and cbc. tammy from that office will call and schedule protime appt for Monday. Instructions discussed with son over the phone and letter mailed to pt home address.

## 2012-07-23 ENCOUNTER — Ambulatory Visit (INDEPENDENT_AMBULATORY_CARE_PROVIDER_SITE_OTHER): Payer: Medicare Other | Admitting: Pharmacist

## 2012-07-23 DIAGNOSIS — I4891 Unspecified atrial fibrillation: Secondary | ICD-10-CM

## 2012-07-23 LAB — BASIC METABOLIC PANEL
BUN: 21 mg/dL (ref 6–23)
Calcium: 9.1 mg/dL (ref 8.4–10.5)
Creat: 0.93 mg/dL (ref 0.50–1.35)

## 2012-07-23 LAB — POCT CBC
Lymph, poc: 1 (ref 0.6–3.4)
MCH, POC: 31.7 pg — AB (ref 27–31.2)
MCV: 95.9 fL (ref 80–97)
Platelet Count, POC: 165 10*3/uL (ref 142–424)
RBC: 5.2 M/uL (ref 4.69–6.13)
RDW, POC: 15.4 %
WBC: 5.8 10*3/uL (ref 4.6–10.2)

## 2012-07-23 LAB — POCT INR: INR: 3.2

## 2012-07-24 ENCOUNTER — Ambulatory Visit (HOSPITAL_COMMUNITY)
Admission: RE | Admit: 2012-07-24 | Discharge: 2012-07-24 | Disposition: A | Discharge status: Home / Self Care | Payer: Medicare Other | Source: Ambulatory Visit | Attending: Cardiology | Admitting: Cardiology

## 2012-07-24 ENCOUNTER — Encounter (HOSPITAL_COMMUNITY): Payer: Self-pay | Admitting: *Deleted

## 2012-07-24 ENCOUNTER — Encounter (HOSPITAL_COMMUNITY)
Admission: RE | Disposition: A | Discharge status: Home / Self Care | Payer: Self-pay | Source: Ambulatory Visit | Attending: Cardiology

## 2012-07-24 ENCOUNTER — Ambulatory Visit (HOSPITAL_COMMUNITY): Payer: Medicare Other | Admitting: *Deleted

## 2012-07-24 DIAGNOSIS — I519 Heart disease, unspecified: Secondary | ICD-10-CM | POA: Insufficient documentation

## 2012-07-24 DIAGNOSIS — Z7901 Long term (current) use of anticoagulants: Secondary | ICD-10-CM | POA: Insufficient documentation

## 2012-07-24 DIAGNOSIS — I4892 Unspecified atrial flutter: Secondary | ICD-10-CM | POA: Insufficient documentation

## 2012-07-24 DIAGNOSIS — I517 Cardiomegaly: Secondary | ICD-10-CM | POA: Insufficient documentation

## 2012-07-24 DIAGNOSIS — Z79899 Other long term (current) drug therapy: Secondary | ICD-10-CM | POA: Insufficient documentation

## 2012-07-24 DIAGNOSIS — I4891 Unspecified atrial fibrillation: Secondary | ICD-10-CM | POA: Insufficient documentation

## 2012-07-24 HISTORY — PX: CARDIOVERSION: SHX1299

## 2012-07-24 SURGERY — CARDIOVERSION
Anesthesia: Monitor Anesthesia Care

## 2012-07-24 MED ORDER — SODIUM CHLORIDE 0.9 % IV SOLN: INTRAVENOUS | Status: DC

## 2012-07-24 NOTE — Transfer of Care (Signed)
Immediate Anesthesia Transfer of Care Note  Patient: Larry Gates  Procedure(s) Performed: Procedure(s): CARDIOVERSION (N/A)  Patient Location: PACU and Endoscopy Unit  Anesthesia Type:General  Level of Consciousness: awake, alert  and oriented  Airway & Oxygen Therapy: Patient Spontanous Breathing and Patient connected to nasal cannula oxygen  Post-op Assessment: Report given to PACU RN and Post -op Vital signs reviewed and stable  Post vital signs: Reviewed and stable  Complications: No apparent anesthesia complications

## 2012-07-24 NOTE — Anesthesia Postprocedure Evaluation (Signed)
  Anesthesia Post-op Note  Patient: Larry Gates  Procedure(s) Performed: Procedure(s): CARDIOVERSION (N/A)  Patient Location: Endoscopy Unit  Anesthesia Type:General  Level of Consciousness: awake  Airway and Oxygen Therapy: Patient Spontanous Breathing  Post-op Pain: none  Post-op Assessment: Post-op Vital signs reviewed, Patient's Cardiovascular Status Stable, Respiratory Function Stable and Patent Airway  Post-op Vital Signs: Reviewed and stable  Complications: No apparent anesthesia complications

## 2012-07-24 NOTE — H&P (Signed)
HPI: Pleasant male for fu of atrial flutter. Patient previously admitted to [facility] with chest pain and atrial flutter; also symptoms of a TIA. Echocardiogram showed normal function. TSH normal. Cardiac cath revealed an ejection fraction of 45%, minor nonobstructive coronary disease. Patient placed on rate controlling medications and Coumadin. Had DCCV of atrial flutter on Sep 16 2010. Atrial flutter was felt not easily amenable to ablation. Patient seen in June with recurrent atrial flutter. Had repeat cardioversion June 2013. Echo July of 2013 showed normal LV function, mild LVH, grade 1 diastolic dysfunction, mild MR, mild to moderate RAE. TSH 1.64. Patient seen again in Jan 2014 and in recurrent flutter. Placed on amiodarone. Since then, the patient denies any dyspnea on exertion, orthopnea, PND, pedal edema, palpitations, syncope or chest pain. The patient has been therapeutic on warfarin for more than 3 consecutive weeks. Presents now for elective cardioversion. His EF is 55-60% by echo.

## 2012-07-24 NOTE — CV Procedure (Signed)
Electrical Cardioversion Procedure Note Larry Gates [id number] 1928-10-11  Procedure: Electrical Cardioversion Indications:  Atrial Flutter  Procedure Details Consent: Risks of procedure as well as the alternatives and risks of each were explained to the (patient/caregiver).  Consent for procedure obtained. Time Out: Verified patient identification, verified procedure, site/side was marked, verified correct patient position, special equipment/implants available, medications/allergies/relevent history reviewed, required imaging and test results available.  Performed  Patient placed on cardiac monitor, pulse oximetry, supplemental oxygen as necessary.  Sedation given: propofol Pacer pads placed anterior and posterior chest.  Cardioverted 1 time(s).  Cardioverted at 120J.  Evaluation Findings: Post procedure EKG shows: NSR Complications: None Patient did tolerate procedure well.   Larry Gates 07/24/2012, 12:23 PM

## 2012-07-24 NOTE — Preoperative (Signed)
Beta Blockers   Reason not to administer Beta Blockers:Not Applicable 

## 2012-07-24 NOTE — Anesthesia Preprocedure Evaluation (Addendum)
Anesthesia Evaluation  Patient identified by MRN, date of birth, ID band Patient awake    Reviewed: Allergy & Precautions, H&P , NPO status , Patient's Chart, lab work & pertinent test results, reviewed documented beta blocker date and time   Airway Mallampati: II TM Distance: >3 FB Neck ROM: Full    Dental  (+) Edentulous Upper   Pulmonary          Cardiovascular + dysrhythmias Atrial Fibrillation     Neuro/Psych TIA   GI/Hepatic   Endo/Other    Renal/GU      Musculoskeletal   Abdominal   Peds  Hematology   Anesthesia Other Findings   Reproductive/Obstetrics                           Anesthesia Physical Anesthesia Plan  ASA: III  Anesthesia Plan: MAC   Post-op Pain Management:    Induction: Intravenous  Airway Management Planned: Mask  Additional Equipment:   Intra-op Plan:   Post-operative Plan:   Informed Consent: I have reviewed the patients History and Physical, chart, labs and discussed the procedure including the risks, benefits and alternatives for the proposed anesthesia with the patient or authorized representative who has indicated his/her understanding and acceptance.   Dental advisory given  Plan Discussed with: CRNA, Anesthesiologist and Surgeon  Anesthesia Plan Comments:         Anesthesia Quick Evaluation

## 2012-07-25 ENCOUNTER — Encounter (HOSPITAL_COMMUNITY): Payer: Self-pay | Admitting: Cardiology

## 2012-07-26 ENCOUNTER — Encounter: Payer: Self-pay | Admitting: *Deleted

## 2012-08-09 ENCOUNTER — Encounter: Payer: Self-pay | Admitting: Cardiology

## 2012-08-09 ENCOUNTER — Ambulatory Visit (INDEPENDENT_AMBULATORY_CARE_PROVIDER_SITE_OTHER): Payer: Medicare Other | Admitting: Cardiology

## 2012-08-09 ENCOUNTER — Ambulatory Visit (INDEPENDENT_AMBULATORY_CARE_PROVIDER_SITE_OTHER)
Admission: RE | Admit: 2012-08-09 | Discharge: 2012-08-09 | Disposition: A | Discharge status: Home / Self Care | Payer: Medicare Other | Source: Ambulatory Visit | Attending: Cardiology | Admitting: Cardiology

## 2012-08-09 VITALS — BP 153/79 | HR 69 | Wt 155.0 lb

## 2012-08-09 DIAGNOSIS — E785 Hyperlipidemia, unspecified: Secondary | ICD-10-CM

## 2012-08-09 DIAGNOSIS — I4892 Unspecified atrial flutter: Secondary | ICD-10-CM

## 2012-08-09 DIAGNOSIS — I10 Essential (primary) hypertension: Secondary | ICD-10-CM

## 2012-08-09 LAB — HEPATIC FUNCTION PANEL
Albumin: 4 g/dL (ref 3.5–5.2)
Total Protein: 6.8 g/dL (ref 6.0–8.3)

## 2012-08-09 NOTE — Assessment & Plan Note (Signed)
Patient remains in sinus rhythm status post cardioversion. Continue amiodarone. Check TSH, liver functions and chest x-ray. Continue Coumadin.

## 2012-08-09 NOTE — Assessment & Plan Note (Signed)
Management per primary care. 

## 2012-08-09 NOTE — Progress Notes (Signed)
   HPI: Pleasant male for fu of atrial flutter. Patient previously admitted to [facility] with chest pain and atrial flutter; also symptoms of a TIA. Echocardiogram showed normal function. TSH normal. Cardiac cath revealed an ejection fraction of 45%, minor nonobstructive coronary disease. Patient placed on rate controlling medications and Coumadin. Had DCCV of atrial flutter on Sep 16 2010. Atrial flutter was felt not easily amenable to ablation. Echo July of 2013 showed normal LV function, mild LVH, grade 1 diastolic dysfunction, mild MR, mild to moderate RAE. TSH 1.64. Patient seen again in Jan 2014 and in recurrent flutter. Placed on amiodarone. DCCV repeated 4/14. Since then, the patient denies any dyspnea on exertion, orthopnea, PND, pedal edema, palpitations, syncope or chest pain.   Current Outpatient Prescriptions  Medication Sig Dispense Refill  . amiodarone (PACERONE) 200 MG tablet Take 1 tablet (200 mg total) by mouth daily.      [name] diltiazem (DILACOR XR) 240 MG 24 hr capsule Take 240 mg by mouth daily.      . fluoruracil (CARAC) 0.5 % cream Apply topically as directed.      . warfarin (COUMADIN) 5 MG tablet Take 5 mg by mouth as directed.       No current facility-administered medications for this visit.     Past Medical History  Diagnosis Date  . Prostate cancer   . Hard of hearing   . Vertigo   . Atypical atrial flutter     a. Dx 07/2010 and coumadin initiated;  b. 08/2010 s/p DCCV.  [name] TIA (transient ischemic attack)   . Midsternal chest pain     a. In setting of A Flutter 07/2010;  b. Cath 07/2010: nonobs dzs  . Anticoagulated on Coumadin     a. Followed @ [facility]    Past Surgical History  Procedure Laterality Date  . Appendectomy    . Tonsillectomy    . Cardioversion  10/14/2011    Procedure: CARDIOVERSION;  Surgeon: Lelon Perla, MD;  Location: [place];  Service: Cardiovascular;  Laterality: N/A;  . Cardioversion N/A 07/24/2012    Procedure: CARDIOVERSION;   Surgeon: [name], MD;  Location: [facility] ENDOSCOPY;  Service: Cardiovascular;  Laterality: N/A;    History   Social History  . Marital Status: Widowed    Spouse Name: N/A    Number of Children: N/A  . Years of Education: N/A   Occupational History  . Not on file.   Social History Main Topics  . Smoking status: Never Smoker   . Smokeless tobacco: Not on file  . Alcohol Use: No  . Drug Use: No  . Sexually Active: Not on file   Other Topics Concern  . Not on file   Social History Narrative   Lives in [place] by himself.  Children live nearby and check in on him daily.    ROS: no fevers or chills, productive cough, hemoptysis, dysphasia, odynophagia, melena, hematochezia, dysuria, hematuria, rash, seizure activity, orthopnea, PND, pedal edema, claudication. Remaining systems are negative.  Physical Exam: Well-developed well-nourished in no acute distress.  Skin is warm and dry.  HEENT is normal.  Neck is supple.  Chest is clear to auscultation with normal expansion.  Cardiovascular exam is regular rate and rhythm.  Abdominal exam nontender or distended. No masses palpated. Extremities show no edema. neuro grossly intact  ECG sinus rhythm at a rate of 69. No ST changes.

## 2012-08-09 NOTE — Assessment & Plan Note (Signed)
Continue present medications. 

## 2012-08-09 NOTE — Patient Instructions (Addendum)
Your physician wants you to follow-up in: [place] will receive a reminder letter in the mail two months in advance. If you don't receive a letter, please call our office to schedule the follow-up appointment.   A chest x-ray takes a picture of the organs and structures inside the chest, including the heart, lungs, and blood vessels. This test can show several things, including, whether the heart is enlarges; whether fluid is building up in the lungs; and whether pacemaker / defibrillator leads are still in place. AT [place] OFFICE  Your physician recommends that you HAVE LAB WORK TODAY

## 2012-08-20 ENCOUNTER — Telehealth: Payer: Self-pay | Admitting: Pharmacist

## 2012-08-21 NOTE — Telephone Encounter (Signed)
appt made for 08/23/12 at 3:40pm

## 2012-09-24 ENCOUNTER — Ambulatory Visit (INDEPENDENT_AMBULATORY_CARE_PROVIDER_SITE_OTHER): Payer: Medicare Other | Admitting: Pharmacist

## 2012-09-24 DIAGNOSIS — I4891 Unspecified atrial fibrillation: Secondary | ICD-10-CM

## 2012-09-24 LAB — POCT INR: INR: 2.6

## 2012-09-24 NOTE — Patient Instructions (Signed)
Anticoagulation Dose Instructions as of 09/24/2012     Larry Gates Tue Wed Thu Fri Sat   New Dose 2.5 mg 1 mg 2.5 mg 2.5 mg 2.5 mg 1 mg 2.5 mg    Description       Continue same dose.       INR was 2.6 today

## 2012-09-28 ENCOUNTER — Telehealth: Payer: Self-pay | Admitting: Cardiology

## 2012-09-28 NOTE — Telephone Encounter (Signed)
New Prob     Has some questions regarding medication. States pt is having some side effects. Please call.

## 2012-09-28 NOTE — Telephone Encounter (Signed)
Per family member - pt has been unsteady on his feet - he reports since starting on amiodarone in 05/2012.  Family states they have noticed it recently and they have been monitoring his BP and HR. They report his BP has been OK or high but not low.  The unsteadiness doesn't occur with position changes or when he is first getting up.  They can be thru out the day.  Advised probably not related to medications.  Advised to have pt evaluated by his PCP.  She is in agreement.

## 2012-10-06 ENCOUNTER — Other Ambulatory Visit: Payer: Self-pay | Admitting: Nurse Practitioner

## 2012-11-06 ENCOUNTER — Ambulatory Visit (INDEPENDENT_AMBULATORY_CARE_PROVIDER_SITE_OTHER): Payer: Medicare Other | Admitting: Pharmacist Clinician (PhC)/ Clinical Pharmacy Specialist

## 2012-11-06 DIAGNOSIS — I4891 Unspecified atrial fibrillation: Secondary | ICD-10-CM

## 2012-11-06 LAB — POCT INR: INR: 2.1

## 2012-11-20 ENCOUNTER — Other Ambulatory Visit (INDEPENDENT_AMBULATORY_CARE_PROVIDER_SITE_OTHER): Payer: Medicare Other

## 2012-11-20 DIAGNOSIS — C61 Malignant neoplasm of prostate: Secondary | ICD-10-CM

## 2012-11-20 NOTE — Progress Notes (Signed)
Patient came in with order for PSA from Dr.Wrenn

## 2012-11-21 LAB — PSA, TOTAL AND FREE
PSA, Free Pct: 15 %
PSA, Free: 0.15 ng/mL

## 2013-01-07 ENCOUNTER — Other Ambulatory Visit: Payer: Self-pay

## 2013-01-07 NOTE — Telephone Encounter (Signed)
Last PT 11/06/12  No physician visit in [facility]

## 2013-01-08 ENCOUNTER — Ambulatory Visit (INDEPENDENT_AMBULATORY_CARE_PROVIDER_SITE_OTHER): Payer: Medicare Other | Admitting: Pharmacist Clinician (PhC)/ Clinical Pharmacy Specialist

## 2013-01-08 DIAGNOSIS — Z23 Encounter for immunization: Secondary | ICD-10-CM

## 2013-01-08 DIAGNOSIS — I4891 Unspecified atrial fibrillation: Secondary | ICD-10-CM

## 2013-01-08 LAB — POCT INR: INR: 2.1

## 2013-01-08 NOTE — Addendum Note (Signed)
Addended by: Louann Sjogren A on: 01/08/2013 08:41 AM   Modules accepted: Orders

## 2013-01-09 MED ORDER — WARFARIN SODIUM 5 MG PO TABS: ORAL_TABLET | ORAL | Status: DC

## 2013-02-02 ENCOUNTER — Emergency Department (HOSPITAL_COMMUNITY): Payer: Medicare Other

## 2013-02-02 ENCOUNTER — Emergency Department (HOSPITAL_COMMUNITY)
Admission: EM | Admit: 2013-02-02 | Discharge: 2013-02-02 | Disposition: A | Discharge status: Home / Self Care | Payer: Medicare Other | Attending: Emergency Medicine | Admitting: Emergency Medicine

## 2013-02-02 ENCOUNTER — Encounter (HOSPITAL_COMMUNITY): Payer: Self-pay | Admitting: Emergency Medicine

## 2013-02-02 DIAGNOSIS — Z7901 Long term (current) use of anticoagulants: Secondary | ICD-10-CM | POA: Insufficient documentation

## 2013-02-02 DIAGNOSIS — S60221A Contusion of right hand, initial encounter: Secondary | ICD-10-CM

## 2013-02-02 DIAGNOSIS — W010XXA Fall on same level from slipping, tripping and stumbling without subsequent striking against object, initial encounter: Secondary | ICD-10-CM | POA: Insufficient documentation

## 2013-02-02 DIAGNOSIS — S0990XA Unspecified injury of head, initial encounter: Secondary | ICD-10-CM | POA: Insufficient documentation

## 2013-02-02 DIAGNOSIS — Z8673 Personal history of transient ischemic attack (TIA), and cerebral infarction without residual deficits: Secondary | ICD-10-CM | POA: Insufficient documentation

## 2013-02-02 DIAGNOSIS — I4892 Unspecified atrial flutter: Secondary | ICD-10-CM | POA: Insufficient documentation

## 2013-02-02 DIAGNOSIS — Y92410 Unspecified street and highway as the place of occurrence of the external cause: Secondary | ICD-10-CM | POA: Insufficient documentation

## 2013-02-02 DIAGNOSIS — Z880 Allergy status to penicillin: Secondary | ICD-10-CM | POA: Insufficient documentation

## 2013-02-02 DIAGNOSIS — Y9301 Activity, walking, marching and hiking: Secondary | ICD-10-CM | POA: Insufficient documentation

## 2013-02-02 DIAGNOSIS — IMO0002 Reserved for concepts with insufficient information to code with codable children: Secondary | ICD-10-CM | POA: Insufficient documentation

## 2013-02-02 DIAGNOSIS — Z79899 Other long term (current) drug therapy: Secondary | ICD-10-CM | POA: Insufficient documentation

## 2013-02-02 DIAGNOSIS — Z8546 Personal history of malignant neoplasm of prostate: Secondary | ICD-10-CM | POA: Insufficient documentation

## 2013-02-02 DIAGNOSIS — S60229A Contusion of unspecified hand, initial encounter: Secondary | ICD-10-CM | POA: Insufficient documentation

## 2013-02-02 DIAGNOSIS — Z8669 Personal history of other diseases of the nervous system and sense organs: Secondary | ICD-10-CM | POA: Insufficient documentation

## 2013-02-02 LAB — PROTIME-INR
INR: 2.14 — ABNORMAL HIGH (ref 0.00–1.49)
Prothrombin Time: 23.2 seconds — ABNORMAL HIGH (ref 11.6–15.2)

## 2013-02-02 NOTE — ED Notes (Signed)
Pt reports tripping and falling on the road yesterday. Denies any loc, laceration to right eye brow, and right hand is swollen, unsure of last tetanus.

## 2013-02-02 NOTE — ED Provider Notes (Signed)
CSN: [id number]     Arrival date & time 02/02/13  1701 History  This chart was scribed for No att. providers found by Eugenia Mcalpine, ED Scribe. This patient was seen in room APA09/APA09 and the patient's care was started at 5:42 PM.    Chief Complaint  Patient presents with  . Fall  . Hand Pain   HPI  Pt complains of mild pain in his right hand, elbow and right temple with associated swelling to his right hand and abrasions to temple and elbow after a fall that occurred 1x day ago.  He was walking and did not see the corner and tripped, falling with an open palm onto his right hand and side. Pt denies LOC.  Pt has a hx of heart problems and is on blood thinners.  Past Medical History  Diagnosis Date  . Prostate cancer   . Hard of hearing   . Vertigo   . Atypical atrial flutter     a. Dx 07/2010 and coumadin initiated;  b. 08/2010 s/p DCCV.  [name] TIA (transient ischemic attack)   . Midsternal chest pain     a. In setting of A Flutter 07/2010;  b. Cath 07/2010: nonobs dzs  . Anticoagulated on Coumadin     a. Followed @ [facility]   Past Surgical History  Procedure Laterality Date  . Appendectomy    . Tonsillectomy    . Cardioversion  10/14/2011    Procedure: CARDIOVERSION;  Surgeon: Lelon Perla, MD;  Location: [place];  Service: Cardiovascular;  Laterality: N/A;  . Cardioversion N/A 07/24/2012    Procedure: CARDIOVERSION;  Surgeon: [name], MD;  Location[facility] ENDOSCOPY;  Service: Cardiovascular;  Laterality: N/A;   Family History  Problem Relation Age of Onset  . Stroke Father    History  Substance Use Topics  . Smoking status: Never Smoker   . Smokeless tobacco: Not on file  . Alcohol Use: No    Review of Systems  Allergies  Penicillins  Home Medications   Current Outpatient Rx  Name  Route  Sig  Dispense  Refill  . amiodarone (PACERONE) 200 MG tablet   Oral   Take 1 tablet (200 mg total) by mouth daily.         [name] diltiazem (DILACOR XR) 240 MG 24 hr capsule    Oral   Take 240 mg by mouth daily.         . Multiple Vitamin (MULTIVITAMIN WITH MINERALS) TABS tablet   Oral   Take 1 tablet by mouth daily.         [name] warfarin (COUMADIN) 5 MG tablet   Oral   Take 2.5-5 mg by mouth every morning. Takes one-half tablet for 5 days, then takes one whole tablet for 2 days.          BP 154/66  Pulse 68  Temp(Src) 98.3 F (36.8 C) (Oral)  Resp 18  Ht 5\' 8"  (1.727 m)  Wt 151 lb (68.493 kg)  BMI 22.96 kg/m2  SpO2 100% Physical Exam  Nursing note and vitals reviewed. Constitutional: He is oriented to person, place, and time. He appears well-developed and well-nourished.  HENT:  Head: Normocephalic.  Small abrasion right temple region   Eyes: EOM are normal.  Neck: Normal range of motion.  Full ROM  Pulmonary/Chest: Effort normal.  Abdominal: He exhibits no distension.  Musculoskeletal: Normal range of motion.  Tenderness of 2nd metacarpal. Tender of distal right 3rd metacarpal. Good right radial pulse.  ful ROM of right elbow. Small skin tear right lateral elbow laceration over the volar surface overlaying the distal 2nd and 3rd metacarpals   Lymphadenopathy:    He has no cervical adenopathy.  Neurological: He is alert and oriented to person, place, and time.  Psychiatric: He has a normal mood and affect.    ED Course  Procedures (including critical care time) DIAGNOSTIC STUDIES: Oxygen Saturation is 100% on RA, normal by my interpretation.    COORDINATION OF CARE:    5:48 PM- Pt advised of plan for treatment X-ray for his right hand and head and pt agrees.   Labs Review Labs Reviewed  PROTIME-INR - Abnormal; Notable for the following:    Prothrombin Time 23.2 (*)    INR 2.14 (*)    All other components within normal limits   Imaging Review Ct Head Wo Contrast  02/02/2013   CLINICAL DATA:  Fall, headache.  EXAM: CT HEAD WITHOUT CONTRAST  TECHNIQUE: Contiguous axial images were obtained from the base of the skull through the  vertex without intravenous contrast.  COMPARISON:  09/05/2010  FINDINGS: No acute intracranial abnormality. Specifically, no hemorrhage, hydrocephalus, mass lesion, acute infarction, or significant intracranial injury. No acute calvarial abnormality.  Opacified right maxillary sinus. Remainder the paranasal sinuses and mastoids are clear. Orbital soft tissues unremarkable.  IMPRESSION: No acute intracranial abnormality.   Electronically Signed   By: Rolm Baptise M.D.   On: 02/02/2013 18:11   Dg Hand Complete Right  02/02/2013   CLINICAL DATA:  Fall  EXAM: RIGHT HAND - COMPLETE 3+ VIEW  COMPARISON:  None.  FINDINGS: No acute fracture. No dislocation.  IMPRESSION: No acute bony pathology.   Electronically Signed   By: Maryclare Bean M.D.   On: 02/02/2013 18:06  I personally reviewed the imaging tests through PACS system I reviewed available ER/hospitalization records through the EMR    EKG Interpretation   None       MDM   1. Hand contusion, right, initial encounter   2. Minor head injury, initial encounter    Plain films of her right hand without fracture.  He does have some swelling of the posterior aspect of his right hand.  I suspect the majority of this is isolated hematoma given his use of Coumadin.  Small laceration of his volar surface of his right hand does not need to be repaired.  This will heal by secondary intention.  Infection warnings given.  At this time my suspicion for infection is low.  R. told that the erythema warmth or tenderness seems to worsen he'll need to return to the ER for repeat evaluation.  C-spine nontender.  Head CT normal.  Chest and abdomen benign.  Overall well appearing.  Ambulatory.  I personally performed the services described in this documentation, which was scribed in my presence. The recorded information has been reviewed and is accurate.      Hoy Morn, MD 02/02/13 2028

## 2013-02-05 ENCOUNTER — Ambulatory Visit (INDEPENDENT_AMBULATORY_CARE_PROVIDER_SITE_OTHER)
Admission: RE | Admit: 2013-02-05 | Discharge: 2013-02-05 | Disposition: A | Discharge status: Home / Self Care | Payer: Medicare Other | Source: Ambulatory Visit | Attending: Cardiology | Admitting: Cardiology

## 2013-02-05 ENCOUNTER — Encounter: Payer: Self-pay | Admitting: Cardiology

## 2013-02-05 ENCOUNTER — Ambulatory Visit (INDEPENDENT_AMBULATORY_CARE_PROVIDER_SITE_OTHER): Payer: Medicare Other | Admitting: Cardiology

## 2013-02-05 VITALS — BP 150/82 | HR 63 | Wt 151.0 lb

## 2013-02-05 DIAGNOSIS — I4891 Unspecified atrial fibrillation: Secondary | ICD-10-CM

## 2013-02-05 DIAGNOSIS — I10 Essential (primary) hypertension: Secondary | ICD-10-CM

## 2013-02-05 DIAGNOSIS — I4892 Unspecified atrial flutter: Secondary | ICD-10-CM

## 2013-02-05 LAB — HEPATIC FUNCTION PANEL
Albumin: 3.6 g/dL (ref 3.5–5.2)
Alkaline Phosphatase: 82 U/L (ref 39–117)
Bilirubin, Direct: 0.2 mg/dL (ref 0.0–0.3)

## 2013-02-05 LAB — TSH: TSH: 8.09 u[IU]/mL — ABNORMAL HIGH (ref 0.35–5.50)

## 2013-02-05 NOTE — Progress Notes (Signed)
HPI: Pleasant male for fu of atrial flutter. Patient previously admitted to [facility] with chest pain and atrial flutter; also symptoms of a TIA. Echocardiogram showed normal function. TSH normal. Cardiac cath revealed an ejection fraction of 45%, minor nonobstructive coronary disease. Patient placed on rate controlling medications and Coumadin. Had DCCV of atrial flutter on Sep 16 2010. Atrial flutter was felt not easily amenable to ablation. Echo July of 2013 showed normal LV function, mild LVH, grade 1 diastolic dysfunction, mild MR, mild to moderate RAE. TSH 1.64. Patient seen again in Jan 2014 and in recurrent flutter. Placed on amiodarone. DCCV repeated 4/14. I last saw him in April of 2014. Since then, the patient denies any dyspnea on exertion, orthopnea, PND, pedal edema, palpitations, syncope or chest pain. Patient lost his balance 4 days ago and fell striking his right head. He was seen in emergency room and CT showed no bleed.   Current Outpatient Prescriptions  Medication Sig Dispense Refill  . amiodarone (PACERONE) 200 MG tablet Take 1 tablet (200 mg total) by mouth daily.      [name] diltiazem (DILACOR XR) 240 MG 24 hr capsule Take 240 mg by mouth daily.      . Multiple Vitamin (MULTIVITAMIN WITH MINERALS) TABS tablet Take 1 tablet by mouth daily.      [name] warfarin (COUMADIN) 5 MG tablet Take 2.5-5 mg by mouth every morning. Takes one-half tablet for 5 days, then takes one whole tablet for 2 days.       No current facility-administered medications for this visit.     Past Medical History  Diagnosis Date  . Prostate cancer   . Hard of hearing   . Vertigo   . Atypical atrial flutter     a. Dx 07/2010 and coumadin initiated;  b. 08/2010 s/p DCCV.  [name] TIA (transient ischemic attack)   . Midsternal chest pain     a. In setting of A Flutter 07/2010;  b. Cath 07/2010: nonobs dzs  . Anticoagulated on Coumadin     a. Followed @ [facility]    Past Surgical History  Procedure  Laterality Date  . Appendectomy    . Tonsillectomy    . Cardioversion  10/14/2011    Procedure: CARDIOVERSION;  Surgeon: Lelon Perla, MD;  Location: [place];  Service: Cardiovascular;  Laterality: N/A;  . Cardioversion N/A 07/24/2012    Procedure: CARDIOVERSION;  Surgeon: [name], MD;  Location: [facility] ENDOSCOPY;  Service: Cardiovascular;  Laterality: N/A;    History   Social History  . Marital Status: Widowed    Spouse Name: N/A    Number of Children: N/A  . Years of Education: N/A   Occupational History  . Not on file.   Social History Main Topics  . Smoking status: Never Smoker   . Smokeless tobacco: Not on file  . Alcohol Use: No  . Drug Use: No  . Sexual Activity: No   Other Topics Concern  . Not on file   Social History Narrative   Lives in [place] by himself.  Children live nearby and check in on him daily.    ROS: no fevers or chills, productive cough, hemoptysis, dysphasia, odynophagia, melena, hematochezia, dysuria, hematuria, rash, seizure activity, orthopnea, PND, pedal edema, claudication. Remaining systems are negative.  Physical Exam: Well-developed well-nourished in no acute distress.  Skin is warm and dry.  HEENT with small abrasion right orbital area. Neck is supple.  Chest is clear to auscultation  with normal expansion.  Cardiovascular exam is regular rate and rhythm.  Abdominal exam nontender or distended. No masses palpated. Extremities show no edema. Right hand with some edema and erythema from recent fall which is improving per patient report. neuro grossly intact  ECG sinus rhythm at a rate of 63. No ST changes.

## 2013-02-05 NOTE — Patient Instructions (Signed)
Your physician wants you to follow-up in: [place] will receive a reminder letter in the mail two months in advance. If you don't receive a letter, please call our office to schedule the follow-up appointment.   A chest x-ray takes a picture of the organs and structures inside the chest, including the heart, lungs, and blood vessels. This test can show several things, including, whether the heart is enlarges; whether fluid is building up in the lungs; and whether pacemaker / defibrillator leads are still in place. AT [place]  Your physician recommends that you HAVE LAB WORK TODAY

## 2013-02-05 NOTE — Assessment & Plan Note (Signed)
This patient remains in sinus rhythm. Continue amiodarone and Coumadin. Check TSH, liver functions and chest x-ray. Continue Cardizem.

## 2013-02-05 NOTE — Assessment & Plan Note (Signed)
Blood pressure mildly elevated but systolic typically runs [id number] to 140 at home. Continue present dose of Cardizem.

## 2013-02-07 ENCOUNTER — Ambulatory Visit: Payer: Medicare Other

## 2013-02-07 DIAGNOSIS — I4891 Unspecified atrial fibrillation: Secondary | ICD-10-CM

## 2013-02-07 LAB — T3, FREE: T3, Free: 2.3 pg/mL (ref 2.3–4.2)

## 2013-02-08 ENCOUNTER — Telehealth: Payer: Self-pay | Admitting: Cardiology

## 2013-02-08 NOTE — Telephone Encounter (Signed)
Left detailed message for anne of lab results. Will place a script in the mail for repeat labs in Larry Gates in 8 weeks. She will call with questions.

## 2013-02-08 NOTE — Telephone Encounter (Signed)
Follow UP:  Larry Gates states she just missed a call from [place] regarding the pt's test results.

## 2013-02-15 ENCOUNTER — Telehealth: Payer: Self-pay | Admitting: Cardiology

## 2013-02-15 NOTE — Telephone Encounter (Signed)
Spoke with pt son, aware what he received is for repeat tsh testing in 8 weeks. Son voiced understanding.

## 2013-02-15 NOTE — Telephone Encounter (Signed)
New Problem:  Pt's caregiver states he received some paperwork in the mail and he isn't sure what it is.. Pt's caregiver states he'd like the nurse to clarify what it is. Please advise

## 2013-04-02 ENCOUNTER — Ambulatory Visit (INDEPENDENT_AMBULATORY_CARE_PROVIDER_SITE_OTHER): Payer: Medicare Other | Admitting: Pharmacist Clinician (PhC)/ Clinical Pharmacy Specialist

## 2013-04-02 DIAGNOSIS — I4891 Unspecified atrial fibrillation: Secondary | ICD-10-CM

## 2013-04-02 LAB — POCT INR: INR: 1.7

## 2013-04-04 ENCOUNTER — Other Ambulatory Visit: Payer: Self-pay | Admitting: *Deleted

## 2013-04-04 MED ORDER — LEVOTHYROXINE SODIUM 25 MCG PO TABS: 25.0000 ug | ORAL_TABLET | Freq: Every day | ORAL | Status: DC

## 2013-05-04 ENCOUNTER — Encounter (HOSPITAL_COMMUNITY): Payer: Medicare Other | Admitting: Certified Registered Nurse Anesthetist

## 2013-05-04 ENCOUNTER — Encounter (HOSPITAL_COMMUNITY): Payer: Self-pay | Admitting: Anesthesiology

## 2013-05-04 ENCOUNTER — Emergency Department (HOSPITAL_COMMUNITY): Payer: Medicare Other

## 2013-05-04 ENCOUNTER — Inpatient Hospital Stay: Admit: 2013-05-04 | Payer: Self-pay | Admitting: Orthopedic Surgery

## 2013-05-04 ENCOUNTER — Emergency Department (HOSPITAL_COMMUNITY): Payer: Medicare Other | Admitting: Certified Registered Nurse Anesthetist

## 2013-05-04 ENCOUNTER — Ambulatory Visit (HOSPITAL_COMMUNITY)
Admission: EM | Admit: 2013-05-04 | Discharge: 2013-05-04 | Disposition: A | Discharge status: Home / Self Care | Payer: Medicare Other | Attending: Orthopedic Surgery | Admitting: Orthopedic Surgery

## 2013-05-04 ENCOUNTER — Encounter (HOSPITAL_COMMUNITY): Admission: EM | Disposition: A | Discharge status: Home / Self Care | Payer: Self-pay | Source: Home / Self Care

## 2013-05-04 DIAGNOSIS — Z8673 Personal history of transient ischemic attack (TIA), and cerebral infarction without residual deficits: Secondary | ICD-10-CM | POA: Insufficient documentation

## 2013-05-04 DIAGNOSIS — I4892 Unspecified atrial flutter: Secondary | ICD-10-CM | POA: Insufficient documentation

## 2013-05-04 DIAGNOSIS — W298XXA Contact with other powered powered hand tools and household machinery, initial encounter: Secondary | ICD-10-CM | POA: Diagnosis not present

## 2013-05-04 DIAGNOSIS — Z8546 Personal history of malignant neoplasm of prostate: Secondary | ICD-10-CM | POA: Insufficient documentation

## 2013-05-04 DIAGNOSIS — S61209A Unspecified open wound of unspecified finger without damage to nail, initial encounter: Secondary | ICD-10-CM | POA: Insufficient documentation

## 2013-05-04 DIAGNOSIS — Z7901 Long term (current) use of anticoagulants: Secondary | ICD-10-CM | POA: Diagnosis not present

## 2013-05-04 DIAGNOSIS — I4891 Unspecified atrial fibrillation: Secondary | ICD-10-CM | POA: Insufficient documentation

## 2013-05-04 DIAGNOSIS — IMO0002 Reserved for concepts with insufficient information to code with codable children: Secondary | ICD-10-CM | POA: Diagnosis not present

## 2013-05-04 DIAGNOSIS — S61412A Laceration without foreign body of left hand, initial encounter: Secondary | ICD-10-CM

## 2013-05-04 HISTORY — PX: I & D EXTREMITY: SHX5045

## 2013-05-04 LAB — CBC WITH DIFFERENTIAL/PLATELET
Basophils Absolute: 0 10*3/uL (ref 0.0–0.1)
Basophils Relative: 0 % (ref 0–1)
EOS PCT: 0 % (ref 0–5)
Eosinophils Absolute: 0 10*3/uL (ref 0.0–0.7)
HEMATOCRIT: 44.1 % (ref 39.0–52.0)
HEMOGLOBIN: 16.2 g/dL (ref 13.0–17.0)
Lymphocytes Relative: 8 % — ABNORMAL LOW (ref 12–46)
Lymphs Abs: 0.5 10*3/uL — ABNORMAL LOW (ref 0.7–4.0)
MCH: 33.8 pg (ref 26.0–34.0)
MCHC: 36.7 g/dL — AB (ref 30.0–36.0)
MCV: 91.9 fL (ref 78.0–100.0)
MONO ABS: 0.2 10*3/uL (ref 0.1–1.0)
MONOS PCT: 2 % — AB (ref 3–12)
NEUTROS ABS: 5.7 10*3/uL (ref 1.7–7.7)
Neutrophils Relative %: 89 % — ABNORMAL HIGH (ref 43–77)
Platelets: 130 10*3/uL — ABNORMAL LOW (ref 150–400)
RBC: 4.8 MIL/uL (ref 4.22–5.81)
RDW: 13.7 % (ref 11.5–15.5)
WBC: 6.4 10*3/uL (ref 4.0–10.5)

## 2013-05-04 LAB — BASIC METABOLIC PANEL
BUN: 19 mg/dL (ref 6–23)
CHLORIDE: 102 meq/L (ref 96–112)
CO2: 21 mEq/L (ref 19–32)
Calcium: 8.7 mg/dL (ref 8.4–10.5)
Creatinine, Ser: 0.81 mg/dL (ref 0.50–1.35)
GFR calc non Af Amer: 81 mL/min — ABNORMAL LOW (ref >90)
Glucose, Bld: 107 mg/dL — ABNORMAL HIGH (ref 70–99)
POTASSIUM: 4 meq/L (ref 3.7–5.3)
SODIUM: 137 meq/L (ref 137–147)

## 2013-05-04 LAB — POCT I-STAT, CHEM 8
BUN: 20 mg/dL (ref 6–23)
CHLORIDE: 105 meq/L (ref 96–112)
Calcium, Ion: 1.21 mmol/L (ref 1.13–1.30)
Creatinine, Ser: 0.9 mg/dL (ref 0.50–1.35)
Glucose, Bld: 106 mg/dL — ABNORMAL HIGH (ref 70–99)
HEMATOCRIT: 45 % (ref 39.0–52.0)
Hemoglobin: 15.3 g/dL (ref 13.0–17.0)
POTASSIUM: 3.8 meq/L (ref 3.7–5.3)
SODIUM: 140 meq/L (ref 137–147)
TCO2: 23 mmol/L (ref 0–100)

## 2013-05-04 LAB — TYPE AND SCREEN
ABO/RH(D): O NEG
ANTIBODY SCREEN: NEGATIVE

## 2013-05-04 LAB — PROTIME-INR
INR: 1.54 — ABNORMAL HIGH (ref 0.00–1.49)
Prothrombin Time: 18.1 seconds — ABNORMAL HIGH (ref 11.6–15.2)

## 2013-05-04 LAB — ABO/RH: ABO/RH(D): O NEG

## 2013-05-04 SURGERY — IRRIGATION AND DEBRIDEMENT EXTREMITY
Anesthesia: General | Site: Finger | Laterality: Left

## 2013-05-04 MED ORDER — OXYCODONE HCL 5 MG PO TABS: 5.0000 mg | ORAL_TABLET | Freq: Once | ORAL | Status: DC | PRN

## 2013-05-04 MED ORDER — TETANUS-DIPHTH-ACELL PERTUSSIS 5-2.5-18.5 LF-MCG/0.5 IM SUSP
0.5000 mL | Freq: Once | INTRAMUSCULAR | Status: AC
  Administered 2013-05-04: 0.5 mL via INTRAMUSCULAR
  Filled 2013-05-04: qty 0.5

## 2013-05-04 MED ORDER — FENTANYL CITRATE 0.05 MG/ML IJ SOLN
INTRAMUSCULAR | Status: DC | PRN
  Administered 2013-05-04: 100 ug via INTRAVENOUS
  Administered 2013-05-04: 50 ug via INTRAVENOUS

## 2013-05-04 MED ORDER — LACTATED RINGERS IV SOLN
INTRAVENOUS | Status: DC | PRN
  Administered 2013-05-04: 16:00:00 via INTRAVENOUS

## 2013-05-04 MED ORDER — PROPOFOL 10 MG/ML IV BOLUS
INTRAVENOUS | Status: DC | PRN
  Administered 2013-05-04: 150 mg via INTRAVENOUS

## 2013-05-04 MED ORDER — DEXAMETHASONE SODIUM PHOSPHATE 10 MG/ML IJ SOLN
INTRAMUSCULAR | Status: DC | PRN
Start: 2013-05-04 — End: 2013-05-04
  Administered 2013-05-04: 4 mg via INTRAVENOUS

## 2013-05-04 MED ORDER — OXYCODONE HCL 5 MG/5ML PO SOLN: 5.0000 mg | Freq: Once | ORAL | Status: DC | PRN

## 2013-05-04 MED ORDER — FENTANYL CITRATE 0.05 MG/ML IJ SOLN: 25.0000 ug | INTRAMUSCULAR | Status: DC | PRN

## 2013-05-04 MED ORDER — BACITRACIN-NEOMYCIN-POLYMYXIN 400-5-5000 EX OINT
TOPICAL_OINTMENT | CUTANEOUS | Status: AC
  Filled 2013-05-04: qty 1

## 2013-05-04 MED ORDER — ONDANSETRON HCL 4 MG/2ML IJ SOLN
INTRAMUSCULAR | Status: DC | PRN
  Administered 2013-05-04: 4 mg via INTRAVENOUS

## 2013-05-04 MED ORDER — ONDANSETRON HCL 4 MG/2ML IJ SOLN: 4.0000 mg | Freq: Once | INTRAMUSCULAR | Status: DC | PRN

## 2013-05-04 MED ORDER — EPHEDRINE SULFATE 50 MG/ML IJ SOLN
INTRAMUSCULAR | Status: DC | PRN
  Administered 2013-05-04: 5 mg via INTRAVENOUS

## 2013-05-04 MED ORDER — LIDOCAINE HCL (CARDIAC) 20 MG/ML IV SOLN
INTRAVENOUS | Status: DC | PRN
  Administered 2013-05-04: 50 mg via INTRAVENOUS

## 2013-05-04 MED ORDER — DEXTROSE 5 % IV SOLN
INTRAVENOUS | Status: DC | PRN
  Administered 2013-05-04: 16:00:00 via INTRAVENOUS

## 2013-05-04 MED ORDER — CEPHALEXIN 500 MG PO CAPS: 500.0000 mg | ORAL_CAPSULE | Freq: Four times a day (QID) | ORAL | Status: DC

## 2013-05-04 MED ORDER — BUPIVACAINE HCL (PF) 0.25 % IJ SOLN: INTRAMUSCULAR | Status: DC | PRN

## 2013-05-04 MED ORDER — ARTIFICIAL TEARS OP OINT
TOPICAL_OINTMENT | OPHTHALMIC | Status: DC | PRN
  Administered 2013-05-04: 1 via OPHTHALMIC

## 2013-05-04 MED ORDER — BUPIVACAINE HCL (PF) 0.25 % IJ SOLN
INTRAMUSCULAR | Status: AC
  Filled 2013-05-04: qty 30

## 2013-05-04 MED ORDER — SUCCINYLCHOLINE CHLORIDE 20 MG/ML IJ SOLN
INTRAMUSCULAR | Status: DC | PRN
  Administered 2013-05-04: 100 mg via INTRAVENOUS

## 2013-05-04 MED ORDER — CEFAZOLIN SODIUM-DEXTROSE 2-3 GM-% IV SOLR
INTRAVENOUS | Status: DC | PRN
  Administered 2013-05-04: 2 g via INTRAVENOUS

## 2013-05-04 MED ORDER — CEFAZOLIN SODIUM 1-5 GM-% IV SOLN
1.0000 g | Freq: Once | INTRAVENOUS | Status: AC
Start: 2013-05-04 — End: 2013-05-04
  Administered 2013-05-04: 1 g via INTRAVENOUS
  Filled 2013-05-04: qty 50

## 2013-05-04 MED ORDER — HYDROCODONE-ACETAMINOPHEN 5-325 MG PO TABS: 1.0000 | ORAL_TABLET | Freq: Four times a day (QID) | ORAL | Status: DC | PRN

## 2013-05-04 MED ORDER — CEFAZOLIN SODIUM-DEXTROSE 2-3 GM-% IV SOLR
INTRAVENOUS | Status: AC
  Filled 2013-05-04: qty 50

## 2013-05-04 SURGICAL SUPPLY — 46 items
BANDAGE CONFORM 2  STR LF (GAUZE/BANDAGES/DRESSINGS) ×3 IMPLANT
BANDAGE ELASTIC 3 VELCRO ST LF (GAUZE/BANDAGES/DRESSINGS) ×1 IMPLANT
BANDAGE ELASTIC 4 VELCRO ST LF (GAUZE/BANDAGES/DRESSINGS) ×2 IMPLANT
BANDAGE GAUZE ELAST BULKY 4 IN (GAUZE/BANDAGES/DRESSINGS) ×5 IMPLANT
CLOTH BEACON ORANGE TIMEOUT ST (SAFETY) ×2 IMPLANT
CORDS BIPOLAR (ELECTRODE) ×2 IMPLANT
CUFF TOURNIQUET SINGLE 18IN (TOURNIQUET CUFF) ×2 IMPLANT
CUFF TOURNIQUET SINGLE 24IN (TOURNIQUET CUFF) IMPLANT
DRSG ADAPTIC 3X8 NADH LF (GAUZE/BANDAGES/DRESSINGS) ×2 IMPLANT
ELECT REM PT RETURN 9FT ADLT (ELECTROSURGICAL) ×2
ELECTRODE REM PT RTRN 9FT ADLT (ELECTROSURGICAL) IMPLANT
GAUZE XEROFORM 1X8 LF (GAUZE/BANDAGES/DRESSINGS) ×2 IMPLANT
GAUZE XEROFORM 5X9 LF (GAUZE/BANDAGES/DRESSINGS) ×1 IMPLANT
GLOVE BIOGEL M STRL SZ7.5 (GLOVE) ×2 IMPLANT
GLOVE SS BIOGEL STRL SZ 8 (GLOVE) ×1 IMPLANT
GLOVE SUPERSENSE BIOGEL SZ 8 (GLOVE) ×1
GOWN SRG XL XLNG 56XLVL 4 (GOWN DISPOSABLE) ×3 IMPLANT
GOWN STRL NON-REIN LRG LVL3 (GOWN DISPOSABLE) ×6 IMPLANT
GOWN STRL NON-REIN XL XLG LVL4 (GOWN DISPOSABLE) ×6
HANDPIECE INTERPULSE COAX TIP (DISPOSABLE)
KIT BASIN OR (CUSTOM PROCEDURE TRAY) ×2 IMPLANT
KIT ROOM TURNOVER OR (KITS) ×2 IMPLANT
MANIFOLD NEPTUNE II (INSTRUMENTS) ×2 IMPLANT
NDL HYPO 25GX1X1/2 BEV (NEEDLE) IMPLANT
NEEDLE HYPO 25GX1X1/2 BEV (NEEDLE) ×2 IMPLANT
NS IRRIG 1000ML POUR BTL (IV SOLUTION) ×2 IMPLANT
PACK ORTHO EXTREMITY (CUSTOM PROCEDURE TRAY) ×2 IMPLANT
PAD ARMBOARD 7.5X6 YLW CONV (MISCELLANEOUS) ×4 IMPLANT
PAD CAST 4YDX4 CTTN HI CHSV (CAST SUPPLIES) ×1 IMPLANT
PADDING CAST ABS 4INX4YD NS (CAST SUPPLIES) ×1
PADDING CAST ABS COTTON 4X4 ST (CAST SUPPLIES) IMPLANT
PADDING CAST COTTON 4X4 STRL (CAST SUPPLIES) ×2
SET CYSTO W/LG BORE CLAMP LF (SET/KITS/TRAYS/PACK) ×1 IMPLANT
SET HNDPC FAN SPRY TIP SCT (DISPOSABLE) IMPLANT
SPONGE GAUZE 4X4 12PLY (GAUZE/BANDAGES/DRESSINGS) ×4 IMPLANT
SPONGE LAP 18X18 X RAY DECT (DISPOSABLE) ×2 IMPLANT
SPONGE LAP 4X18 X RAY DECT (DISPOSABLE) ×2 IMPLANT
SUT CHROMIC 5 0 P 3 (SUTURE) ×2 IMPLANT
SUT PROLENE 5 0 PC 1 (SUTURE) ×2 IMPLANT
SYR CONTROL 10ML LL (SYRINGE) IMPLANT
TOWEL OR 17X24 6PK STRL BLUE (TOWEL DISPOSABLE) ×2 IMPLANT
TOWEL OR 17X26 10 PK STRL BLUE (TOWEL DISPOSABLE) ×2 IMPLANT
TUBE ANAEROBIC SPECIMEN COL (MISCELLANEOUS) IMPLANT
TUBE CONNECTING 12X1/4 (SUCTIONS) ×2 IMPLANT
WATER STERILE IRR 1000ML POUR (IV SOLUTION) ×2 IMPLANT
YANKAUER SUCT BULB TIP NO VENT (SUCTIONS) ×2 IMPLANT

## 2013-05-04 NOTE — Discharge Instructions (Signed)
Resume Coumadin tomorrow.. do not remove dressing.Marland KitchenMarland KitchenKeep bandage clean and dry.  Call for any problems.  No smoking.  Criteria for driving a car: you should be off your pain medicine for 7-8 hours, able to drive one handed(confident), thinking clearly and feeling able in your judgement to drive. Continue elevation as it will decrease swelling.  If instructed by MD move your fingers within the confines of the bandage/splint.  Use ice if instructed by your MD. Call immediately for any sudden loss of feeling in your hand/arm or change in functional abilities of the extremity.

## 2013-05-04 NOTE — H&P (Signed)
Larry Gates is an 80 y.o. male.   Chief Complaint: Injury to the left secondary to a table saw HPI: The patient is a pleasant 80 year old gentleman who is seen today at the [facility] cone for a table saw injury he sustained to his left hand earlier today. The patient was initially seen and evaluated at [facility] was seen and evaluated by Dr. Atilano Median. Was noted to have a significant injury process to the hand to include the thumb near complete amputation to the index finger vascularity was noted as well as injury to the middle and ring finger. Given his significant move his injury we will contacted and asked to see and treat the patient in regards to his hand predicament. He is seen and evaluated in the preop holding area. The patient does have a history atrial fibrillation, atrial flutter and is currently anticoagulated with Coumadin. Preoperative labs showed a stable for surgery his INR 1.54. Discussed all issues with the patient and his family at bedside. We have not taking down the dressings that were placed on the hand initially as we did not want to cause further soft tissue damage and risk of further bleeding. Both examination and surgical measures will be performed in the operative setting.  Past Medical History  Diagnosis Date  . Prostate cancer   . Hard of hearing   . Vertigo   . Atypical atrial flutter     a. Dx 07/2010 and coumadin initiated;  b. 08/2010 s/p DCCV.  [name] TIA (transient ischemic attack)   . Midsternal chest pain     a. In setting of A Flutter 07/2010;  b. Cath 07/2010: nonobs dzs  . Anticoagulated on Coumadin     a. Followed @ [facility]    Past Surgical History  Procedure Laterality Date  . Appendectomy    . Tonsillectomy    . Cardioversion  10/14/2011    Procedure: CARDIOVERSION;  Surgeon: Lelon Perla, MD;  Location: [place];  Service: Cardiovascular;  Laterality: N/A;  . Cardioversion N/A 07/24/2012    Procedure: CARDIOVERSION;  Surgeon: [name], MD;  Location:  [facility] ENDOSCOPY;  Service: Cardiovascular;  Laterality: N/A;    Family History  Problem Relation Age of Onset  . Stroke Father    Social History:  reports that he has never smoked. He does not have any smokeless tobacco history on file. He reports that he does not drink alcohol or use illicit drugs.  Allergies:  Allergies  Allergen Reactions  . Penicillins     Medications Prior to Admission  Medication Sig Dispense Refill  . amiodarone (PACERONE) 200 MG tablet Take 100 mg by mouth daily.      [name] diltiazem (DILACOR XR) 240 MG 24 hr capsule Take 240 mg by mouth daily.      [name] levothyroxine (SYNTHROID) 25 MCG tablet Take 1 tablet (25 mcg total) by mouth daily before breakfast.  30 tablet  12  . Multiple Vitamin (MULTIVITAMIN WITH MINERALS) TABS tablet Take 1 tablet by mouth daily.      [name] warfarin (COUMADIN) 5 MG tablet Take 2.5-5 mg by mouth every morning. Takes one-half tablet for 5 days, then takes one whole tablet for 2 days.        Results for orders placed during the hospital encounter of 05/04/13 (from the past 48 hour(s))  CBC WITH DIFFERENTIAL     Status: Abnormal   Collection Time    05/04/13  1:20 PM      Result Value Range  WBC 6.4  4.0 - 10.5 K/uL   RBC 4.80  4.22 - 5.81 MIL/uL   Hemoglobin 16.2  13.0 - 17.0 g/dL   HCT 44.1  39.0 - 52.0 %   MCV 91.9  78.0 - 100.0 fL   MCH 33.8  26.0 - 34.0 pg   MCHC 36.7 (*) 30.0 - 36.0 g/dL   RDW 13.7  11.5 - 15.5 %   Platelets 130 (*) 150 - 400 K/uL   Neutrophils Relative % 89 (*) 43 - 77 %   Neutro Abs 5.7  1.7 - 7.7 K/uL   Lymphocytes Relative 8 (*) 12 - 46 %   Lymphs Abs 0.5 (*) 0.7 - 4.0 K/uL   Monocytes Relative 2 (*) 3 - 12 %   Monocytes Absolute 0.2  0.1 - 1.0 K/uL   Eosinophils Relative 0  0 - 5 %   Eosinophils Absolute 0.0  0.0 - 0.7 K/uL   Basophils Relative 0  0 - 1 %   Basophils Absolute 0.0  0.0 - 0.1 K/uL  PROTIME-INR     Status: Abnormal   Collection Time    05/04/13  1:20 PM      Result Value Range    Prothrombin Time 18.1 (*) 11.6 - 15.2 seconds   INR 1.54 (*) 0.00 - 6.38  BASIC METABOLIC PANEL     Status: Abnormal   Collection Time    05/04/13  1:20 PM      Result Value Range   Sodium 137  137 - 147 mEq/L   Potassium 4.0  3.7 - 5.3 mEq/L   Chloride 102  96 - 112 mEq/L   CO2 21  19 - 32 mEq/L   Glucose, Bld 107 (*) 70 - 99 mg/dL   BUN 19  6 - 23 mg/dL   Creatinine, Ser 0.81  0.50 - 1.35 mg/dL   Calcium 8.7  8.4 - 10.5 mg/dL   GFR calc non Af Amer 81 (*) >90 mL/min   GFR calc Af Amer >90  >90 mL/min   Comment: (NOTE)     The eGFR has been calculated using the CKD EPI equation.     This calculation has not been validated in all clinical situations.     eGFR's persistently <90 mL/min signify possible Chronic Kidney     Disease.  TYPE AND SCREEN     Status: None   Collection Time    05/04/13  1:30 PM      Result Value Range   ABO/RH(D) O NEG     Antibody Screen NEG     Sample Expiration 05/07/2013    ABO/RH     Status: None   Collection Time    05/04/13  1:30 PM      Result Value Range   ABO/RH(D) O NEG    POCT I-STAT, CHEM 8     Status: Abnormal   Collection Time    05/04/13  1:40 PM      Result Value Range   Sodium 140  137 - 147 mEq/L   Potassium 3.8  3.7 - 5.3 mEq/L   Chloride 105  96 - 112 mEq/L   BUN 20  6 - 23 mg/dL   Creatinine, Ser 0.90  0.50 - 1.35 mg/dL   Glucose, Bld 106 (*) 70 - 99 mg/dL   Calcium, Ion 1.21  1.13 - 1.30 mmol/L   TCO2 23  0 - 100 mmol/L   Hemoglobin 15.3  13.0 - 17.0 g/dL   HCT  45.0  39.0 - 52.0 %   Dg Chest 2 View  05/04/2013   CLINICAL DATA:  Pre-admit for surgery.  EXAM: CHEST  2 VIEW  COMPARISON:  Two-view chest 02/05/2013.  FINDINGS: The heart is mildly enlarged. Left hemidiaphragm remains elevated. There is no significant edema or effusion to suggest failure. The visualized soft tissues and bony thorax are unremarkable.  IMPRESSION: 1. Stable cardiomegaly without failure. 2. Slight decreased lung volumes. 3. No acute cardiopulmonary  disease. 4. Stable elevation of the left hemidiaphragm.   Electronically Signed   By: Lawrence Santiago M.D.   On: 05/04/2013 14:13    Review of Systems  Constitutional: Negative.   HENT: Negative.   Eyes: Negative.   Respiratory: Negative.   Cardiovascular:       History of A-fib, A flutter, Htn  Gastrointestinal: Negative.   Genitourinary: Negative.   Musculoskeletal:       Left thumb, index, middle and ring finger dressed from [facility].   Skin: Negative.     Blood pressure 165/80, pulse 66, temperature 98.2 F (36.8 C), temperature source Oral, resp. rate 16, SpO2 99.00%. Physical Exam  The patient is pleasant, in no acute distress, alert and oriented, he is hard of hearing His family is in the room with him Head atraumatic normocephalic Chest shows equal bilateral expansion respirations are nonlabored Abdomen is nontender The upper extremity shows a has multiple dressings applied to the thumb index middle and ring finger which are fairly saturated. Hand wrist and remaining portion of the upper extremity is without abnormality.  Assessment/Plan Table saw injury to the left hand to include the thumb index middle and ring finger .Marland KitchenWe are planning surgery for your upper extremity. The risk and benefits of surgery include risk of bleeding infection anesthesia damage to normal structures and failure of the surgery to accomplish its intended goals of relieving symptoms and restoring function with this in mind we'll going to proceed. I have specifically discussed with the patient the pre-and postoperative regime and the does and don'ts and risk and benefits in great detail. Risk and benefits of surgery also include risk of dystrophy chronic nerve pain failure of the healing process to go onto completion and other inherent risks of surgery The relavent the pathophysiology of the disease/injury process, as well as the alternatives for treatment and postoperative course of action has been  discussed in great detail with the patient who desires to proceed.  We will do everything in our power to help you (the patient) restore function to the upper extremity. Is a pleasure to see this patient today.    Corben Auzenne L 05/04/2013, 3:36 PM

## 2013-05-04 NOTE — Progress Notes (Signed)
Orthopedic Tech Progress Note Patient Details:  Larry Gates [birth year] [id number]  Ortho Devices Type of Ortho Device: Arm sling Ortho Device/Splint Location: lue Ortho Device/Splint Interventions: Application   Sheyli Horwitz 05/04/2013, 6:30 PM

## 2013-05-04 NOTE — Transfer of Care (Signed)
Immediate Anesthesia Transfer of Care Note  Patient: Larry Gates  Procedure(s) Performed: Procedure(s): IRRIGATION AND DEBRIDEMENT of left hand with repair of lacerations and revision amputation of index finger (Left)  Patient Location: PACU  Anesthesia Type:General  Level of Consciousness: oriented, sedated, patient cooperative and responds to stimulation  Airway & Oxygen Therapy: Patient Spontanous Breathing and Patient connected to nasal cannula oxygen  Post-op Assessment: Report given to PACU RN, Post -op Vital signs reviewed and stable, Patient moving all extremities and Patient moving all extremities X 4  Post vital signs: Reviewed and stable  Complications: No apparent anesthesia complications

## 2013-05-04 NOTE — Anesthesia Postprocedure Evaluation (Signed)
  Anesthesia Post-op Note  Patient: Larry Gates  Procedure(s) Performed: Procedure(s): IRRIGATION AND DEBRIDEMENT of left hand with repair of lacerations and revision amputation of index finger (Left)  Patient Location: PACU  Anesthesia Type:General  Level of Consciousness: awake, alert  and oriented  Airway and Oxygen Therapy: Patient Spontanous Breathing  Post-op Pain: none  Post-op Assessment: Post-op Vital signs reviewed, Patient's Cardiovascular Status Stable, Respiratory Function Stable, Patent Airway and Pain level controlled  Post-op Vital Signs: stable  Complications: No apparent anesthesia complications

## 2013-05-04 NOTE — ED Provider Notes (Signed)
CSN: [id number]     Arrival date & time 05/04/13  1226 History   None    Chief Complaint  Patient presents with  . Extremity Laceration   (Consider location/radiation/quality/duration/timing/severity/associated sxs/prior Treatment) HPI Comments: Patient presents as a transfer from [facility] urgent care for evaluation by Dr. Amedeo Plenty for a complex hand laceration. He was working with a table saw and lacerated his first 4 fingers of his left hand. He is on Coumadin. He was transferred here by EMS with Dr. Amedeo Plenty accepting. He is unaware when his last tetanus shot was. He did not receive antibiotics prior to arrival.  He denies other recent illnesses   Past Medical History  Diagnosis Date  . Prostate cancer   . Hard of hearing   . Vertigo   . Atypical atrial flutter     a. Dx 07/2010 and coumadin initiated;  b. 08/2010 s/p DCCV.  [name] TIA (transient ischemic attack)   . Midsternal chest pain     a. In setting of A Flutter 07/2010;  b. Cath 07/2010: nonobs dzs  . Anticoagulated on Coumadin     a. Followed @ [facility]   Past Surgical History  Procedure Laterality Date  . Appendectomy    . Tonsillectomy    . Cardioversion  10/14/2011    Procedure: CARDIOVERSION;  Surgeon: Lelon Perla, MD;  Location: [place];  Service: Cardiovascular;  Laterality: N/A;  . Cardioversion N/A 07/24/2012    Procedure: CARDIOVERSION;  Surgeon: [name], MD;  Location: [facility] ENDOSCOPY;  Service: Cardiovascular;  Laterality: N/A;   Family History  Problem Relation Age of Onset  . Stroke Father    History  Substance Use Topics  . Smoking status: Never Smoker   . Smokeless tobacco: Not on file  . Alcohol Use: No    Review of Systems  Constitutional: Negative for fever, chills, diaphoresis and fatigue.  HENT: Negative for congestion, rhinorrhea and sneezing.   Eyes: Negative.   Respiratory: Negative for cough, chest tightness and shortness of breath.   Cardiovascular: Negative for chest pain and leg swelling.   Gastrointestinal: Negative for nausea, vomiting, abdominal pain, diarrhea and blood in stool.  Genitourinary: Negative for frequency, hematuria, flank pain and difficulty urinating.  Musculoskeletal: Positive for arthralgias. Negative for back pain.  Skin: Positive for wound. Negative for rash.  Neurological: Positive for numbness. Negative for dizziness, speech difficulty, weakness and headaches.    Allergies  Penicillins  Home Medications   No current outpatient prescriptions on file. BP 165/80  Pulse 66  Temp(Src) 98.2 F (36.8 C) (Oral)  Resp 16  SpO2 99% Physical Exam  Constitutional: He is oriented to person, place, and time. He appears well-developed and well-nourished.  HENT:  Head: Normocephalic and atraumatic.  Eyes: Pupils are equal, round, and reactive to light.  Neck: Normal range of motion. Neck supple.  Cardiovascular: Normal rate, regular rhythm and normal heart sounds.   Pulmonary/Chest: Effort normal and breath sounds normal. No respiratory distress. He has no wheezes. He has no rales. He exhibits no tenderness.  Abdominal: Soft. Bowel sounds are normal. There is no tenderness. There is no rebound and no guarding.  Musculoskeletal: Normal range of motion. He exhibits no edema.  Patient has bandages to the thumb and first 3 fingers of the left hand. 2 of the bandages are bloody but there is no apparent ongoing bleeding.  Lymphadenopathy:    He has no cervical adenopathy.  Neurological: He is alert and oriented to person, place, and time.  Skin: Skin is warm and dry. No rash noted.  Psychiatric: He has a normal mood and affect.    ED Course  Procedures (including critical care time) Labs Review Labs Reviewed  CBC WITH DIFFERENTIAL - Abnormal; Notable for the following:    MCHC 36.7 (*)    Platelets 130 (*)    Neutrophils Relative % 89 (*)    Lymphocytes Relative 8 (*)    Lymphs Abs 0.5 (*)    Monocytes Relative 2 (*)    All other components within  normal limits  PROTIME-INR - Abnormal; Notable for the following:    Prothrombin Time 18.1 (*)    INR 1.54 (*)    All other components within normal limits  BASIC METABOLIC PANEL - Abnormal; Notable for the following:    Glucose, Bld 107 (*)    GFR calc non Af Amer 81 (*)    All other components within normal limits  POCT I-STAT, CHEM 8 - Abnormal; Notable for the following:    Glucose, Bld 106 (*)    All other components within normal limits  TYPE AND SCREEN  ABO/RH   Imaging Review Dg Chest 2 View  05/04/2013   CLINICAL DATA:  Pre-admit for surgery.  EXAM: CHEST  2 VIEW  COMPARISON:  Two-view chest 02/05/2013.  FINDINGS: The heart is mildly enlarged. Left hemidiaphragm remains elevated. There is no significant edema or effusion to suggest failure. The visualized soft tissues and bony thorax are unremarkable.  IMPRESSION: 1. Stable cardiomegaly without failure. 2. Slight decreased lung volumes. 3. No acute cardiopulmonary disease. 4. Stable elevation of the left hemidiaphragm.   Electronically Signed   By: Lawrence Santiago M.D.   On: 05/04/2013 14:13   Dg Hand Complete Left  05/04/2013   CLINICAL DATA:  Laceration with table saw.  EXAM: LEFT HAND - COMPLETE 3+ VIEW  COMPARISON:  None.  FINDINGS: Extensive soft tissue swelling is associated with a laceration of the thumb. There is a fracture into the distal phalanx with fragments during the surface of the laceration. An additional fracture is noted at the base the middle phalanx in the index finger. This extends into the joint. Soft tissue lacerations are present in the middle finger and ring finger without definite osseous abnormalities. No radiopaque foreign body is present.  IMPRESSION: 1. Lacerations involving the thumb, index, middle, and ring fingers with extensive soft tissue swelling. 2. Fracture defect in the distal phalanx of the thumb with bone fragment stand skin surface. Portions of the bone appear to be intact. 3. Fractures through  the base of the middle phalanx in the index finger along the ulnar side. This fracture extends into the joint.   Electronically Signed   By: Lawrence Santiago M.D.   On: 05/04/2013 15:40    EKG Interpretation    Date/Time:  Saturday May 04 2013 14:07:11 EST Ventricular Rate:  70 PR Interval:  212 QRS Duration: 102 QT Interval:  436 QTC Calculation: 470 R Axis:   69 Text Interpretation:  Sinus rhythm Borderline prolonged PR interval Probable left atrial enlargement Baseline wander in lead(s) V5 since last tracing no significant change Confirmed by Cortland Crehan  MD, Fonnie Crookshanks ([id number]) on 05/04/2013 3:20:51 PM            MDM   1. Laceration of hand, left, complicated     Patient is a transfer from more urgent care for complex fracture/laceration to his left hand. Dr. Amedeo Plenty  to take him to the OR for repair. I  did give him Ancef and updated his TDAP. Preop labs were ordered.    Malvin Johns, MD 05/04/13 [phone]

## 2013-05-04 NOTE — Preoperative (Signed)
Beta Blockers   Reason not to administer Beta Blockers:Not Applicable 

## 2013-05-04 NOTE — Anesthesia Preprocedure Evaluation (Addendum)
Anesthesia Evaluation  Patient identified by MRN, date of birth, ID band Patient awake    Reviewed: Allergy & Precautions, H&P , NPO status , Patient's Chart, lab work & pertinent test results  Airway Mallampati: II TM Distance: >3 FB Neck ROM: Full    Dental  (+) Teeth Intact and Poor Dentition   Pulmonary  breath sounds clear to auscultation        Cardiovascular Rate:Normal     Neuro/Psych    GI/Hepatic   Endo/Other    Renal/GU      Musculoskeletal   Abdominal   Peds  Hematology   Anesthesia Other Findings   Reproductive/Obstetrics                          Anesthesia Physical Anesthesia Plan  ASA: III and emergent  Anesthesia Plan:    Post-op Pain Management:    Induction: Intravenous and Cricoid pressure planned  Airway Management Planned: Oral ETT  Additional Equipment:   Intra-op Plan:   Post-operative Plan: Extubation in OR  Informed Consent: I have reviewed the patients History and Physical, chart, labs and discussed the procedure including the risks, benefits and alternatives for the proposed anesthesia with the patient or authorized representative who has indicated his/her understanding and acceptance.     Plan Discussed with:   Anesthesia Plan Comments:        Anesthesia Quick Evaluation

## 2013-05-04 NOTE — Progress Notes (Signed)
Orthopedic Tech Progress Note Patient Details:  [name] 1928/10/27 [id number]  [name], male   DOB: [birth year], 80 y.o.   MRN: [id number] Viewed order from doctor's order list  Hildred Priest 05/04/2013, 6:30 PM

## 2013-05-04 NOTE — ED Notes (Signed)
Patient transported by [facility] from the [facility] for complaint of laceration to the left hand at approximately 1100 this morning.  Patient was cutting wood with a table saw and lacerated the left index, thumb, middle and ring finger.  EMS reports laceration to the pad of the thumb, middle and ring finger and a near amputation of the left index finger.

## 2013-05-04 NOTE — Anesthesia Procedure Notes (Signed)
Procedure Name: Intubation Date/Time: 05/04/2013 3:52 PM Performed by: Jacquiline Doe A Pre-anesthesia Checklist: Patient identified, Timeout performed, Emergency Drugs available, Suction available and Patient being monitored Patient Re-evaluated:Patient Re-evaluated prior to inductionOxygen Delivery Method: Circle system utilized Preoxygenation: Pre-oxygenation with 100% oxygen Intubation Type: IV induction and Cricoid Pressure applied Ventilation: Mask ventilation without difficulty Laryngoscope Size: Mac and 4 Grade View: Grade I Tube type: Oral Tube size: 8.0 mm Number of attempts: 1 Airway Equipment and Method: Stylet Placement Confirmation: ETT inserted through vocal cords under direct vision,  breath sounds checked- equal and bilateral and positive ETCO2 Secured at: 22 cm Tube secured with: Tape Dental Injury: Teeth and Oropharynx as per pre-operative assessment

## 2013-05-04 NOTE — Op Note (Signed)
See Dictation [id number] Hardie Pulley Md

## 2013-05-05 NOTE — Op Note (Signed)
NAMESEDERICK, JACOBSEN NO.:  [id number]  MEDICAL RECORD NO.:  [id number]  LOCATION:  [facility]                         FACILITY:  [facility]  PHYSICIAN:  Satira Anis. Sameria Morss, M.D.DATE OF BIRTH:  Apr 02, 1929  DATE OF PROCEDURE:  05/04/2013 DATE OF DISCHARGE:  05/04/2013                              OPERATIVE REPORT   PREOPERATIVE DIAGNOSES:  Table saw injury, left hand with near-complete amputation to the index finger and significant segmental bone loss, ring, middle, and thumb digit lacerations, severe in nature.  POSTOPERATIVE DIAGNOSES:  Table saw injury, left hand with near-complete amputation to the index finger and significant segmental bone loss, ring, middle, and thumb digit lacerations, severe in nature.  PROCEDURE: 1. Revision amputation with bilateral neurectomies, left index finger     at the proximal interphalangeal joint level. 2. Irrigation and debridement, left thumb with a volar advancement     flap closure.  This was an excisional debridement of skin and     subcutaneous tissue. 3. Irrigation and debridement of skin, subcutaneous tissue, left     middle finger with closure of a simple lacerations less than 2 cm. 4. Irrigation and debridement of skin, subcutaneous tissue, and     periosteal tissue, left ring finger. 5. Volar flap closure of the left ring finger.  SURGEON:  Satira Anis. Amedeo Plenty, M.D.  ASSISTANT:  Avelina Laine, PA-C  COMPLICATION:  None.  ANESTHESIA:  General.  TOURNIQUET TIME:  Less than 30 minutes.  INDICATIONS:  This patient is an 80 year old male who was injured with table saw earlier today, he was transferred in from [facility] and we were asked to assume his care.  We accepted him in transfer.  He was prepped for surgery.  He does have a history of a hearing loss as well as other medical problems including atrial flutter and atrial fibrillation.  He is on blood thinners in the form of Coumadin.  He understands risks  and benefits and desires to proceed.  OPERATION IN DETAIL:  The patient was seen by myself and Anesthesia, taken to operative suite and a smooth induction of general anesthetic, prepped and draped in usual sterile fashion, Betadine scrub and paint about the left upper extremity.  Once this done and the patient underwent evaluation of the index finger, he had segmental bone loss, flexor tendon and extensor tendon tearing.  There was a small sleeve of tissue radially, but both neurovascular bundles were encroached upon. At this time, it was elected to proceed with the revision amputation.  Once the decision was rendered, we then set out to perform an irrigation and debridement of the thumb, skin, subcutaneous tissue, and deep periosteal tissue was I and D'd.  This was an excisional debridement without difficulty.  Following this, irrigation and debridement of the index finger, completed amputation was accomplished, this was skin, subcutaneous tissue, bone, and associated tendon, volar plate, and soft tissue structures.  This was an excisional debridement.  Following this, skin and subcutaneous tissue debridement less than 20 cm2 of the ring and middle finger were accomplished without difficulty with multiple lacerations.  Once this done, the patient underwent a volar flap-type closure of the thumb.  The patient had volar flap down to the periosteal tissue, it was able to be closed nicely and without difficulty and refill was satisfactory.  This was done with tourniquet deflated.  Following this, the index finger was dressed with bilateral neurectomies, with crushing and cauterization technique allowing the radial and ulnar digital nerves to retract proximally.  The flexor tendon stump could be palpable in the palm and thus had nice retraction.  The volar plate of the index finger was excised with PIP region as was the extensor apparatus.  Following this, a careful closure of  the revision amputation was accomplished with 5-0 chromic and 5-0 Prolene suture.  Following this, the middle finger was addressed with I and D of skin, subcutaneous tissue as mentioned previously.  This was less than 20 cm2 I and D including both the middle and ring finger.  Following this, the patient had a closure of 2 simple lacerations less than 2-3 cm with chromic and Prolene.  Following this, the middle finger had somewhat of a volar flap and this was repaired as well with a combination of chromic and Prolene sutures to our satisfaction.  The patient maintained refill, met all areas, there were no significant complicating features.  He will be monitored and will allow him to go home.  We did place him in a dressing regime of Adaptic, Xeroform, 4x4 gauze, Webril, and associated volar splint.  I feel that he should return to the office to see [place] for wound check in 7-10 days.  We will have our therapist see him for wound check, and we will see him in conjunction with therapy on the same day.  I feel we simply need to protect the tips of the fingers.  He will be discharged home on appropriate medicine including Keflex 500 mg 1 p.o. q.i.d. x10 days as well as pain medicine in the form of Norco. We will look forward to seeing him in 10 days.  Should any problems occur, he will notify us.  These notes have been discussed.  All questions have been encouraged and answered.  It was an absolute pleasure to participating in his care and we look forward participating in his postop recovery.  Should any issues arise, he will notify us.     Satira Anis. Amedeo Plenty, M.D.    [facility]  D:  05/04/2013  T:  05/04/2013  Job:  [id number]

## 2013-05-07 ENCOUNTER — Encounter (HOSPITAL_COMMUNITY): Payer: Self-pay | Admitting: Orthopedic Surgery

## 2013-05-14 ENCOUNTER — Ambulatory Visit (INDEPENDENT_AMBULATORY_CARE_PROVIDER_SITE_OTHER): Payer: Medicare Other | Admitting: Pharmacist Clinician (PhC)/ Clinical Pharmacy Specialist

## 2013-05-14 ENCOUNTER — Other Ambulatory Visit: Payer: Self-pay | Admitting: Cardiology

## 2013-05-14 DIAGNOSIS — E039 Hypothyroidism, unspecified: Secondary | ICD-10-CM

## 2013-05-14 DIAGNOSIS — I4891 Unspecified atrial fibrillation: Secondary | ICD-10-CM

## 2013-05-14 DIAGNOSIS — Z79899 Other long term (current) drug therapy: Secondary | ICD-10-CM

## 2013-05-14 LAB — POCT INR: INR: 1.9

## 2013-05-15 LAB — T4, FREE: Free T4: 0.97 ng/dL (ref 0.82–1.77)

## 2013-05-15 LAB — TSH: TSH: 6.58 u[IU]/mL — ABNORMAL HIGH (ref 0.450–4.500)

## 2013-06-10 ENCOUNTER — Other Ambulatory Visit: Payer: Self-pay | Admitting: Family Medicine

## 2013-06-25 ENCOUNTER — Encounter: Payer: Self-pay | Admitting: Pharmacist Clinician (PhC)/ Clinical Pharmacy Specialist

## 2013-07-23 ENCOUNTER — Other Ambulatory Visit: Payer: Self-pay | Admitting: *Deleted

## 2013-07-23 ENCOUNTER — Other Ambulatory Visit: Payer: Self-pay | Admitting: Cardiology

## 2013-07-23 MED ORDER — AMIODARONE HCL 200 MG PO TABS: 200.0000 mg | ORAL_TABLET | Freq: Every day | ORAL | Status: DC

## 2013-07-29 ENCOUNTER — Ambulatory Visit (INDEPENDENT_AMBULATORY_CARE_PROVIDER_SITE_OTHER): Payer: Medicare Other | Admitting: Family Medicine

## 2013-07-29 ENCOUNTER — Encounter: Payer: Self-pay | Admitting: Family Medicine

## 2013-07-29 ENCOUNTER — Ambulatory Visit (INDEPENDENT_AMBULATORY_CARE_PROVIDER_SITE_OTHER): Payer: Medicare Other

## 2013-07-29 ENCOUNTER — Inpatient Hospital Stay
Admission: RE | Admit: 2013-07-29 | Discharge: 2013-07-29 | Disposition: A | Discharge status: Home / Self Care | Payer: Self-pay | Source: Ambulatory Visit | Attending: Family Medicine | Admitting: Family Medicine

## 2013-07-29 VITALS — BP 151/74 | HR 84 | Temp 97.9°F | Ht 69.5 in | Wt 152.8 lb

## 2013-07-29 DIAGNOSIS — S2231XA Fracture of one rib, right side, initial encounter for closed fracture: Secondary | ICD-10-CM

## 2013-07-29 DIAGNOSIS — M25519 Pain in unspecified shoulder: Secondary | ICD-10-CM

## 2013-07-29 DIAGNOSIS — S2239XA Fracture of one rib, unspecified side, initial encounter for closed fracture: Secondary | ICD-10-CM

## 2013-07-29 DIAGNOSIS — M25511 Pain in right shoulder: Secondary | ICD-10-CM

## 2013-07-29 NOTE — Progress Notes (Signed)
   Subjective:    Patient ID: NHUT NAQVI, male    DOB: 03/04/1929, 80 y.o.   MRN: [id number]  HPI  This 80 y.o. male presents for evaluation of recent fall and right rib cage and right anterior chest wall pain from fall.  Xray report shows fractured 5th 6th and 7th ribs right side.  He is having moderate pain. He is not taking any pain meds at present. He does have some oxycodone from when his left index finger was cut off.  He states he has a full bottle of this. He denies any SOB.  He is accompanied by family.  Review of Systems C/o right chest wall and rib pain No chest pain, SOB, HA, dizziness, vision change, N/V, diarrhea, constipation, dysuria, urinary urgency or frequency or rash.     Objective:   Physical Exam   Vital signs noted  Well developed well nourished male.  HEENT - Head atraumatic Normocephalic                Eyes - PERRLA, Conjuctiva - clear Sclera- Clear EOMI Respiratory - Lungs CTA bilateral Cardiac - RRR S1 and S2 without murmur MS - Tenderness to palpation right lateral and anterior chest wall.  No crepitus or paradoxical movements seen.     Assessment & Plan:  Right shoulder pain - Plan: DG Shoulder Right, DG Shoulder Right, Incentive spirometry RT  Right rib fracture - Plan: Incentive spirometry RT  Continue pain meds and follow up if having any breathing or pain problems.  Lysbeth Penner FNP

## 2013-07-30 ENCOUNTER — Other Ambulatory Visit: Payer: Self-pay | Admitting: *Deleted

## 2013-07-30 MED ORDER — HYDROCODONE-ACETAMINOPHEN 5-325 MG PO TABS: 1.0000 | ORAL_TABLET | Freq: Four times a day (QID) | ORAL | Status: DC | PRN

## 2013-08-05 ENCOUNTER — Encounter: Payer: Self-pay | Admitting: Cardiology

## 2013-08-05 ENCOUNTER — Ambulatory Visit (INDEPENDENT_AMBULATORY_CARE_PROVIDER_SITE_OTHER): Payer: Medicare Other | Admitting: Cardiology

## 2013-08-05 ENCOUNTER — Ambulatory Visit (INDEPENDENT_AMBULATORY_CARE_PROVIDER_SITE_OTHER)
Admission: RE | Admit: 2013-08-05 | Discharge: 2013-08-05 | Disposition: A | Discharge status: Home / Self Care | Payer: Medicare Other | Source: Ambulatory Visit | Attending: Cardiology | Admitting: Cardiology

## 2013-08-05 VITALS — BP 140/70 | HR 92 | Ht 68.0 in | Wt 151.1 lb

## 2013-08-05 DIAGNOSIS — I10 Essential (primary) hypertension: Secondary | ICD-10-CM

## 2013-08-05 DIAGNOSIS — I4892 Unspecified atrial flutter: Secondary | ICD-10-CM

## 2013-08-05 LAB — HEPATIC FUNCTION PANEL
ALT: 14 U/L (ref 0–53)
AST: 14 U/L (ref 0–37)
Albumin: 3.4 g/dL — ABNORMAL LOW (ref 3.5–5.2)
Alkaline Phosphatase: 96 U/L (ref 39–117)
BILIRUBIN DIRECT: 0.1 mg/dL (ref 0.0–0.3)
BILIRUBIN TOTAL: 1 mg/dL (ref 0.3–1.2)
Total Protein: 6.2 g/dL (ref 6.0–8.3)

## 2013-08-05 LAB — CBC WITH DIFFERENTIAL/PLATELET
BASOS PCT: 0.5 % (ref 0.0–3.0)
Basophils Absolute: 0 10*3/uL (ref 0.0–0.1)
EOS PCT: 1.2 % (ref 0.0–5.0)
Eosinophils Absolute: 0.1 10*3/uL (ref 0.0–0.7)
HCT: 43.7 % (ref 39.0–52.0)
Hemoglobin: 14.9 g/dL (ref 13.0–17.0)
Lymphocytes Relative: 9.9 % — ABNORMAL LOW (ref 12.0–46.0)
Lymphs Abs: 0.6 10*3/uL — ABNORMAL LOW (ref 0.7–4.0)
MCHC: 34 g/dL (ref 30.0–36.0)
MCV: 97.4 fl (ref 78.0–100.0)
MONO ABS: 0.6 10*3/uL (ref 0.1–1.0)
Monocytes Relative: 9.2 % (ref 3.0–12.0)
NEUTROS ABS: 5.1 10*3/uL (ref 1.4–7.7)
NEUTROS PCT: 79.2 % — AB (ref 43.0–77.0)
Platelets: 188 10*3/uL (ref 150.0–400.0)
RBC: 4.49 Mil/uL (ref 4.22–5.81)
RDW: 15 % — ABNORMAL HIGH (ref 11.5–14.6)
WBC: 6.5 10*3/uL (ref 4.5–10.5)

## 2013-08-05 LAB — TSH: TSH: 6.6 u[IU]/mL — AB (ref 0.35–5.50)

## 2013-08-05 NOTE — Patient Instructions (Signed)
Your physician wants you to follow-up in: Larry Gates will receive a reminder letter in the mail two months in advance. If you don't receive a letter, please call our office to schedule the follow-up appointment.   Your physician recommends that you HAVE LAB WORK TODAY  A chest x-ray takes a picture of the organs and structures inside the chest, including the heart, lungs, and blood vessels. This test can show several things, including, whether the heart is enlarges; whether fluid is building up in the lungs; and whether pacemaker / defibrillator leads are still in place.

## 2013-08-05 NOTE — Assessment & Plan Note (Addendum)
Patient remains in sinus rhythm on examination. Continued amiodarone. Check TSH, liver functions and chest x-ray. Continue Coumadin. Check CBC. We've previously discussed NOAC and patient would prefer Coumadin because of cost. He has some unsteadiness. If he has increasing falls in the future we will need to reweigh the risk versus benefit of Coumadin. The patient lives in [place].I will have him followup with Dr. Percival Spanish in 6 months.

## 2013-08-05 NOTE — Assessment & Plan Note (Signed)
Blood pressure controlled. Continue present medications. 

## 2013-08-05 NOTE — Progress Notes (Signed)
HPI: Pleasant male for fu of atrial flutter. Patient previously admitted to [facility] with chest pain and atrial flutter; also symptoms of a TIA. Echocardiogram showed normal function. TSH normal. Cardiac cath revealed an ejection fraction of 45%, minor nonobstructive coronary disease. Patient placed on rate controlling medications and Coumadin. Had DCCV of atrial flutter on Sep 16 2010. Atrial flutter was felt not easily amenable to ablation. Echo July of 2013 showed normal LV function, mild LVH, grade 1 diastolic dysfunction, mild MR, mild to moderate RAE. TSH 1.64. Patient seen again in Jan 2014 and in recurrent flutter. Placed on amiodarone. DCCV repeated 4/14. I last saw him in Oct of 2014. Since then, The patient had an accident with a table saw and amputated one of his fingers. No dyspnea, chest pain, palpitations or syncope.   Current Outpatient Prescriptions  Medication Sig Dispense Refill  . amiodarone (PACERONE) 200 MG tablet Take 1 tablet (200 mg total) by mouth daily.  30 tablet  0  . diltiazem (CARDIZEM CD) 240 MG 24 hr capsule TAKE ONE CAPSULE BY MOUTH ONCE DAILY  30 capsule  2  . HYDROcodone-acetaminophen (NORCO) 5-325 MG per tablet Take 1 tablet by mouth every 6 (six) hours as needed for moderate pain.  30 tablet  0  . levothyroxine (SYNTHROID) 25 MCG tablet Take 1 tablet (25 mcg total) by mouth daily before breakfast.  30 tablet  12  . Multiple Vitamin (MULTIVITAMIN WITH MINERALS) TABS tablet Take 1 tablet by mouth daily.      [name] warfarin (COUMADIN) 1 MG tablet TAKE ONE TO TWO TABLETS BY MOUTH ONCE DAILY AS  DIRECTED  BY  CLINIC  60 tablet  0  . warfarin (COUMADIN) 5 MG tablet Take 2.5-5 mg by mouth every morning. Takes one-half tablet for 5 days, then takes one whole tablet for 2 days.       No current facility-administered medications for this visit.     Past Medical History  Diagnosis Date  . Prostate cancer   . Hard of hearing   . Vertigo   . Atypical  atrial flutter     a. Dx 07/2010 and coumadin initiated;  b. 08/2010 s/p DCCV.  [name] TIA (transient ischemic attack)   . Midsternal chest pain     a. In setting of A Flutter 07/2010;  b. Cath 07/2010: nonobs dzs  . Anticoagulated on Coumadin     a. Followed @ [facility]    Past Surgical History  Procedure Laterality Date  . Appendectomy    . Tonsillectomy    . Cardioversion  10/14/2011    Procedure: CARDIOVERSION;  Surgeon: Lelon Perla, MD;  Location: [facility];  Service: Cardiovascular;  Laterality: N/A;  . Cardioversion N/A 07/24/2012    Procedure: CARDIOVERSION;  Surgeon: [name], MD;  Location: [place];  Service: Cardiovascular;  Laterality: N/A;  . I&d extremity Left 05/04/2013    Procedure: IRRIGATION AND DEBRIDEMENT of left hand with repair of lacerations and revision amputation of index finger;  Surgeon: Roseanne Kaufman, MD;  Location: [place];  Service: Orthopedics;  Laterality: Left;    History   Social History  . Marital Status: Widowed    Spouse Name: N/A    Number of Children: N/A  . Years of Education: N/A   Occupational History  . Not on file.   Social History Main Topics  . Smoking status: Never Smoker   . Smokeless tobacco: Not on file  . Alcohol Use:  No  . Drug Use: No  . Sexual Activity: No   Other Topics Concern  . Not on file   Social History Narrative   Lives in [place] by himself.  Children live nearby and check in on him daily.    ROS: no fevers or chills, productive cough, hemoptysis, dysphasia, odynophagia, melena, hematochezia, dysuria, hematuria, rash, seizure activity, orthopnea, PND, pedal edema, claudication. Remaining systems are negative.  Physical Exam: Well-developed well-nourished in no acute distress.  Skin is warm and dry.  HEENT is normal.  Neck is supple.  Chest is clear to auscultation with normal expansion.  Cardiovascular exam is regular rate and rhythm.  Abdominal exam nontender or distended. No masses  palpated. Extremities show no edema. neuro grossly intact  ECG 05/04/2013-sinus rhythm, first degree AV block.

## 2013-08-06 ENCOUNTER — Telehealth: Payer: Self-pay | Admitting: Family Medicine

## 2013-08-06 NOTE — Telephone Encounter (Signed)
Confirmed that he does have right posterior rib fractures. Encouraged deep breathing and walking. No pushing, pulling, or lifting.

## 2013-08-16 ENCOUNTER — Other Ambulatory Visit: Payer: Self-pay | Admitting: Cardiology

## 2013-08-23 ENCOUNTER — Other Ambulatory Visit: Payer: Self-pay | Admitting: Cardiology

## 2013-08-26 ENCOUNTER — Encounter: Payer: Self-pay | Admitting: Pharmacist

## 2013-08-26 ENCOUNTER — Ambulatory Visit (INDEPENDENT_AMBULATORY_CARE_PROVIDER_SITE_OTHER): Payer: Medicare Other | Admitting: Pharmacist

## 2013-08-26 DIAGNOSIS — I4891 Unspecified atrial fibrillation: Secondary | ICD-10-CM

## 2013-08-26 DIAGNOSIS — Z79899 Other long term (current) drug therapy: Secondary | ICD-10-CM

## 2013-08-26 LAB — POCT INR: INR: 2.2

## 2013-08-26 NOTE — Progress Notes (Signed)
Anticoagulation visit only see notes

## 2013-08-26 NOTE — Patient Instructions (Signed)
Anticoagulation Dose Instructions as of 08/26/2013     [name] Tue Wed Thu Fri Sat   New Dose 2.5 mg 1 mg 2.5 mg 2.5 mg 2.5 mg 1 mg 2.5 mg    Description       Continue 1/2 tablet of 5mg  daily except take 1 tablet of 1mg  warfarin on mondays and fridays      INR was 2.2 today

## 2013-08-26 NOTE — Addendum Note (Signed)
Addended by: Earlene Plater on: 08/26/2013 11:48 AM   Modules accepted: Orders

## 2013-08-27 LAB — T4, FREE: Free T4: 1.04 ng/dL (ref 0.82–1.77)

## 2013-08-27 LAB — T3, FREE: T3 FREE: 3 pg/mL (ref 2.0–4.4)

## 2013-09-07 ENCOUNTER — Encounter: Payer: Self-pay | Admitting: Family

## 2013-09-07 ENCOUNTER — Ambulatory Visit (INDEPENDENT_AMBULATORY_CARE_PROVIDER_SITE_OTHER): Payer: Medicare Other | Admitting: Family

## 2013-09-07 VITALS — BP 168/80 | HR 71 | Temp 96.7°F | Wt 150.4 lb

## 2013-09-07 DIAGNOSIS — W57XXXA Bitten or stung by nonvenomous insect and other nonvenomous arthropods, initial encounter: Secondary | ICD-10-CM

## 2013-09-07 DIAGNOSIS — S40269A Insect bite (nonvenomous) of unspecified shoulder, initial encounter: Secondary | ICD-10-CM

## 2013-09-07 DIAGNOSIS — S40861A Insect bite (nonvenomous) of right upper arm, initial encounter: Secondary | ICD-10-CM

## 2013-09-07 MED ORDER — DOXYCYCLINE HYCLATE 100 MG PO TABS: 100.0000 mg | ORAL_TABLET | Freq: Two times a day (BID) | ORAL | Status: DC

## 2013-09-07 NOTE — Patient Instructions (Signed)
Tick Bite Information Ticks are insects that attach themselves to the skin and draw blood for food. There are various types of ticks. Common types include wood ticks and deer ticks. Most ticks live in shrubs and grassy areas. Ticks can climb onto your body when you make contact with leaves or grass where the tick is waiting. The most common places on the body for ticks to attach themselves are the scalp, neck, armpits, waist, and groin. Most tick bites are harmless, but sometimes ticks carry germs that cause diseases. These germs can be spread to a person during the tick's feeding process. The chance of a disease spreading through a tick bite depends on:   The type of tick.  Time of year.   How long the tick is attached.   Geographic location.  HOW CAN YOU PREVENT TICK BITES? Take these steps to help prevent tick bites when you are outdoors:  Wear protective clothing. Long sleeves and long pants are best.   Wear white clothes so you can see ticks more easily.  Tuck your pant legs into your socks.   If walking on a trail, stay in the middle of the trail to avoid brushing against bushes.  Avoid walking through areas with long grass.  Put insect repellent on all exposed skin and along boot tops, pant legs, and sleeve cuffs.   Check clothing, hair, and skin repeatedly and before going inside.   Brush off any ticks that are not attached.  Take a shower or bath as soon as possible after being outdoors.  WHAT IS THE PROPER WAY TO REMOVE A TICK? Ticks should be removed as soon as possible to help prevent diseases caused by tick bites. 1. If latex gloves are available, put them on before trying to remove a tick.  2. Using fine-point tweezers, grasp the tick as close to the skin as possible. You may also use curved forceps or a tick removal tool. Grasp the tick as close to its head as possible. Avoid grasping the tick on its body. 3. Pull gently with steady upward pressure until  the tick lets go. Do not twist the tick or jerk it suddenly. This may break off the tick's head or mouth parts. 4. Do not squeeze or crush the tick's body. This could force disease-carrying fluids from the tick into your body.  5. After the tick is removed, wash the bite area and your hands with soap and water or other disinfectant such as alcohol. 6. Apply a small amount of antiseptic cream or ointment to the bite site.  7. Wash and disinfect any instruments that were used.  Do not try to remove a tick by applying a hot match, petroleum jelly, or fingernail polish to the tick. These methods do not work and may increase the chances of disease being spread from the tick bite.  WHEN SHOULD YOU SEEK MEDICAL CARE? Contact your health care provider if you are unable to remove a tick from your skin or if a part of the tick breaks off and is stuck in the skin.  After a tick bite, you need to be aware of signs and symptoms that could be related to diseases spread by ticks. Contact your health care provider if you develop any of the following in the days or weeks after the tick bite:  Unexplained fever.  Rash. A circular rash that appears days or weeks after the tick bite may indicate the possibility of Lyme disease. The rash may resemble   a target with a bull's-eye and may occur at a different part of your body than the tick bite.  Redness and swelling in the area of the tick bite.   Tender, swollen lymph glands.   Diarrhea.   Weight loss.   Cough.   Fatigue.   Muscle, joint, or bone pain.   Abdominal pain.   Headache.   Lethargy or a change in your level of consciousness.  Difficulty walking or moving your legs.   Numbness in the legs.   Paralysis.  Shortness of breath.   Confusion.   Repeated vomiting.  Document Released: 04/01/2000 Document Revised: 01/23/2013 Document Reviewed: 09/12/2012 [employer] Patient Information 2014 [employer].  

## 2013-09-07 NOTE — Progress Notes (Addendum)
   Subjective:    [name], male    DOB: Mar 30, 1929, 80 y.o.   MRN: [id number]  HPI Pt presents to the office after removing a tick yesterday. Pt states his arm was itching yesterday morning, but didn't notice anything there. His son looked at his arm yesterday evening and removed it. After removing the tick, pt states it became red. Pt denies any pain, fever, or joint pain at this time.    Review of Systems  Constitutional: Negative.   HENT: Negative.   Respiratory: Negative.   Cardiovascular: Negative.   Genitourinary: Negative.   All other systems reviewed and are negative.      Objective:   Physical Exam  Constitutional: He is oriented to person, place, and time. He appears well-developed and well-nourished.  Cardiovascular: Normal rate, regular rhythm, normal heart sounds and intact distal pulses.   No murmur heard. Pulmonary/Chest: Effort normal and breath sounds normal. No respiratory distress. He has no wheezes.  Musculoskeletal: Normal range of motion.  Neurological: He is alert and oriented to person, place, and time.  Skin: Skin is warm and dry. There is erythema.  Pt's right upper arm is a circular erythemas patch approx 2inX3in.  Psychiatric: He has a normal mood and affect. His behavior is normal. Judgment and thought content normal.     BP 168/80  Pulse 71  Temp(Src) 96.7 F (35.9 C) (Oral)  Wt 150 lb 6.4 oz (68.221 kg)      Assessment & Plan:  1. Tick bite of right upper arm -Monitor site for s/s of infection -Appointment with Tammy on Tuesday to check INR bc of risk of doxycycline and warfarin interaction -Pt told to only take 1 mg of warfarin Sunday, 1 mg Monday, and hold off on dose on Tuesday until appointment with Pharm -Report any joint pain, fevers, or rash - doxycycline (VIBRA-TABS) 100 MG tablet; Take 1 tablet (100 mg total) by mouth 2 (two) times daily.  Dispense: 20 tablet; Refill: 0  Evelina Dun, FNP

## 2013-09-10 ENCOUNTER — Ambulatory Visit (INDEPENDENT_AMBULATORY_CARE_PROVIDER_SITE_OTHER): Payer: Medicare Other | Admitting: Pharmacist Clinician (PhC)/ Clinical Pharmacy Specialist

## 2013-09-10 DIAGNOSIS — I4891 Unspecified atrial fibrillation: Secondary | ICD-10-CM

## 2013-09-10 LAB — POCT INR: INR: 2

## 2013-11-12 ENCOUNTER — Ambulatory Visit (INDEPENDENT_AMBULATORY_CARE_PROVIDER_SITE_OTHER): Payer: Medicare Other | Admitting: Pharmacist Clinician (PhC)/ Clinical Pharmacy Specialist

## 2013-11-12 DIAGNOSIS — I4891 Unspecified atrial fibrillation: Secondary | ICD-10-CM

## 2013-11-12 DIAGNOSIS — I482 Chronic atrial fibrillation, unspecified: Secondary | ICD-10-CM

## 2013-11-12 DIAGNOSIS — Z8546 Personal history of malignant neoplasm of prostate: Secondary | ICD-10-CM

## 2013-11-12 LAB — POCT INR: INR: 2.1

## 2013-11-12 NOTE — Addendum Note (Signed)
Addended by: Selmer Dominion on: 11/12/2013 08:13 AM   Modules accepted: Orders

## 2013-11-13 LAB — PSA, TOTAL AND FREE
PSA, Free Pct: 11.7 %
PSA, Free: 0.07 ng/mL
PSA: 0.6 ng/mL (ref 0.0–4.0)

## 2013-12-31 ENCOUNTER — Ambulatory Visit (INDEPENDENT_AMBULATORY_CARE_PROVIDER_SITE_OTHER): Payer: Medicare Other | Admitting: Pharmacist Clinician (PhC)/ Clinical Pharmacy Specialist

## 2013-12-31 DIAGNOSIS — I4891 Unspecified atrial fibrillation: Secondary | ICD-10-CM

## 2013-12-31 DIAGNOSIS — I482 Chronic atrial fibrillation, unspecified: Secondary | ICD-10-CM

## 2013-12-31 LAB — POCT INR: INR: 2.4

## 2014-01-14 ENCOUNTER — Telehealth: Payer: Self-pay | Admitting: Pharmacist Clinician (PhC)/ Clinical Pharmacy Specialist

## 2014-01-14 ENCOUNTER — Other Ambulatory Visit: Payer: Self-pay | Admitting: Pharmacist Clinician (PhC)/ Clinical Pharmacy Specialist

## 2014-01-14 DIAGNOSIS — I482 Chronic atrial fibrillation, unspecified: Secondary | ICD-10-CM

## 2014-01-14 MED ORDER — WARFARIN SODIUM 5 MG PO TABS: 2.5000 mg | ORAL_TABLET | Freq: Every morning | ORAL | Status: DC

## 2014-01-14 NOTE — Telephone Encounter (Signed)
Warfarin refill needed. Called into [facility] for a #90 day supply and PRN refills for one year-

## 2014-01-17 ENCOUNTER — Other Ambulatory Visit: Payer: Self-pay | Admitting: *Deleted

## 2014-01-17 ENCOUNTER — Ambulatory Visit (INDEPENDENT_AMBULATORY_CARE_PROVIDER_SITE_OTHER): Payer: Medicare Other

## 2014-01-17 DIAGNOSIS — Z23 Encounter for immunization: Secondary | ICD-10-CM

## 2014-01-17 MED ORDER — WARFARIN SODIUM 1 MG PO TABS: ORAL_TABLET | ORAL | Status: DC

## 2014-01-31 NOTE — Telephone Encounter (Signed)
Nothing was typed/tmj 

## 2014-02-13 ENCOUNTER — Telehealth: Payer: Self-pay | Admitting: Pharmacist

## 2014-02-13 NOTE — Telephone Encounter (Signed)
Patient is in need of recheck INR.  Spoke with son and appt made fro 02/18/14 at 9:40pm.

## 2014-02-18 ENCOUNTER — Ambulatory Visit (INDEPENDENT_AMBULATORY_CARE_PROVIDER_SITE_OTHER): Payer: Medicare Other | Admitting: Pharmacist Clinician (PhC)/ Clinical Pharmacy Specialist

## 2014-02-18 ENCOUNTER — Encounter (INDEPENDENT_AMBULATORY_CARE_PROVIDER_SITE_OTHER): Payer: Self-pay

## 2014-02-18 DIAGNOSIS — I482 Chronic atrial fibrillation, unspecified: Secondary | ICD-10-CM

## 2014-02-18 DIAGNOSIS — I4891 Unspecified atrial fibrillation: Secondary | ICD-10-CM

## 2014-02-18 LAB — POCT INR: INR: 2

## 2014-04-23 ENCOUNTER — Other Ambulatory Visit (INDEPENDENT_AMBULATORY_CARE_PROVIDER_SITE_OTHER): Payer: Medicare Other

## 2014-04-23 ENCOUNTER — Encounter: Payer: Self-pay | Admitting: *Deleted

## 2014-04-23 ENCOUNTER — Ambulatory Visit (INDEPENDENT_AMBULATORY_CARE_PROVIDER_SITE_OTHER): Payer: Medicare Other | Admitting: Cardiology

## 2014-04-23 VITALS — BP 182/92 | HR 80 | Ht 68.0 in | Wt 146.0 lb

## 2014-04-23 DIAGNOSIS — I4891 Unspecified atrial fibrillation: Secondary | ICD-10-CM

## 2014-04-23 MED ORDER — LEVOTHYROXINE SODIUM 25 MCG PO TABS: 25.0000 ug | ORAL_TABLET | Freq: Every day | ORAL | Status: DC

## 2014-04-23 MED ORDER — AMIODARONE HCL 200 MG PO TABS: 200.0000 mg | ORAL_TABLET | Freq: Every day | ORAL | Status: DC

## 2014-04-23 MED ORDER — DILTIAZEM HCL ER COATED BEADS 240 MG PO CP24: 240.0000 mg | ORAL_CAPSULE | Freq: Every day | ORAL | Status: DC

## 2014-04-23 NOTE — Patient Instructions (Addendum)
The current medical regimen is effective;  continue present plan and medications.  Please have blood work today at [facility]  (CBC,TSH and Hepatic panel)   Follow up in 4 months with Dr. Percival Spanish.  You will receive a letter in the mail 2 months before you are due.  Please call us when you receive this letter to schedule your follow up appointment.  Thank you for choosing [facility]!!

## 2014-04-23 NOTE — Progress Notes (Signed)
HPI The patient has a history of atrial flutter.  He has had a mildly reduce EF but only minor CAD on cath in 2012.  His most recent EF was normal with some LVH. He has ahd cardioversion x 2.  He seems to have maintained NSR on amiodarone.  He was following with Dr. Stanford Breed but lives in this area and so is being seen here.  Since he was last seen he has done well.  The patient denies any new symptoms such as chest discomfort, neck or arm discomfort. There has been no new shortness of breath, PND or orthopnea. There have been no reported palpitations, presyncope or syncope.  Of note he never felt his palpitations before.  He did run out of his Cardizem and thyroid medication.    Allergies  Allergen Reactions  . Penicillins     Current Outpatient Prescriptions  Medication Sig Dispense Refill  . warfarin (COUMADIN) 1 MG tablet TAKE ONE TO TWO TABLETS BY MOUTH ONCE DAILY AS  DIRECTED  BY  CLINIC 60 tablet 5  . warfarin (COUMADIN) 5 MG tablet Take 0.5-1 tablets (2.5-5 mg total) by mouth every morning. Takes one-half tablet for 5 days, then takes one whole tablet for 2 days. 90 tablet 3  . amiodarone (PACERONE) 200 MG tablet TAKE ONE TABLET BY MOUTH ONCE DAILY (Patient not taking: Reported on 04/23/2014) 30 tablet 5  . diltiazem (CARDIZEM CD) 240 MG 24 hr capsule TAKE ONE CAPSULE BY MOUTH ONCE DAILY (Patient not taking: Reported on 04/23/2014) 30 capsule 5  . HYDROcodone-acetaminophen (NORCO) 5-325 MG per tablet Take 1 tablet by mouth every 6 (six) hours as needed for moderate pain. (Patient not taking: Reported on 04/23/2014) 30 tablet 0  . levothyroxine (SYNTHROID) 25 MCG tablet Take 1 tablet (25 mcg total) by mouth daily before breakfast. (Patient not taking: Reported on 04/23/2014) 30 tablet 12  . Multiple Vitamin (MULTIVITAMIN WITH MINERALS) TABS tablet Take 1 tablet by mouth daily.     No current facility-administered medications for this visit.    Past Medical History  Diagnosis Date  .  Prostate cancer   . Hard of hearing   . Vertigo   . Atypical atrial flutter     a. Dx 07/2010 and coumadin initiated;  b. 08/2010 s/p DCCV.  [name] TIA (transient ischemic attack)   . Midsternal chest pain     a. In setting of A Flutter 07/2010;  b. Cath 07/2010: nonobs dzs  . Anticoagulated on Coumadin     a. Followed @ [facility]    Past Surgical History  Procedure Laterality Date  . Appendectomy    . Tonsillectomy    . Cardioversion  10/14/2011    Procedure: CARDIOVERSION;  Surgeon: Lelon Perla, MD;  Location: [facility];  Service: Cardiovascular;  Laterality: N/A;  . Cardioversion N/A 07/24/2012    Procedure: CARDIOVERSION;  Surgeon: [name], MD;  Location: [facility];  Service: Cardiovascular;  Laterality: N/A;  . I&d extremity Left 05/04/2013    Procedure: IRRIGATION AND DEBRIDEMENT of left hand with repair of lacerations and revision amputation of index finger;  Surgeon: Roseanne Kaufman, MD;  Location: [facility];  Service: Orthopedics;  Laterality: Left;    ROS:  As stated in the HPI and negative for all other systems.  PHYSICAL EXAM BP 182/92 mmHg  Pulse 80  Ht 5\' 8"  (1.727 m)  Wt 146 lb (66.225 kg)  BMI 22.20 kg/m2 GENERAL:  Well appearing HEENT:  Pupils equal round and  reactive, fundi not visualized, oral mucosa unremarkable NECK:  No jugular venous distention, waveform within normal limits, carotid upstroke brisk and symmetric, no bruits, no thyromegaly LYMPHATICS:  No cervical, inguinal adenopathy LUNGS:  Clear to auscultation bilaterally BACK:  No CVA tenderness CHEST:  Unremarkable HEART:  PMI not displaced or sustained,S1 and S2 within normal limits, no S3, no S4, no clicks, no rubs, no murmurs ABD:  Flat, positive bowel sounds normal in frequency in pitch, no bruits, no rebound, no guarding, no midline pulsatile mass, no hepatomegaly, no splenomegaly EXT:  2 plus pulses throughout, no edema, no cyanosis no clubbing SKIN:  No rashes no nodules NEURO:  Cranial nerves II  through XII grossly intact, motor grossly intact throughout PSYCH:  Cognitively intact, oriented to person place and time  EKG:  NSR, rate 80, axis WNL, intervals WNL, poor anterior R wave progression, no acute ST T wave changes.  Atrial arrhythmia  04/23/2014  ASSESSMENT AND PLAN  ATRIAL FLUTTER:  He seems to be maintaining NSR although it is hard to now.  I will keep him on the meds as listed.  He has no desire to change to a NOAC.  I will order follow up TSH, CBC and liver profile.    HTN:  His BP is not at target but he has been out of his meds. I will renew these and he will keep a blood pressure diary.  Further adjustments will likely be needed.

## 2014-04-24 LAB — HEPATIC FUNCTION PANEL
ALT: 10 IU/L (ref 0–44)
AST: 13 IU/L (ref 0–40)
Albumin: 4.2 g/dL (ref 3.5–4.7)
Alkaline Phosphatase: 90 IU/L (ref 39–117)
BILIRUBIN DIRECT: 0.25 mg/dL (ref 0.00–0.40)
Total Bilirubin: 0.8 mg/dL (ref 0.0–1.2)
Total Protein: 6.4 g/dL (ref 6.0–8.5)

## 2014-04-24 LAB — CBC WITH DIFFERENTIAL
Basophils Absolute: 0 10*3/uL (ref 0.0–0.2)
Basos: 0 %
EOS ABS: 0.1 10*3/uL (ref 0.0–0.4)
Eos: 2 %
HCT: 44.4 % (ref 37.5–51.0)
Hemoglobin: 15.8 g/dL (ref 12.6–17.7)
IMMATURE GRANS (ABS): 0 10*3/uL (ref 0.0–0.1)
Immature Granulocytes: 0 %
LYMPHS: 15 %
Lymphocytes Absolute: 0.7 10*3/uL (ref 0.7–3.1)
MCH: 33 pg (ref 26.6–33.0)
MCHC: 35.6 g/dL (ref 31.5–35.7)
MCV: 93 fL (ref 79–97)
Monocytes Absolute: 0.4 10*3/uL (ref 0.1–0.9)
Monocytes: 9 %
NEUTROS PCT: 74 %
Neutrophils Absolute: 3.5 10*3/uL (ref 1.4–7.0)
Platelets: 164 10*3/uL (ref 150–379)
RBC: 4.79 x10E6/uL (ref 4.14–5.80)
RDW: 14 % (ref 12.3–15.4)
WBC: 4.7 10*3/uL (ref 3.4–10.8)

## 2014-04-24 LAB — TSH: TSH: 10.89 u[IU]/mL — ABNORMAL HIGH (ref 0.450–4.500)

## 2014-04-28 ENCOUNTER — Other Ambulatory Visit: Payer: Self-pay

## 2014-04-28 DIAGNOSIS — R7989 Other specified abnormal findings of blood chemistry: Secondary | ICD-10-CM

## 2014-04-28 DIAGNOSIS — I10 Essential (primary) hypertension: Secondary | ICD-10-CM

## 2014-04-28 MED ORDER — LEVOTHYROXINE SODIUM 50 MCG PO TABS: 50.0000 ug | ORAL_TABLET | Freq: Every day | ORAL | Status: DC

## 2014-04-29 ENCOUNTER — Telehealth: Payer: Self-pay | Admitting: Cardiology

## 2014-04-29 NOTE — Telephone Encounter (Signed)
Please call,question about his Diltiazem.

## 2014-04-29 NOTE — Telephone Encounter (Signed)
Returned call to patient's son. He is unable to sleep since taking "cartia xt" - change in manufacturer. Called pharmacy and they state they ordered diltiazem from the previous manufacturer and it will be in tomorrow. Patient is not due for a refill 05/16/14. Informed son of this and that if he needs to get an early fill on Rx it will be out of pocket. Son voiced understanding

## 2014-05-01 ENCOUNTER — Ambulatory Visit (INDEPENDENT_AMBULATORY_CARE_PROVIDER_SITE_OTHER): Payer: Medicare Other | Admitting: Pharmacist

## 2014-05-01 DIAGNOSIS — I4891 Unspecified atrial fibrillation: Secondary | ICD-10-CM

## 2014-05-01 DIAGNOSIS — I482 Chronic atrial fibrillation, unspecified: Secondary | ICD-10-CM

## 2014-05-01 LAB — POCT INR: INR: 1.5

## 2014-05-01 NOTE — Patient Instructions (Signed)
Anticoagulation Dose Instructions as of 05/01/2014      Larry Gates Tue Wed Thu Fri Sat   New Dose 2.5 mg 1 mg 2.5 mg 2.5 mg 2.5 mg 1 mg 2.5 mg    Description        Take 1 whole 5mg  tablet today - Thursday, January 14th, then continue 1/2 tablet of 5mg  daily except take 1 tablet of 1mg  warfarin on mondays and fridays     INR was 1.5 today

## 2014-05-21 ENCOUNTER — Ambulatory Visit (INDEPENDENT_AMBULATORY_CARE_PROVIDER_SITE_OTHER): Payer: Medicare Other | Admitting: Pharmacist

## 2014-05-21 DIAGNOSIS — I4891 Unspecified atrial fibrillation: Secondary | ICD-10-CM

## 2014-05-21 DIAGNOSIS — I482 Chronic atrial fibrillation, unspecified: Secondary | ICD-10-CM

## 2014-05-21 LAB — POCT INR: INR: 2.2

## 2014-05-21 NOTE — Patient Instructions (Signed)
Anticoagulation Dose Instructions as of 05/21/2014      Larry Gates Tue Wed Thu Fri Sat   New Dose 2.5 mg 1 mg 2.5 mg 2.5 mg 2.5 mg 1 mg 2.5 mg    Description        Continue 1/2 tablet of 5mg  daily except take 1 tablet of 5mg  warfarin on mondays and fridays      INR was 2.2 today

## 2014-05-27 ENCOUNTER — Telehealth: Payer: Self-pay | Admitting: Cardiology

## 2014-05-27 MED ORDER — DILTIAZEM HCL ER COATED BEADS 240 MG PO CP24: ORAL_CAPSULE | ORAL | Status: DC

## 2014-05-27 NOTE — Telephone Encounter (Signed)
[facility] in [place], spoke with the pharmacy which  states  That pt has Cardizem 240 mg, but pt states this medication makes him sick. Pt has not taken this medication for one week. Pt would like to be changed to Diltiazem ER CD 240 mg. A prescription was given for Diltiazem ER CD 240 mg once daily, dispense 30 pills and 6 refills.

## 2014-05-27 NOTE — Telephone Encounter (Signed)
Refill Request:   Refill needed on Diltiazem only sent to [facility].

## 2014-06-12 ENCOUNTER — Ambulatory Visit: Payer: Self-pay | Admitting: Family Medicine

## 2014-06-26 ENCOUNTER — Encounter: Payer: Self-pay | Admitting: *Deleted

## 2014-07-08 LAB — BASIC METABOLIC PANEL
BUN: 16 mg/dL (ref 6–23)
CALCIUM: 9 mg/dL (ref 8.4–10.5)
CO2: 26 meq/L (ref 19–32)
Chloride: 104 mEq/L (ref 96–112)
Creat: 0.92 mg/dL (ref 0.50–1.35)
GLUCOSE: 135 mg/dL — AB (ref 70–99)
POTASSIUM: 4.4 meq/L (ref 3.5–5.3)
Sodium: 141 mEq/L (ref 135–145)

## 2014-07-08 LAB — TSH: TSH: 6.252 u[IU]/mL — AB (ref 0.350–4.500)

## 2014-07-09 ENCOUNTER — Ambulatory Visit (INDEPENDENT_AMBULATORY_CARE_PROVIDER_SITE_OTHER): Payer: Medicare Other | Admitting: Family Medicine

## 2014-07-09 ENCOUNTER — Encounter: Payer: Self-pay | Admitting: Family Medicine

## 2014-07-09 VITALS — BP 182/85 | HR 85 | Temp 97.0°F | Ht 68.0 in | Wt 152.2 lb

## 2014-07-09 DIAGNOSIS — I10 Essential (primary) hypertension: Secondary | ICD-10-CM | POA: Diagnosis not present

## 2014-07-09 DIAGNOSIS — I482 Chronic atrial fibrillation, unspecified: Secondary | ICD-10-CM

## 2014-07-09 DIAGNOSIS — E785 Hyperlipidemia, unspecified: Secondary | ICD-10-CM

## 2014-07-09 LAB — POCT CBC
Granulocyte percent: 80.9 %G — AB (ref 37–80)
HEMATOCRIT: 48.1 % (ref 43.5–53.7)
Hemoglobin: 15.5 g/dL (ref 14.1–18.1)
Lymph, poc: 0.8 (ref 0.6–3.4)
MCH, POC: 30.8 pg (ref 27–31.2)
MCHC: 32.2 g/dL (ref 31.8–35.4)
MCV: 95.9 fL (ref 80–97)
MPV: 8.7 fL (ref 0–99.8)
POC GRANULOCYTE: 4.5 (ref 2–6.9)
POC LYMPH PERCENT: 14 %L (ref 10–50)
Platelet Count, POC: 153 10*3/uL (ref 142–424)
RBC: 5.01 M/uL (ref 4.69–6.13)
RDW, POC: 15.2 %
WBC: 5.6 10*3/uL (ref 4.6–10.2)

## 2014-07-09 LAB — POCT INR: INR: 1.8

## 2014-07-09 NOTE — Progress Notes (Signed)
Subjective:  [name], male    DOB: [birth year]  Age: 81 y.o. MRN: [id number]  CC: Hypertension; Hyperlipidemia; Atrial Fibrillation; and Hypothyroidism   HPI Larry Gates presents for  follow-up of hypertension. Patient has no history of headache chest pain or shortness of breath or recent cough. Patient also denies symptoms of TIA such as numbness weakness lateralizing. Patient checks  blood pressure at home and has not had any elevated readings recently. Patient denies side effects from his medication. States taking it regularly.   Patient in for follow-up of elevated cholesterol. Doing well without complaints on current medication. Denies side effects of statin including myalgia and arthralgia and nausea. Also in today for liver function testing. Currently no chest pain, shortness of breath or other cardiovascular related symptoms noted. .  Follow-up atrial fibrillation. Patient denies any abnormal leading or bruising. Specifically no bleeding from the gums, nose, rectum, with urination. He has not experienced excessive palpitations. There has been no recent chest pain. History Larry Gates has a past medical history of Prostate cancer; Hard of hearing; Vertigo; Atypical atrial flutter; TIA (transient ischemic attack); Midsternal chest pain; and Anticoagulated on Coumadin.   He has past surgical history that includes Appendectomy; Tonsillectomy; Cardioversion (10/14/2011); Cardioversion (N/A, 07/24/2012); and I&D extremity (Left, 05/04/2013).   His family history includes Alzheimer's disease in his mother; CVA in his father; Heart Problems in his brother.He reports that he has never smoked. He does not have any smokeless tobacco history on file. He reports that he does not drink alcohol or use illicit drugs.  Current Outpatient Prescriptions on File Prior to Visit  Medication Sig Dispense Refill  . amiodarone (PACERONE) 200 MG tablet Take 1 tablet (200 mg total) by mouth daily. 30  tablet 6  . diltiazem (CARDIZEM CD) 240 MG 24 hr capsule Pt takes Diltiazem ER CD 240 mg once daily. 30 capsule 6  . Multiple Vitamin (MULTIVITAMIN WITH MINERALS) TABS tablet Take 1 tablet by mouth daily.    [name] warfarin (COUMADIN) 1 MG tablet TAKE ONE TO TWO TABLETS BY MOUTH ONCE DAILY AS  DIRECTED  BY  CLINIC 60 tablet 5  . warfarin (COUMADIN) 5 MG tablet Take 0.5-1 tablets (2.5-5 mg total) by mouth every morning. Takes one-half tablet for 5 days, then takes one whole tablet for 2 days. 90 tablet 3   No current facility-administered medications on file prior to visit.    ROS Review of Systems  Constitutional: Negative for fever, chills, diaphoresis and unexpected weight change.  HENT: Negative for congestion, hearing loss, rhinorrhea, sore throat and trouble swallowing.   Respiratory: Negative for cough, chest tightness, shortness of breath and wheezing.   Gastrointestinal: Negative for nausea, vomiting, abdominal pain, diarrhea, constipation and abdominal distention.  Endocrine: Negative for cold intolerance and heat intolerance.  Genitourinary: Negative for dysuria, hematuria and flank pain.  Musculoskeletal: Negative for joint swelling and arthralgias.  Skin: Negative for rash.  Neurological: Negative for dizziness and headaches.  Psychiatric/Behavioral: Negative for dysphoric mood, decreased concentration and agitation. The patient is not nervous/anxious.     Objective:  BP 182/85 mmHg  Pulse 85  Temp(Src) 97 F (36.1 C) (Oral)  Ht _0  (1.727 m)  Wt 152 lb 3.2 oz (69.037 kg)  BMI 23.15 kg/m2  Physical Exam  Constitutional: He is oriented to person, place, and time. He appears well-developed and well-nourished. No distress.  HENT:  Head: Normocephalic and atraumatic.  Right Ear: External ear normal.  Left Ear: External  ear normal.  Nose: Nose normal.  Mouth/Throat: Oropharynx is clear and moist.  Eyes: Conjunctivae and EOM are normal. Pupils are equal, round, and reactive  to light.  Neck: Normal range of motion. Neck supple. No thyromegaly present.  Cardiovascular: Normal rate, regular rhythm and normal heart sounds.   No murmur heard. Pulmonary/Chest: Effort normal and breath sounds normal. No respiratory distress. He has no wheezes. He has no rales.  Abdominal: Soft. Bowel sounds are normal. He exhibits no distension. There is no tenderness.  Lymphadenopathy:    He has no cervical adenopathy.  Neurological: He is alert and oriented to person, place, and time. He has normal reflexes.  Skin: Skin is warm and dry.  Psychiatric: He has a normal mood and affect. His behavior is normal. Judgment and thought content normal.   Results for orders placed or performed in visit on 07/09/14  CMP14+EGFR  Result Value Ref Range   Glucose 96 65 - 99 mg/dL   BUN 17 8 - 27 mg/dL   Creatinine, Ser 0.95 0.76 - 1.27 mg/dL   GFR calc non Af Amer 74 >59 mL/min/1.73   GFR calc Af Amer 85 >59 mL/min/1.73   BUN/Creatinine Ratio 18 10 - 22   Sodium 143 134 - 144 mmol/L   Potassium 4.4 3.5 - 5.2 mmol/L   Chloride 106 97 - 108 mmol/L   CO2 24 18 - 29 mmol/L   Calcium 9.0 8.6 - 10.2 mg/dL   Total Protein 6.0 6.0 - 8.5 g/dL   Albumin 4.0 3.5 - 4.7 g/dL   Globulin, Total 2.0 1.5 - 4.5 g/dL   Albumin/Globulin Ratio 2.0 1.1 - 2.5   Bilirubin Total 0.7 0.0 - 1.2 mg/dL   Alkaline Phosphatase 90 39 - 117 IU/L   AST 13 0 - 40 IU/L   ALT 10 0 - 44 IU/L  Lipid panel  Result Value Ref Range   Cholesterol, Total 145 100 - 199 mg/dL   Triglycerides 71 0 - 149 mg/dL   HDL 55 >39 mg/dL   VLDL Cholesterol Cal 14 5 - 40 mg/dL   LDL Calculated 76 0 - 99 mg/dL   Chol/HDL Ratio 2.6 0.0 - 5.0 ratio units  POCT CBC  Result Value Ref Range   WBC 5.6 4.6 - 10.2 K/uL   Lymph, poc 0.8 0.6 - 3.4   POC LYMPH PERCENT 14.0 10 - 50 %L   POC Granulocyte 4.5 2 - 6.9   Granulocyte percent 80.9 (A) 37 - 80 %G   RBC 5.01 4.69 - 6.13 M/uL   Hemoglobin 15.5 14.1 - 18.1 g/dL   HCT, POC 48.1 43.5 -  53.7 %   MCV 95.9 80 - 97 fL   MCH, POC 30.8 27 - 31.2 pg   MCHC 32.2 31.8 - 35.4 g/dL   RDW, POC 15.2 %   Platelet Count, POC 153 142 - 424 K/uL   MPV 8.7 0 - 99.8 fL  POCT INR  Result Value Ref Range   INR 1.8     Assessment & Plan:   Larry Gates was seen today for hypertension, hyperlipidemia, atrial fibrillation and hypothyroidism.  Diagnoses and all orders for this visit:  Chronic atrial fibrillation Orders: -     EKG 12-Lead -     CMP14+EGFR -     POCT INR  Hyperlipidemia Orders: -     Lipid panel  Essential hypertension Orders: -     POCT CBC   I am having Larry Gates maintain his  multivitamin with minerals, warfarin, warfarin, amiodarone, and diltiazem.  No orders of the defined types were placed in this encounter.     Follow-up: Return in about 3 months (around 10/09/2014).  Claretta Fraise, M.D.

## 2014-07-10 ENCOUNTER — Other Ambulatory Visit: Payer: Self-pay | Admitting: Family Medicine

## 2014-07-10 LAB — CMP14+EGFR
A/G RATIO: 2 (ref 1.1–2.5)
ALT: 10 IU/L (ref 0–44)
AST: 13 IU/L (ref 0–40)
Albumin: 4 g/dL (ref 3.5–4.7)
Alkaline Phosphatase: 90 IU/L (ref 39–117)
BILIRUBIN TOTAL: 0.7 mg/dL (ref 0.0–1.2)
BUN/Creatinine Ratio: 18 (ref 10–22)
BUN: 17 mg/dL (ref 8–27)
CHLORIDE: 106 mmol/L (ref 97–108)
CO2: 24 mmol/L (ref 18–29)
Calcium: 9 mg/dL (ref 8.6–10.2)
Creatinine, Ser: 0.95 mg/dL (ref 0.76–1.27)
GFR calc Af Amer: 85 mL/min/{1.73_m2} (ref >59)
GFR, EST NON AFRICAN AMERICAN: 74 mL/min/{1.73_m2} (ref >59)
Globulin, Total: 2 g/dL (ref 1.5–4.5)
Glucose: 96 mg/dL (ref 65–99)
POTASSIUM: 4.4 mmol/L (ref 3.5–5.2)
Sodium: 143 mmol/L (ref 134–144)
Total Protein: 6 g/dL (ref 6.0–8.5)

## 2014-07-10 LAB — LIPID PANEL
CHOL/HDL RATIO: 2.6 ratio (ref 0.0–5.0)
Cholesterol, Total: 145 mg/dL (ref 100–199)
HDL: 55 mg/dL (ref >39)
LDL Calculated: 76 mg/dL (ref 0–99)
Triglycerides: 71 mg/dL (ref 0–149)
VLDL CHOLESTEROL CAL: 14 mg/dL (ref 5–40)

## 2014-07-10 MED ORDER — LEVOTHYROXINE SODIUM 75 MCG PO TABS: 75.0000 ug | ORAL_TABLET | Freq: Every day | ORAL | Status: DC

## 2014-07-21 ENCOUNTER — Telehealth: Payer: Self-pay | Admitting: Cardiology

## 2014-07-21 NOTE — Telephone Encounter (Signed)
Patient had Rx on file at pharmacy for levothyroxine 66mcg Rx'ed by Dr. Percival Spanish from Jan 2016. Labs were ordered and Dr. Percival Spanish had instructed patient to increased to 71mcg on 04/23/14. Repeat TSH done on 3/21 and patient saw PCP on 3/23 and Rx was sent to [facility] on 3/24 for levothyroxine 55mcg.   Advised patient to use most up to date Rx (23mcg) as he saw PCP who would have reviewed labs and made med adjustments as necessary

## 2014-07-21 NOTE — Telephone Encounter (Signed)
Larry Gates called in stating that Dr. Warren Lacy sent in a prescription for Levothyroxine 50 and 35mcg. She would like to clarify which strength he wants the pt to take. Please call  Thanks

## 2014-08-15 ENCOUNTER — Ambulatory Visit: Payer: Self-pay

## 2014-08-22 ENCOUNTER — Ambulatory Visit: Payer: Self-pay

## 2014-08-27 ENCOUNTER — Ambulatory Visit (INDEPENDENT_AMBULATORY_CARE_PROVIDER_SITE_OTHER): Payer: Medicare Other | Admitting: Cardiology

## 2014-08-27 ENCOUNTER — Encounter: Payer: Self-pay | Admitting: Cardiology

## 2014-08-27 VITALS — BP 142/70 | HR 81 | Ht 68.0 in | Wt 148.0 lb

## 2014-08-27 DIAGNOSIS — I10 Essential (primary) hypertension: Secondary | ICD-10-CM

## 2014-08-27 DIAGNOSIS — I483 Typical atrial flutter: Secondary | ICD-10-CM

## 2014-08-27 NOTE — Patient Instructions (Signed)
Your physician recommends that you continue on your current medications as directed. Please refer to the Current Medication list given to you today.  Follow up in 1 year with Dr. Percival Spanish.  You will receive a letter in the mail 2 months before you are due.  Please call us when you receive this letter to schedule your follow up appointment.  Thank you for choosing [facility]!!

## 2014-08-27 NOTE — Progress Notes (Signed)
HPI The patient has a history of atrial flutter.  He has had a mildly reduce EF but only minor CAD on cath in 2012.  His most recent EF was normal with some LVH. He has had cardioversion x 2.  He seems to have maintained NSR on amiodarone.    Since I last saw him he has done well.  The patient denies any new symptoms such as chest discomfort, neck or arm discomfort. There has been no new shortness of breath, PND or orthopnea. There have been no reported palpitations, presyncope or syncope.    Allergies  Allergen Reactions  . Penicillins     Current Outpatient Prescriptions  Medication Sig Dispense Refill  . amiodarone (PACERONE) 200 MG tablet Take 1 tablet (200 mg total) by mouth daily. 30 tablet 6  . diltiazem (CARDIZEM CD) 240 MG 24 hr capsule Pt takes Diltiazem ER CD 240 mg once daily. 30 capsule 6  . levothyroxine (SYNTHROID, LEVOTHROID) 75 MCG tablet Take 1 tablet (75 mcg total) by mouth daily before breakfast. 30 tablet 2  . warfarin (COUMADIN) 1 MG tablet TAKE ONE TO TWO TABLETS BY MOUTH ONCE DAILY AS  DIRECTED  BY  CLINIC 60 tablet 5  . warfarin (COUMADIN) 5 MG tablet Take 0.5-1 tablets (2.5-5 mg total) by mouth every morning. Takes one-half tablet for 5 days, then takes one whole tablet for 2 days. 90 tablet 3   No current facility-administered medications for this visit.    Past Medical History  Diagnosis Date  . Prostate cancer   . Hard of hearing   . Vertigo   . Atypical atrial flutter     a. Dx 07/2010 and coumadin initiated;  b. 08/2010 s/p DCCV.  [name] TIA (transient ischemic attack)   . Midsternal chest pain     a. In setting of A Flutter 07/2010;  b. Cath 07/2010: nonobs dzs  . Anticoagulated on Coumadin     a. Followed @ [facility]    Past Surgical History  Procedure Laterality Date  . Appendectomy    . Tonsillectomy    . Cardioversion  10/14/2011    Procedure: CARDIOVERSION;  Surgeon: Lelon Perla, MD;  Location: [place];  Service: Cardiovascular;  Laterality: N/A;    . Cardioversion N/A 07/24/2012    Procedure: CARDIOVERSION;  Surgeon: [name], MD;  Location: [place];  Service: Cardiovascular;  Laterality: N/A;  . I&d extremity Left 05/04/2013    Procedure: IRRIGATION AND DEBRIDEMENT of left hand with repair of lacerations and revision amputation of index finger;  Surgeon: Roseanne Kaufman, MD;  Location: [place];  Service: Orthopedics;  Laterality: Left;    ROS:  Hard of hearing.  Otherwise as stated in the HPI and negative for all other systems.  PHYSICAL EXAM BP 142/70 mmHg  Pulse 81  Ht 5\' 8"  (1.727 m)  Wt 148 lb (67.132 kg)  BMI 22.51 kg/m2 GENERAL:  Well appearing NECK:  No jugular venous distention, waveform within normal limits, carotid upstroke brisk and symmetric, no bruits, no thyromegaly LYMPHATICS:  No cervical, inguinal adenopathy LUNGS:  Clear to auscultation bilaterally CHEST:  Unremarkable HEART:  PMI not displaced or sustained,S1 and S2 within normal limits, no S3, no S4, no clicks, no rubs, no murmurs ABD:  Flat, positive bowel sounds normal in frequency in pitch, no bruits, no rebound, no guarding, no midline pulsatile mass, no hepatomegaly, no splenomegaly EXT:  2 plus pulses throughout, no edema, no cyanosis no clubbing   EKG:  NSR, rate 81, axis WNL, intervals WNL, poor anterior R wave progression, no acute ST T wave changes.    08/27/2014  ASSESSMENT AND PLAN  ATRIAL FLUTTER:  He seems to be maintaining NSR.   I will keep him on the meds as listed.  He has no desire to change to a NOAC. He is up-to-date with TSH and liver enzymes.   HTN:  The blood pressure is at target. No change in medications is indicated. We will continue with therapeutic lifestyle changes (TLC).   Lab Results  Component Value Date   TSH 6.252* 07/07/2014   ALT 10 07/09/2014   AST 13 07/09/2014   ALKPHOS 90 07/09/2014   BILITOT 0.7 07/09/2014   PROT 6.0 07/09/2014   ALBUMIN 3.4* 08/05/2013

## 2014-08-29 ENCOUNTER — Ambulatory Visit (INDEPENDENT_AMBULATORY_CARE_PROVIDER_SITE_OTHER): Payer: Medicare Other | Admitting: Pharmacist

## 2014-08-29 ENCOUNTER — Encounter: Payer: Self-pay | Admitting: Pharmacist

## 2014-08-29 VITALS — BP 162/68 | HR 76 | Ht 68.0 in | Wt 153.0 lb

## 2014-08-29 DIAGNOSIS — E032 Hypothyroidism due to medicaments and other exogenous substances: Secondary | ICD-10-CM

## 2014-08-29 DIAGNOSIS — I482 Chronic atrial fibrillation, unspecified: Secondary | ICD-10-CM

## 2014-08-29 DIAGNOSIS — I4891 Unspecified atrial fibrillation: Secondary | ICD-10-CM | POA: Diagnosis not present

## 2014-08-29 DIAGNOSIS — Z0000 Encounter for general adult medical examination without abnormal findings: Secondary | ICD-10-CM

## 2014-08-29 DIAGNOSIS — Z23 Encounter for immunization: Secondary | ICD-10-CM | POA: Diagnosis not present

## 2014-08-29 DIAGNOSIS — Z1211 Encounter for screening for malignant neoplasm of colon: Secondary | ICD-10-CM

## 2014-08-29 LAB — POCT INR: INR: 2.4

## 2014-08-29 NOTE — Addendum Note (Signed)
Addended by: Claretta Fraise on: 08/29/2014 07:39 PM   Modules accepted: Miquel Dunn

## 2014-08-29 NOTE — Progress Notes (Addendum)
[name], male   DOB: March 19, 1929, 81 y.o.   MRN: [id number]    Subjective:   Larry Gates is a 81 y.o. male who presents for an Initial Medicare Annual Wellness Visit and recheck protime  Patient is a widow and lives alone.  He has support from his son and niece who bring food and check on him regularly.  Current Medications (verified) Outpatient Encounter Prescriptions as of 08/29/2014  Medication Sig  . amiodarone (PACERONE) 200 MG tablet Take 1 tablet (200 mg total) by mouth daily.  [name] diltiazem (CARDIZEM CD) 240 MG 24 hr capsule Pt takes Diltiazem ER CD 240 mg once daily.  [name] levothyroxine (SYNTHROID, LEVOTHROID) 75 MCG tablet Take 1 tablet (75 mcg total) by mouth daily before breakfast.  . warfarin (COUMADIN) 1 MG tablet TAKE ONE TO TWO TABLETS BY MOUTH ONCE DAILY AS  DIRECTED  BY  CLINIC  . warfarin (COUMADIN) 5 MG tablet Take 0.5-1 tablets (2.5-5 mg total) by mouth every morning. Takes one-half tablet for 5 days, then takes one whole tablet for 2 days.   No facility-administered encounter medications on file as of 08/29/2014.    Allergies (verified) Penicillins   History: Past Medical History  Diagnosis Date  . Prostate cancer   . Hard of hearing   . Vertigo   . Atypical atrial flutter     a. Dx 07/2010 and coumadin initiated;  b. 08/2010 s/p DCCV.  [name] TIA (transient ischemic attack)   . Midsternal chest pain     a. In setting of A Flutter 07/2010;  b. Cath 07/2010: nonobs dzs  . Anticoagulated on Coumadin     a. Followed @ [facility]  . Cataract    Past Surgical History  Procedure Laterality Date  . Appendectomy    . Tonsillectomy    . Cardioversion  10/14/2011    Procedure: CARDIOVERSION;  Surgeon: Lelon Perla, MD;  Location: [place];  Service: Cardiovascular;  Laterality: N/A;  . Cardioversion N/A 07/24/2012    Procedure: CARDIOVERSION;  Surgeon: [name], MD;  Location: [place];  Service: Cardiovascular;  Laterality: N/A;  . I&d extremity Left  05/04/2013    Procedure: IRRIGATION AND DEBRIDEMENT of left hand with repair of lacerations and revision amputation of index finger;  Surgeon: Roseanne Kaufman, MD;  Location: [place];  Service: Orthopedics;  Laterality: Left;   Family History  Problem Relation Age of Onset  . CVA Father   . Alzheimer's disease Father   . Leukemia Father   . Alzheimer's disease Mother   . Heart Problems Brother     stents in his 35's-80's  . Early death Sister   . Early death Brother    Social History   Occupational History  . Not on file.   Social History Main Topics  . Smoking status: Never Smoker   . Smokeless tobacco: Never Used  . Alcohol Use: No  . Drug Use: No  . Sexual Activity: No   Do you feel safe at home?  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  Patient does not cook much himself.  Eats lots of vegetables from his garden.  Objective:      INR was 2.4 today   Today's Vitals   08/29/14 0855  BP:  162/68  Pulse: 76  Height: 5\' 8"  (1.727 m)  Weight: 153 lb (69.4 kg)  PainSc: 0-No pain   Body mass index is 23.27 kg/(m^2).  Activities of Daily Living In your present state of health, do you have any difficulty performing the following activities: 08/29/2014  Hearing? Y  Vision? Y  Difficulty concentrating or making decisions? N  Walking or climbing stairs? N  Dressing or bathing? N  Doing errands, shopping? N  Preparing Food and eating ? N  Using the Toilet? N  In the past six months, have you accidently leaked urine? N  Do you have problems with loss of bowel control? N  Managing your Medications? N  Managing your Finances? N  Housekeeping or managing your Housekeeping? N    Are there smokers in your home (other than you)? No   Cardiac Risk  Factors include: advanced age (>95men, >75 women);hypertension;male gender;sedentary lifestyle  Depression Screen PHQ 2/9 Scores 08/29/2014 07/09/2014  PHQ - 2 Score 0 0    Fall Risk Fall Risk  08/29/2014 07/09/2014  Falls in the past year? No No    Cognitive Function: MMSE - Mini Mental State Exam 08/29/2014  Orientation to time 5  Orientation to Place 5  Registration 3  Attention/ Calculation 3  Recall 3  Language- name 2 objects 2  Language- repeat 1  Language- follow 3 step command 3  Language- read & follow direction 1  Write a sentence 1  Copy design 1  Total score 28    Immunizations and Health Maintenance Immunization History  Administered Date(s) Administered  . Influenza,inj,Quad PF,36+ Mos 01/08/2013, 01/17/2014  . Tdap 05/04/2013   There are no preventive care reminders to display for this patient.  Patient Care Team: Claretta Fraise, MD as PCP - General (Family Medicine)  Indicate any recent Medical Services you may have received from other than [facility] providers in the past year (date may be approximate).    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 COLONOSCOPY  01/09/2015 (Originally 01/02/1981)  . ZOSTAVAX  01/09/2015 (Originally 01/03/1991)  . PNA vac Low Risk Adult (1 of 2 - PCV13) 01/09/2015 (Originally 01/03/1996)  . INFLUENZA VACCINE  11/17/2014  . TETANUS/TDAP  05/05/2023        Plan:   During the course of the visit Larry Gates was educated and counseled about the following appropriate screening and preventive services:   Vaccines to include Pneumoccal, Influenza, Hepatitis B, Td, Zostavax - Received Prevnar 11 in office today.  Cost for Zostavax today was $95 - patient declined  Colorectal cancer screening - Patient given FOBT today to take home and return to office.  Refuses colonoscopy  Cardiovascular disease screening - BP was  elevated today but was at goal when saw Dr Jenkins Rouge 08/27/2014.  No medication changes today but need to continue to monitor and make adjustments in meds as needed.  Discussed DASH diet. Lipids at goal.  Diabetes screening - UTD  Glaucoma screening / Diabetic Eye Exam - requested last visit notes from Dr Gus Puma office - they are faxing  Nutrition counseling - discussed Dash diet  Advanced Directives - UTD  Last TSH was elevated and checked 06/2014.  Levothyroxine dose was increased - due to have TSH rechecked today  Anticoagulation Dose Instructions as of 08/29/2014      [name] Tue Wed Thu Fri Sat   New  Dose 2.5 mg 1 mg 2.5 mg 2.5 mg 2.5 mg 1 mg 2.5 mg    Description        Continue 1/2 tablet of 5mg  daily except take 1 tablet of 1mg  warfarin on mondays and fridays       Orders Placed This Encounter  Procedures  . Fecal occult blood, imunochemical    Standing Status: Future     Number of Occurrences:      Standing Expiration Date: 10/16/2014  . Pneumococcal conjugate vaccine 13-valent IM  . Thyroid Panel With TSH      Patient Instructions (the written plan) were given to the patient.   Cherre Robins, PHARMD, CPP, CDE  08/29/2014         I have reviewed and agree with the above AWV documentation.  Claretta Fraise, M.D.

## 2014-08-29 NOTE — Patient Instructions (Addendum)
Anticoagulation Dose Instructions as of 08/29/2014      [name] Tue Wed Thu Fri Sat   New Dose 2.5 mg 1 mg 2.5 mg 2.5 mg 2.5 mg 1 mg 2.5 mg    Description        Continue 1/2 tablet of 5mg  daily except take 1 tablet of 1mg  warfarin on mondays and fridays      INR was 2.4 today   [name] , Thank you for taking time to come for your Medicare Wellness Visit. I appreciate your ongoing commitment to your health goals. Please review the following plan we discussed and let me know if I can assist you in the future.    This is a list of the screening recommended for you and due dates:  Health Maintenance  Topic Date Due  . Colon Cancer Screening - given Fecal Occult Blood Test in office today - please return Needed  . Shingles Vaccine  Needed  . Pneumonia vaccines  Completed  . Flu Shot  11/17/2014  . Tetanus Vaccine  05/05/2023  *Topic was postponed. The date shown is not the original due date.     DASH Eating Plan DASH stands for "Dietary Approaches to Stop Hypertension." The DASH eating plan is a healthy eating plan that has been shown to reduce high blood pressure (hypertension). Additional health benefits may include reducing the risk of type 2 diabetes mellitus, heart disease, and stroke. The DASH eating plan may also help with weight loss. WHAT DO I NEED TO KNOW ABOUT THE DASH EATING PLAN? For the DASH eating plan, you will follow these general guidelines:  Choose foods with a percent daily value for sodium of less than 5% (as listed on the food label).  Use salt-free seasonings or herbs instead of table salt or sea salt.  Check with your health care provider or pharmacist before using salt substitutes.  Eat lower-sodium products, often labeled as "lower sodium" or "no salt added."  Eat fresh foods.  Eat more vegetables, fruits, and low-fat dairy products.  Choose whole grains. Look for the word "whole" as the first word in the ingredient list.  Choose fish and  skinless chicken or [place] more often than red meat. Limit fish, poultry, and meat to 6 oz (170 g) each day.  Limit sweets, desserts, sugars, and sugary drinks.  Choose heart-healthy fats.  Limit cheese to 1 oz (28 g) per day.  Eat more home-cooked food and less restaurant, buffet, and fast food.  Limit fried foods.  Cook foods using methods other than frying.  Limit canned vegetables. If you do use them, rinse them well to decrease the sodium.  When eating at a restaurant, ask that your food be prepared with less salt, or no salt if possible. WHAT FOODS CAN I EAT? Seek help from a dietitian for individual calorie needs. Grains Whole grain or whole wheat bread. Brown rice. Whole grain or whole wheat pasta. Quinoa, bulgur, and whole grain cereals. Low-sodium cereals. Corn or whole wheat flour tortillas. Whole grain cornbread. Whole grain crackers. Low-sodium crackers. Vegetables Fresh or frozen vegetables (raw, steamed, roasted, or grilled). Low-sodium or reduced-sodium tomato and vegetable juices. Low-sodium or reduced-sodium tomato sauce and paste. Low-sodium or reduced-sodium canned vegetables.  Fruits All fresh, canned (in natural juice), or frozen fruits. Meat and Other Protein Products Ground beef (85% or leaner), grass-fed beef, or beef trimmed of fat. Skinless chicken or [place]. Ground chicken or [place]. Pork trimmed of fat. All fish and seafood.  Eggs. Dried beans, peas, or lentils. Unsalted nuts and seeds. Unsalted canned beans. Dairy Low-fat dairy products, such as skim or 1% milk, 2% or reduced-fat cheeses, low-fat ricotta or cottage cheese, or plain low-fat yogurt. Low-sodium or reduced-sodium cheeses. Fats and Oils Tub margarines without trans fats. Light or reduced-fat mayonnaise and salad dressings (reduced sodium). Avocado. Safflower, olive, or canola oils. Natural peanut or almond butter. Other Unsalted popcorn and pretzels. The items listed above may not be a  complete list of recommended foods or beverages. Contact your dietitian for more options. WHAT FOODS ARE NOT RECOMMENDED? Grains White bread. White pasta. White rice. Refined cornbread. Bagels and croissants. Crackers that contain trans fat. Vegetables Creamed or fried vegetables. Vegetables in a cheese sauce. Regular canned vegetables. Regular canned tomato sauce and paste. Regular tomato and vegetable juices. Fruits Dried fruits. Canned fruit in light or heavy syrup. Fruit juice. Meat and Other Protein Products Fatty cuts of meat. Ribs, chicken wings, bacon, sausage, bologna, salami, chitterlings, fatback, hot dogs, bratwurst, and packaged luncheon meats. Salted nuts and seeds. Canned beans with salt. Dairy Whole or 2% milk, cream, half-and-half, and cream cheese. Whole-fat or sweetened yogurt. Full-fat cheeses or blue cheese. Nondairy creamers and whipped toppings. Processed cheese, cheese spreads, or cheese curds. Condiments Onion and garlic salt, seasoned salt, table salt, and sea salt. Canned and packaged gravies. Worcestershire sauce. Tartar sauce. Barbecue sauce. Teriyaki sauce. Soy sauce, including reduced sodium. Steak sauce. Fish sauce. Oyster sauce. Cocktail sauce. Horseradish. Ketchup and mustard. Meat flavorings and tenderizers. Bouillon cubes. Hot sauce. Tabasco sauce. Marinades. Taco seasonings. Relishes. Fats and Oils Butter, stick margarine, lard, shortening, ghee, and bacon fat. Coconut, palm kernel, or palm oils. Regular salad dressings. Other Pickles and olives. Salted popcorn and pretzels. The items listed above may not be a complete list of foods and beverages to avoid. Contact your dietitian for more information. WHERE CAN I FIND MORE INFORMATION? National Heart, Lung, and Blood Institute: travelstabloid.com Document Released: 03/24/2011 Document Revised: 08/19/2013 Document Reviewed: 02/06/2013 [facility] Patient Information 2015  [place], [place]. This information is not intended to replace advice given to you by your health care provider. Make sure you discuss any questions you have with your health care provider.

## 2014-08-30 LAB — THYROID PANEL WITH TSH
Free Thyroxine Index: 2 (ref 1.2–4.9)
T3 Uptake Ratio: 32 % (ref 24–39)
T4, Total: 6.2 ug/dL (ref 4.5–12.0)
TSH: 4.99 u[IU]/mL — ABNORMAL HIGH (ref 0.450–4.500)

## 2014-08-31 ENCOUNTER — Other Ambulatory Visit: Payer: Self-pay | Admitting: Pharmacist

## 2014-08-31 MED ORDER — LEVOTHYROXINE SODIUM 88 MCG PO TABS: 88.0000 ug | ORAL_TABLET | Freq: Every day | ORAL | Status: DC

## 2014-09-03 ENCOUNTER — Other Ambulatory Visit: Payer: Medicare Other

## 2014-09-03 DIAGNOSIS — Z1212 Encounter for screening for malignant neoplasm of rectum: Secondary | ICD-10-CM

## 2014-09-06 LAB — FECAL OCCULT BLOOD, IMMUNOCHEMICAL: FECAL OCCULT BLD: NEGATIVE

## 2014-09-30 LAB — HM DIABETES EYE EXAM

## 2014-10-28 ENCOUNTER — Ambulatory Visit (INDEPENDENT_AMBULATORY_CARE_PROVIDER_SITE_OTHER): Payer: Medicare Other | Admitting: Pharmacist Clinician (PhC)/ Clinical Pharmacy Specialist

## 2014-10-28 DIAGNOSIS — I4821 Permanent atrial fibrillation: Secondary | ICD-10-CM

## 2014-10-28 DIAGNOSIS — I482 Chronic atrial fibrillation: Secondary | ICD-10-CM | POA: Diagnosis not present

## 2014-10-28 LAB — POCT INR: INR: 2.1

## 2014-11-18 ENCOUNTER — Other Ambulatory Visit: Payer: Self-pay | Admitting: Cardiology

## 2014-11-18 NOTE — Telephone Encounter (Signed)
REFILL 

## 2014-12-01 ENCOUNTER — Encounter: Payer: Self-pay | Admitting: *Deleted

## 2014-12-08 ENCOUNTER — Ambulatory Visit (INDEPENDENT_AMBULATORY_CARE_PROVIDER_SITE_OTHER): Payer: Medicare Other | Admitting: Family Medicine

## 2014-12-08 ENCOUNTER — Encounter: Payer: Self-pay | Admitting: Family Medicine

## 2014-12-08 VITALS — BP 181/83 | HR 83 | Temp 97.1°F | Ht 68.0 in | Wt 146.0 lb

## 2014-12-08 DIAGNOSIS — Z7901 Long term (current) use of anticoagulants: Secondary | ICD-10-CM

## 2014-12-08 DIAGNOSIS — E039 Hypothyroidism, unspecified: Secondary | ICD-10-CM | POA: Diagnosis not present

## 2014-12-08 DIAGNOSIS — I482 Chronic atrial fibrillation, unspecified: Secondary | ICD-10-CM

## 2014-12-08 DIAGNOSIS — I10 Essential (primary) hypertension: Secondary | ICD-10-CM | POA: Diagnosis not present

## 2014-12-08 LAB — POCT INR: INR: 2

## 2014-12-08 MED ORDER — DILTIAZEM HCL ER COATED BEADS 360 MG PO CP24: 360.0000 mg | ORAL_CAPSULE | Freq: Every day | ORAL | Status: DC

## 2014-12-08 NOTE — Progress Notes (Signed)
Subjective:  [name], male    DOB: Mar 31, 1929  Age: 81 y.o. MRN: [id number]  CC: Hypertension; Hypothyroidism; and Atrial Fibrillation   HPI Larry Gates presents for  follow-up of hypertension. Patient has no history of headache chest pain or shortness of breath or recent cough. Patient also denies symptoms of TIA such as numbness weakness lateralizing. Patient checks  blood pressure at home and has not had any elevated readings recently. Usually around 130/60. Patient denies side effects from his medication. States taking it regularly.  Patient presents for follow-up on  thyroid. She has a history of hypothyroidism for many years. It has been stable recently. Pt. denies any change in  voice, loss of hair, heat or cold intolerance. Energy level has been adequate to good. She denies constipation and diarrhea. No myxedema. Medication is as noted below. Verified that pt is taking it daily on an empty stomach. Well tolerated.   Patient in for follow-up of atrial fibrillation. Patient denies any recent bouts of chest pain or palpitations. Additionally, patient is taking anticoagulants. Patient denies any recent excessive bleeding episodes including epistaxis, bleeding from the gums, genitalia, rectal bleeding or hematuria. Additionally there has been no excessive bruising.   History Larry Gates has a past medical history of Prostate cancer; Hard of hearing; Vertigo; Atypical atrial flutter; TIA (transient ischemic attack); Midsternal chest pain; Anticoagulated on Coumadin; and Cataract.   He has past surgical history that includes Appendectomy; Tonsillectomy; Cardioversion (10/14/2011); Cardioversion (N/A, 07/24/2012); and I&D extremity (Left, 05/04/2013).   His family history includes Alzheimer's disease in his father and mother; CVA in his father; Early death in his brother and sister; Heart Problems in his brother; Leukemia in his father.He reports that he has never smoked. He has never  used smokeless tobacco. He reports that he does not drink alcohol or use illicit drugs.  Current Outpatient Prescriptions on File Prior to Visit  Medication Sig Dispense Refill  . amiodarone (PACERONE) 200 MG tablet TAKE ONE TABLET BY MOUTH ONCE DAILY 30 tablet 11  . levothyroxine (SYNTHROID, LEVOTHROID) 88 MCG tablet Take 1 tablet (88 mcg total) by mouth daily before breakfast. 90 tablet 1  . warfarin (COUMADIN) 1 MG tablet TAKE ONE TO TWO TABLETS BY MOUTH ONCE DAILY AS  DIRECTED  BY  CLINIC 60 tablet 5  . warfarin (COUMADIN) 5 MG tablet Take 0.5-1 tablets (2.5-5 mg total) by mouth every morning. Takes one-half tablet for 5 days, then takes one whole tablet for 2 days. 90 tablet 3   No current facility-administered medications on file prior to visit.    ROS Review of Systems  Constitutional: Negative for fever, chills and diaphoresis.  HENT: Negative for congestion, rhinorrhea and sore throat.   Respiratory: Negative for cough, shortness of breath and wheezing.   Cardiovascular: Negative for chest pain.  Gastrointestinal: Negative for nausea, vomiting, abdominal pain, diarrhea, constipation and abdominal distention.  Genitourinary: Negative for dysuria and frequency.  Musculoskeletal: Negative for joint swelling and arthralgias.  Skin: Negative for rash.  Neurological: Negative for headaches.    Objective:  BP 181/83 mmHg  Pulse 83  Temp(Src) 97.1 F (36.2 C) (Oral)  Ht 5\' 8"  (1.727 m)  Wt 146 lb (66.225 kg)  BMI 22.20 kg/m2  BP Readings from Last 3 Encounters:  12/08/14 181/83  08/29/14 162/68  08/27/14 142/70    Wt Readings from Last 3 Encounters:  12/08/14 146 lb (66.225 kg)  08/29/14 153 lb (69.4 kg)  08/27/14 148 lb (  67.132 kg)     Physical Exam  Constitutional: He is oriented to person, place, and time. He appears well-developed and well-nourished. No distress.  HENT:  Head: Normocephalic and atraumatic.  Right Ear: External ear normal.  Left Ear: External  ear normal.  Nose: Nose normal.  Mouth/Throat: Oropharynx is clear and moist.  Eyes: Conjunctivae and EOM are normal. Pupils are equal, round, and reactive to light.  Neck: Normal range of motion. Neck supple. No thyromegaly present.  Cardiovascular: Normal rate, regular rhythm and normal heart sounds.   No murmur heard. Pulmonary/Chest: Effort normal and breath sounds normal. No respiratory distress. He has no wheezes. He has no rales.  Abdominal: Soft. Bowel sounds are normal. There is no tenderness.  Musculoskeletal: Normal range of motion.  Lymphadenopathy:    He has no cervical adenopathy.  Neurological: He is alert and oriented to person, place, and time.  Skin: Skin is warm and dry.  Psychiatric:  Pt. Hostile, doesn't think he should be here. "Jesus came for the sick, not the well. Doctors should see the sick not the well and I'm not sick."     Lab Results  Component Value Date   HGBA1C * 08/06/2010    4.5 (NOTE)                                                                       According to the ADA Clinical Practice Recommendations for 2011, when HbA1c is used as a screening test:   >=6.5%   Diagnostic of Diabetes Mellitus           (if abnormal result  is confirmed)  5.7-6.4%   Increased risk of developing Diabetes Mellitus  References:Diagnosis and Classification of Diabetes Mellitus,Diabetes [id number] 1):S62-S69 and Standards of Medical Care in         Diabetes - 2011,Diabetes Care,2011,34  (Suppl 1):S11-S61.    Lab Results  Component Value Date   WBC 5.6 07/09/2014   HGB 15.5 07/09/2014   HCT 48.1 07/09/2014   PLT 164 04/23/2014   GLUCOSE 96 07/09/2014   CHOL 145 07/09/2014   TRIG 71 07/09/2014   HDL 55 07/09/2014   LDLCALC 76 07/09/2014   ALT 10 07/09/2014   AST 13 07/09/2014   NA 143 07/09/2014   K 4.4 07/09/2014   CL 106 07/09/2014   CREATININE 0.95 07/09/2014   BUN 17 07/09/2014   CO2 24 07/09/2014   TSH 2.790 12/08/2014   PSA 0.6 11/12/2013     INR 2.0 12/08/2014   HGBA1C * 08/06/2010    4.5 (NOTE)                                                                       According to the ADA Clinical Practice Recommendations for 2011, when HbA1c is used as a screening test:   >=6.5%   Diagnostic of Diabetes Mellitus           (if abnormal result  is confirmed)  5.7-6.4%  Increased risk of developing Diabetes Mellitus  References:Diagnosis and Classification of Diabetes Mellitus,Diabetes [id number] 1):S62-S69 and Standards of Medical Care in         Diabetes - 2011,Diabetes [id number]  (Suppl 1):S11-S61.    Dg Chest 2 View  08/05/2013   CLINICAL DATA:  No chest complaints, six-month checkup  EXAM: CHEST  2 VIEW  COMPARISON:  DG SHOULDER 2+V*R* dated 07/29/2013; DG CHEST 2 VIEW dated 05/04/2013  FINDINGS: There is a small right pleural effusion. There is no focal consolidation or pneumothorax. There is elevation of the left diaphragm. The heart and mediastinal contours are unremarkable.  There are multiple right posterior rib fractures again noted unchanged from the prior exam.  IMPRESSION: Small right pleural effusion and multiple right posterior rib fractures unchanged compared with the prior exam. No pneumothorax.   Electronically Signed   By: Kathreen Devoid   On: 08/05/2013 09:54    Assessment & Plan:   Riggins was seen today for hypertension, hypothyroidism and atrial fibrillation.  Diagnoses and all orders for this visit:  Accelerated hypertension  Atrial fibrillation, chronic -     POCT INR  Hypothyroidism, unspecified hypothyroidism type -     TSH -     T4, Free  Long term current use of anticoagulant therapy -     POCT INR  Other orders -     diltiazem (CARDIZEM CD) 360 MG 24 hr capsule; Take 1 capsule (360 mg total) by mouth daily.   I have discontinued Larry Gates's diltiazem. I am also having him start on diltiazem. Additionally, I am having him maintain his warfarin, warfarin, levothyroxine, and  amiodarone.  Meds ordered this encounter  Medications  . diltiazem (CARDIZEM CD) 360 MG 24 hr capsule    Sig: Take 1 capsule (360 mg total) by mouth daily.    Dispense:  30 capsule    Refill:  2     Follow-up: Pt. Said he would let us know if he needed anything.  Declines to folllow up. He will come in for his INR though. Patient also said he is taking more than enough medicine. He declined to take anything else for high blood pressure. It was unclear whether he was agreeable to taking the higher dose of diltiazem since it didn't involve more pills. He agrees to be seen by [facility] for his INR monthly.  Claretta Fraise, M.D.

## 2014-12-09 LAB — T4, FREE: Free T4: 1.51 ng/dL (ref 0.82–1.77)

## 2014-12-09 LAB — TSH: TSH: 2.79 u[IU]/mL (ref 0.450–4.500)

## 2014-12-23 ENCOUNTER — Encounter: Payer: Self-pay | Admitting: Pharmacist Clinician (PhC)/ Clinical Pharmacy Specialist

## 2015-01-01 ENCOUNTER — Other Ambulatory Visit: Payer: Self-pay | Admitting: Cardiology

## 2015-01-27 ENCOUNTER — Other Ambulatory Visit: Payer: Self-pay | Admitting: Pharmacist Clinician (PhC)/ Clinical Pharmacy Specialist

## 2015-02-03 ENCOUNTER — Encounter: Payer: Self-pay | Admitting: Pharmacist Clinician (PhC)/ Clinical Pharmacy Specialist

## 2015-02-05 ENCOUNTER — Ambulatory Visit (INDEPENDENT_AMBULATORY_CARE_PROVIDER_SITE_OTHER): Payer: Medicare Other

## 2015-02-05 DIAGNOSIS — Z23 Encounter for immunization: Secondary | ICD-10-CM

## 2015-02-19 ENCOUNTER — Telehealth: Payer: Self-pay | Admitting: Pharmacist

## 2015-02-19 NOTE — Telephone Encounter (Signed)
Called to scheduled appt - patient needs to have rechecked.  He had appt 01/2015 but missed.  Left message on VM about appt time and date

## 2015-03-03 ENCOUNTER — Ambulatory Visit (INDEPENDENT_AMBULATORY_CARE_PROVIDER_SITE_OTHER): Payer: Medicare Other | Admitting: Pharmacist Clinician (PhC)/ Clinical Pharmacy Specialist

## 2015-03-03 ENCOUNTER — Other Ambulatory Visit: Payer: Self-pay | Admitting: *Deleted

## 2015-03-03 DIAGNOSIS — I482 Chronic atrial fibrillation: Secondary | ICD-10-CM

## 2015-03-03 DIAGNOSIS — I4821 Permanent atrial fibrillation: Secondary | ICD-10-CM

## 2015-03-03 LAB — POCT INR: INR: 2.7

## 2015-03-03 MED ORDER — LEVOTHYROXINE SODIUM 88 MCG PO TABS: 88.0000 ug | ORAL_TABLET | Freq: Every day | ORAL | Status: DC

## 2015-03-03 MED ORDER — DILTIAZEM HCL ER COATED BEADS 240 MG PO CP24: ORAL_CAPSULE | ORAL | Status: DC

## 2015-03-03 NOTE — Progress Notes (Signed)
Pt in to see clinical pharmacist Refills needed for medications RXs sent in [place] per Dr Livia Snellen

## 2015-03-05 ENCOUNTER — Other Ambulatory Visit: Payer: Self-pay | Admitting: Family Medicine

## 2015-04-21 ENCOUNTER — Ambulatory Visit (INDEPENDENT_AMBULATORY_CARE_PROVIDER_SITE_OTHER): Payer: Medicare Other | Admitting: Pharmacist

## 2015-04-21 VITALS — BP 154/70 | HR 74 | Ht 67.0 in | Wt 146.5 lb

## 2015-04-21 DIAGNOSIS — I482 Chronic atrial fibrillation, unspecified: Secondary | ICD-10-CM

## 2015-04-21 DIAGNOSIS — Z7901 Long term (current) use of anticoagulants: Secondary | ICD-10-CM | POA: Diagnosis not present

## 2015-04-21 LAB — POCT INR: INR: 2.3

## 2015-04-21 NOTE — Patient Instructions (Signed)
Anticoagulation Dose Instructions as of 04/21/2015      Larry Gates Tue Wed Thu Fri Sat   New Dose 2.5 mg 1 mg 2.5 mg 2.5 mg 2.5 mg 1 mg 2.5 mg    Description        Continue 1/2 tablet of 5mg  daily except take 1 tablet of 1mg  warfarin on mondays and fridays     INR was 2.3 today

## 2015-04-21 NOTE — Progress Notes (Signed)
Subjective:     Indication: atrial fibrillation Bleeding signs/symptoms: None Thromboembolic signs/symptoms: None  Missed Coumadin doses: None Medication changes: no Dietary changes: no Bacterial/viral infection: no Other concerns: no  The following portions of the patient's history were reviewed and updated as appropriate: allergies, current medications, past medical history, past surgical history and problem list.   Objective:    INR Today: 2.3 Current dose: 1mg  on mondays and fridays;  2.5mg  all other days      Filed Vitals:   04/21/15 0805  BP: 154/70  Pulse: 74   Body mass index is 22.94 kg/(m^2).   Filed Weights   04/21/15 0805  Weight: 146 lb 8 oz (66.452 kg)     Assessment:    Therapeutic INR for goal of 2-3  HTN - BP is elevated but improved   Plan:    1. New dose: no change   2. Next INR: 6 weeks     3.  Appt made for patient for 6 month follow up with PCP 4.  Continue current medications for BP.  Cherre Robins, PharmD, CPP, CDE

## 2015-05-18 ENCOUNTER — Other Ambulatory Visit: Payer: Self-pay | Admitting: Cardiology

## 2015-05-18 NOTE — Telephone Encounter (Signed)
Rx request sent to pharmacy.  

## 2015-06-17 ENCOUNTER — Encounter: Payer: Self-pay | Admitting: Family Medicine

## 2015-06-17 ENCOUNTER — Ambulatory Visit (INDEPENDENT_AMBULATORY_CARE_PROVIDER_SITE_OTHER): Payer: Medicare Other | Admitting: Family Medicine

## 2015-06-17 VITALS — BP 189/80 | HR 88 | Temp 97.4°F | Ht 68.0 in | Wt 147.6 lb

## 2015-06-17 DIAGNOSIS — I10 Essential (primary) hypertension: Secondary | ICD-10-CM

## 2015-06-17 DIAGNOSIS — Z7901 Long term (current) use of anticoagulants: Secondary | ICD-10-CM

## 2015-06-17 DIAGNOSIS — E785 Hyperlipidemia, unspecified: Secondary | ICD-10-CM

## 2015-06-17 DIAGNOSIS — I482 Chronic atrial fibrillation, unspecified: Secondary | ICD-10-CM

## 2015-06-17 DIAGNOSIS — E039 Hypothyroidism, unspecified: Secondary | ICD-10-CM

## 2015-06-17 LAB — POCT INR: INR: 2.9

## 2015-06-17 NOTE — Progress Notes (Signed)
Subjective:  [name], male    DOB: 12-21-1928  Age: 82 y.o. MRN: [id number]  CC: Hypertension; Hypothyroidism; and Atrial Fibrillation   HPI Larry Gates presents for  follow-up of hypertension. Patient has no history of headache chest pain or shortness of breath or recent cough. Patient also denies symptoms of TIA such as numbness weakness lateralizing. Patient states his BP is always high in the morning and his cardiologist didn't tell him to increase his medication, therefore he is not interested at this time in bringing his BP down. He says it is better later in the day anyway. Patient denies side effects from medication. States taking it regularly.  Someone changed his thyroid dose without telling him and no blood was drawn so he decided to take the new med just 1/2 tablet daily. No symptoms of feeling sluggish, cold or constipated. Made the switch 2 weeks ago. He stated he is unwilling to change thedose until he runs out of these he just filed.  Patient in for follow-up of elevated cholesterol. Doing well without complaints but refuses medications. Currently no chest pain, shortness of breath or other cardiovascular related symptoms noted.   Patient in for follow-up of atrial fibrillation. Patient denies any recent bouts of chest pain or palpitations. Additionally, patient is taking anticoagulants. Patient denies any recent excessive bleeding episodes including epistaxis, bleeding from the gums, genitalia, rectal bleeding or hematuria. Additionally there has been no excessive bruising.     History Larry Gates has a past medical history of Prostate cancer ([place]); Hard of hearing; Vertigo; Atypical atrial flutter ([place]); TIA (transient ischemic attack); Midsternal chest pain; Anticoagulated on Coumadin; and Cataract.   He has past surgical history that includes Appendectomy; Tonsillectomy; Cardioversion (10/14/2011); Cardioversion (N/A, 07/24/2012); and I&D extremity (Left,  05/04/2013).   His family history includes Alzheimer's disease in his father and mother; CVA in his father; Early death in his brother and sister; Heart Problems in his brother; Leukemia in his father.He reports that he has never smoked. He has never used smokeless tobacco. He reports that he does not drink alcohol or use illicit drugs.  Current Outpatient Prescriptions on File Prior to Visit  Medication Sig Dispense Refill  . amiodarone (PACERONE) 200 MG tablet TAKE ONE TABLET BY MOUTH ONCE DAILY 30 tablet 11  . levothyroxine (SYNTHROID, LEVOTHROID) 88 MCG tablet Take 1 tablet (88 mcg total) by mouth daily before breakfast. 90 tablet 4  . warfarin (COUMADIN) 1 MG tablet TAKE ONE TO TWO TABLETS BY MOUTH ONCE DAILY OR AS DIRECTED BY CLINIC 60 tablet 2  . warfarin (COUMADIN) 5 MG tablet TAKE ONE-HALF TABLET BY MOUTH ONCE DAILY FOR 5 DAYS, AND THEN ONE ONCE DAILY FOR 2 DAYS.  REPEAT  WEEKLY 90 tablet 0  . CARTIA XT 240 MG 24 hr capsule TAKE ONE CAPSULE BY MOUTH ONCE DAILY (Patient not taking: Reported on 06/17/2015) 30 capsule 4   No current facility-administered medications on file prior to visit.    ROS Review of Systems  Constitutional: Negative for fever, chills, diaphoresis and unexpected weight change.  HENT: Negative for congestion, hearing loss, rhinorrhea and sore throat.   Eyes: Negative for visual disturbance.  Respiratory: Negative for cough and shortness of breath.   Cardiovascular: Negative for chest pain.  Gastrointestinal: Negative for abdominal pain, diarrhea and constipation.  Genitourinary: Negative for dysuria and flank pain.  Musculoskeletal: Negative for joint swelling and arthralgias.  Skin: Negative for rash.  Neurological: Negative for dizziness and headaches.  Psychiatric/Behavioral: Negative for sleep disturbance and dysphoric mood.    Objective:  BP 189/80 mmHg  Pulse 88  Temp(Src) 97.4 F (36.3 C) (Oral)  Ht 5' 8"  (1.727 m)  Wt 147 lb 9.6 oz (66.951 kg)   BMI 22.45 kg/m2  SpO2 98%  BP Readings from Last 3 Encounters:  06/17/15 189/80  04/21/15 154/70  12/08/14 181/83    Wt Readings from Last 3 Encounters:  06/17/15 147 lb 9.6 oz (66.951 kg)  04/21/15 146 lb 8 oz (66.452 kg)  12/08/14 146 lb (66.225 kg)     Physical Exam  Constitutional: He is oriented to person, place, and time. He appears well-developed and well-nourished. No distress.  HENT:  Head: Normocephalic and atraumatic.  Right Ear: External ear normal.  Left Ear: External ear normal.  Nose: Nose normal.  Mouth/Throat: Oropharynx is clear and moist.  Eyes: Conjunctivae and EOM are normal. Pupils are equal, round, and reactive to light.  Neck: Normal range of motion. Neck supple. No thyromegaly present.  Cardiovascular: Normal rate, regular rhythm and normal heart sounds.   No murmur heard. Pulmonary/Chest: Effort normal and breath sounds normal. No respiratory distress. He has no wheezes. He has no rales.  Abdominal: Soft. Bowel sounds are normal. He exhibits no distension. There is no tenderness.  Lymphadenopathy:    He has no cervical adenopathy.  Neurological: He is alert and oriented to person, place, and time. He has normal reflexes.  Skin: Skin is warm and dry.  Psychiatric: He has a normal mood and affect. His behavior is normal. Judgment and thought content normal.     Lab Results  Component Value Date   WBC 5.6 07/09/2014   HGB 15.5 07/09/2014   HCT 48.1 07/09/2014   PLT 164 04/23/2014   GLUCOSE 96 07/09/2014   CHOL 145 07/09/2014   TRIG 71 07/09/2014   HDL 55 07/09/2014   LDLCALC 76 07/09/2014   ALT 10 07/09/2014   AST 13 07/09/2014   NA 143 07/09/2014   K 4.4 07/09/2014   CL 106 07/09/2014   CREATININE 0.95 07/09/2014   BUN 17 07/09/2014   CO2 24 07/09/2014   TSH 2.790 12/08/2014   PSA 0.6 11/12/2013   INR 2.3 04/21/2015   HGBA1C * 08/06/2010    4.5 (NOTE)                                                                       According  to the ADA Clinical Practice Recommendations for 2011, when HbA1c is used as a screening test:   >=6.5%   Diagnostic of Diabetes Mellitus           (if abnormal result  is confirmed)  5.7-6.4%   Increased risk of developing Diabetes Mellitus  References:Diagnosis and Classification of Diabetes Mellitus,Diabetes IFOY,7741,28(NOMVE 1):S62-S69 and Standards of Medical Care in         Diabetes - 2011,Diabetes Care,2011,34  (Suppl 1):S11-S61.    Dg Chest 2 View  08/05/2013  CLINICAL DATA:  No chest complaints, six-month checkup EXAM: CHEST  2 VIEW COMPARISON:  DG SHOULDER 2+V*R* dated 07/29/2013; DG CHEST 2 VIEW dated 05/04/2013 FINDINGS: There is a small right pleural effusion. There is no focal consolidation or pneumothorax. There is elevation  of the left diaphragm. The heart and mediastinal contours are unremarkable. There are multiple right posterior rib fractures again noted unchanged from the prior exam. IMPRESSION: Small right pleural effusion and multiple right posterior rib fractures unchanged compared with the prior exam. No pneumothorax. Electronically Signed   By: Kathreen Devoid   On: 08/05/2013 09:54    Assessment & Plan:   Larry Gates was seen today for hypertension, hypothyroidism and atrial fibrillation.  Diagnoses and all orders for this visit:  Accelerated hypertension -     CBC with Differential/Platelet -     CMP14+EGFR  Atrial fibrillation, chronic (HCC) -     CBC with Differential/Platelet -     CMP14+EGFR -     POCT INR  Essential hypertension -     CBC with Differential/Platelet -     CMP14+EGFR  Hyperlipidemia -     CBC with Differential/Platelet -     CMP14+EGFR -     Lipid panel  Long term current use of anticoagulant therapy -     CBC with Differential/Platelet -     CMP14+EGFR  Hypothyroidism, unspecified hypothyroidism type -     CBC with Differential/Platelet -     CMP14+EGFR -     TSH + free T4  I am having Larry Gates maintain his amiodarone, warfarin,  levothyroxine, warfarin, and CARTIA XT.  No orders of the defined types were placed in this encounter.   Patient made it clear repeatedly that he does not intend to follow my recommendations. He is not taking the thyroid medicine as directed. He refuses to follow my lead regarding dangerously high blood pressure after repeated explanation regarding the risk to him for his heart. He is a sweet kind gentleman who unfortunately has determined that he does not want to take pills and he is not open to my suggestions.  Follow-up: Return if symptoms worsen or fail to improve. recommend that he be transferred to a different provider who might have a better impact on his health care area  Claretta Fraise, M.D.

## 2015-06-18 LAB — CMP14+EGFR
A/G RATIO: 1.7 (ref 1.1–2.5)
ALT: 11 IU/L (ref 0–44)
AST: 17 IU/L (ref 0–40)
Albumin: 3.9 g/dL (ref 3.5–4.7)
Alkaline Phosphatase: 104 IU/L (ref 39–117)
BILIRUBIN TOTAL: 0.6 mg/dL (ref 0.0–1.2)
BUN / CREAT RATIO: 18 (ref 10–22)
BUN: 16 mg/dL (ref 8–27)
CALCIUM: 9 mg/dL (ref 8.6–10.2)
CO2: 20 mmol/L (ref 18–29)
CREATININE: 0.91 mg/dL (ref 0.76–1.27)
Chloride: 104 mmol/L (ref 96–106)
GFR, EST AFRICAN AMERICAN: 89 mL/min/{1.73_m2} (ref >59)
GFR, EST NON AFRICAN AMERICAN: 77 mL/min/{1.73_m2} (ref >59)
GLOBULIN, TOTAL: 2.3 g/dL (ref 1.5–4.5)
Glucose: 172 mg/dL — ABNORMAL HIGH (ref 65–99)
Potassium: 4.1 mmol/L (ref 3.5–5.2)
Sodium: 140 mmol/L (ref 134–144)
TOTAL PROTEIN: 6.2 g/dL (ref 6.0–8.5)

## 2015-06-18 LAB — CBC WITH DIFFERENTIAL/PLATELET
BASOS: 1 %
Basophils Absolute: 0 10*3/uL (ref 0.0–0.2)
EOS (ABSOLUTE): 0.1 10*3/uL (ref 0.0–0.4)
EOS: 2 %
HEMATOCRIT: 42.7 % (ref 37.5–51.0)
Hemoglobin: 15.2 g/dL (ref 12.6–17.7)
Immature Grans (Abs): 0 10*3/uL (ref 0.0–0.1)
Immature Granulocytes: 0 %
Lymphocytes Absolute: 0.6 10*3/uL — ABNORMAL LOW (ref 0.7–3.1)
Lymphs: 13 %
MCH: 32.8 pg (ref 26.6–33.0)
MCHC: 35.6 g/dL (ref 31.5–35.7)
MCV: 92 fL (ref 79–97)
MONOS ABS: 0.4 10*3/uL (ref 0.1–0.9)
Monocytes: 8 %
NEUTROS ABS: 3.7 10*3/uL (ref 1.4–7.0)
Neutrophils: 76 %
PLATELETS: 157 10*3/uL (ref 150–379)
RBC: 4.64 x10E6/uL (ref 4.14–5.80)
RDW: 15.1 % (ref 12.3–15.4)
WBC: 4.9 10*3/uL (ref 3.4–10.8)

## 2015-06-18 LAB — LIPID PANEL
Chol/HDL Ratio: 3 ratio units (ref 0.0–5.0)
Cholesterol, Total: 137 mg/dL (ref 100–199)
HDL: 45 mg/dL (ref >39)
LDL CALC: 70 mg/dL (ref 0–99)
Triglycerides: 109 mg/dL (ref 0–149)
VLDL CHOLESTEROL CAL: 22 mg/dL (ref 5–40)

## 2015-06-18 LAB — TSH+FREE T4
FREE T4: 1.34 ng/dL (ref 0.82–1.77)
TSH: 4.79 u[IU]/mL — AB (ref 0.450–4.500)

## 2015-06-30 DIAGNOSIS — H2513 Age-related nuclear cataract, bilateral: Secondary | ICD-10-CM | POA: Diagnosis not present

## 2015-06-30 DIAGNOSIS — H40033 Anatomical narrow angle, bilateral: Secondary | ICD-10-CM | POA: Diagnosis not present

## 2015-07-03 DIAGNOSIS — H2513 Age-related nuclear cataract, bilateral: Secondary | ICD-10-CM | POA: Diagnosis not present

## 2015-07-09 DIAGNOSIS — H2511 Age-related nuclear cataract, right eye: Secondary | ICD-10-CM | POA: Diagnosis not present

## 2015-07-23 DIAGNOSIS — H2512 Age-related nuclear cataract, left eye: Secondary | ICD-10-CM | POA: Diagnosis not present

## 2015-08-11 ENCOUNTER — Ambulatory Visit (INDEPENDENT_AMBULATORY_CARE_PROVIDER_SITE_OTHER): Payer: Medicare Other | Admitting: Pharmacist Clinician (PhC)/ Clinical Pharmacy Specialist

## 2015-08-11 DIAGNOSIS — R739 Hyperglycemia, unspecified: Secondary | ICD-10-CM | POA: Diagnosis not present

## 2015-08-11 DIAGNOSIS — I482 Chronic atrial fibrillation, unspecified: Secondary | ICD-10-CM

## 2015-08-11 LAB — COAGUCHEK XS/INR WAIVED
INR: 3.4 — AB (ref 0.9–1.1)
Prothrombin Time: 40.5 s

## 2015-08-11 LAB — GLUCOSE HEMOCUE WAIVED: GLU HEMOCUE WAIVED: 125 mg/dL — AB (ref 65–99)

## 2015-08-11 LAB — BAYER DCA HB A1C WAIVED: HB A1C: 4.4 % (ref <7.0)

## 2015-08-11 NOTE — Patient Instructions (Signed)
Anticoagulation Dose Instructions as of 08/11/2015      Larry Gates Tue Wed Thu Fri Sat   New Dose 2.5 mg 1 mg 2.5 mg 2.5 mg 2.5 mg 1 mg 2.5 mg    Description        Do not take coumadin tomorrow (Wednesday), then go back to taking it the same way.  INR today is 3.4 a little thin

## 2015-09-01 ENCOUNTER — Ambulatory Visit (INDEPENDENT_AMBULATORY_CARE_PROVIDER_SITE_OTHER): Payer: Medicare Other | Admitting: Pharmacist Clinician (PhC)/ Clinical Pharmacy Specialist

## 2015-09-01 DIAGNOSIS — I482 Chronic atrial fibrillation, unspecified: Secondary | ICD-10-CM

## 2015-09-01 LAB — COAGUCHEK XS/INR WAIVED
INR: 2.7 — ABNORMAL HIGH (ref 0.9–1.1)
Prothrombin Time: 32.4 s

## 2015-09-01 NOTE — Patient Instructions (Signed)
Anticoagulation Dose Instructions as of 09/01/2015      [name] Tue Wed Thu Fri Sat   New Dose 2.5 mg 1 mg 2.5 mg 2.5 mg 2.5 mg 1 mg 2.5 mg    Description        Continue taking warfarin the same was as listed above.  INR 2.7

## 2015-09-16 ENCOUNTER — Ambulatory Visit: Payer: Medicare Other | Admitting: Cardiology

## 2015-10-03 ENCOUNTER — Ambulatory Visit (INDEPENDENT_AMBULATORY_CARE_PROVIDER_SITE_OTHER): Payer: Medicare Other | Admitting: Family Medicine

## 2015-10-03 ENCOUNTER — Encounter: Payer: Self-pay | Admitting: Family Medicine

## 2015-10-03 VITALS — BP 187/86 | HR 86 | Temp 98.4°F | Ht 68.0 in | Wt 145.0 lb

## 2015-10-03 DIAGNOSIS — S90922A Unspecified superficial injury of left foot, initial encounter: Secondary | ICD-10-CM

## 2015-10-03 DIAGNOSIS — S3092XA Unspecified superficial injury of abdominal wall, initial encounter: Secondary | ICD-10-CM | POA: Diagnosis not present

## 2015-10-03 DIAGNOSIS — J209 Acute bronchitis, unspecified: Secondary | ICD-10-CM | POA: Diagnosis not present

## 2015-10-03 DIAGNOSIS — W57XXXA Bitten or stung by nonvenomous insect and other nonvenomous arthropods, initial encounter: Secondary | ICD-10-CM

## 2015-10-03 MED ORDER — DOXYCYCLINE HYCLATE 100 MG PO TABS: 100.0000 mg | ORAL_TABLET | Freq: Two times a day (BID) | ORAL | Status: DC

## 2015-10-03 NOTE — Progress Notes (Signed)
BP 187/86 mmHg  Pulse 86  Temp(Src) 98.4 F (36.9 C) (Oral)  Ht 5\' 8"  (1.727 m)  Wt 145 lb (65.772 kg)  BMI 22.05 kg/m2   Subjective:    [name], male    DOB: March 17, 1929, 82 y.o.   MRN: [id number]  HPI: Larry Gates is a 82 y.o. male presenting on 10/03/2015 for Cough; Nasal Congestion; Fatigue; and tick bites   HPI Cough and nasal congestion and chest congestion Patient has been having cough and chest congestion and nasal congestion and productive cough with yellow-green sputum that is worse than his normal cough. This is been going on for the past 4 or 5 days. He denies any fevers or chills. He has had some shortness of breath but that's about at baseline that he usually is. He denies any wheezing that he knows of. He has had decreased energy and fatigue associated with it.  Tick bites Patient has had 2 different ticks pulled off himself over the past few weeks. One was on abdomen just below the umbilicus and one was on the dorsum of his left foot. He denies any rashes except for the small spot where the tick itself and into the skin. He denies any fevers or chills or aches but has had generalized fatigue.  Relevant past medical, surgical, family and social history reviewed and updated as indicated. Interim medical history since our last visit reviewed. Allergies and medications reviewed and updated.  Review of Systems  Constitutional: Negative for fever and chills.  HENT: Positive for congestion, postnasal drip, rhinorrhea, sinus pressure, sneezing and sore throat. Negative for ear discharge, ear pain and voice change.   Eyes: Negative for pain, discharge, redness and visual disturbance.  Respiratory: Positive for cough. Negative for chest tightness, shortness of breath and wheezing.   Cardiovascular: Negative for chest pain, palpitations and leg swelling.  Gastrointestinal: Negative for abdominal pain, diarrhea and constipation.  Genitourinary: Negative for  difficulty urinating.  Musculoskeletal: Negative for back pain and gait problem.  Skin: Positive for rash.  Neurological: Negative for syncope, light-headedness and headaches.  All other systems reviewed and are negative.   Per HPI unless specifically indicated above     Medication List       This list is accurate as of: 10/03/15  9:13 AM.  Always use your most recent med list.               amiodarone 200 MG tablet  Commonly known as:  PACERONE  TAKE ONE TABLET BY MOUTH ONCE DAILY     CARTIA XT 240 MG 24 hr capsule  Generic drug:  diltiazem  TAKE ONE CAPSULE BY MOUTH ONCE DAILY     doxycycline 100 MG tablet  Commonly known as:  VIBRA-TABS  Take 1 tablet (100 mg total) by mouth 2 (two) times daily. 1 po bid     levothyroxine 88 MCG tablet  Commonly known as:  SYNTHROID, LEVOTHROID  Take 1 tablet (88 mcg total) by mouth daily before breakfast.     warfarin 5 MG tablet  Commonly known as:  COUMADIN  TAKE ONE-HALF TABLET BY MOUTH ONCE DAILY FOR 5 DAYS, AND THEN ONE ONCE DAILY FOR 2 DAYS.  REPEAT  WEEKLY     warfarin 1 MG tablet  Commonly known as:  COUMADIN  TAKE ONE TO TWO TABLETS BY MOUTH ONCE DAILY OR AS DIRECTED BY CLINIC           Objective:  BP 187/86 mmHg  Pulse 86  Temp(Src) 98.4 F (36.9 C) (Oral)  Ht 5\' 8"  (1.727 m)  Wt 145 lb (65.772 kg)  BMI 22.05 kg/m2  Wt Readings from Last 3 Encounters:  10/03/15 145 lb (65.772 kg)  06/17/15 147 lb 9.6 oz (66.951 kg)  04/21/15 146 lb 8 oz (66.452 kg)    Physical Exam  Constitutional: He is oriented to person, place, and time. He appears well-developed and well-nourished. No distress.  HENT:  Right Ear: Tympanic membrane, external ear and ear canal normal.  Left Ear: Tympanic membrane, external ear and ear canal normal.  Nose: Mucosal edema and rhinorrhea present. No sinus tenderness. No epistaxis. Right sinus exhibits maxillary sinus tenderness. Right sinus exhibits no frontal sinus tenderness. Left  sinus exhibits maxillary sinus tenderness. Left sinus exhibits no frontal sinus tenderness.  Mouth/Throat: Uvula is midline and mucous membranes are normal. Posterior oropharyngeal edema and posterior oropharyngeal erythema present. No oropharyngeal exudate or tonsillar abscesses.  Eyes: Conjunctivae and EOM are normal. Pupils are equal, round, and reactive to light. Right eye exhibits no discharge. No scleral icterus.  Neck: Neck supple. No thyromegaly present.  Cardiovascular: Normal rate, regular rhythm, normal heart sounds and intact distal pulses.   No murmur heard. Pulmonary/Chest: Effort normal and breath sounds normal. No respiratory distress. He has no wheezes. He has no rales.  Musculoskeletal: Normal range of motion. He exhibits no edema.  Lymphadenopathy:    He has no cervical adenopathy.  Neurological: He is alert and oriented to person, place, and time. Coordination normal.  Skin: Skin is warm and dry. Rash (2 small eschars 1 on abdomen just below umbilicus and one on dorsum of left foot. No surrounding erythema or rash. No tenderness or fluctuation.) noted. He is not diaphoretic.  Psychiatric: He has a normal mood and affect. His behavior is normal.  Nursing note and vitals reviewed.      Assessment & Plan:   Problem List Items Addressed This Visit    None    Visit Diagnoses    Tick bite    -  Primary    Patient has 2 tick bites one on dorsum of left foot and 1 below his umbilicus    Acute bronchitis, unspecified organism            Follow up plan: Return if symptoms worsen or fail to improve, for Come back in one week for an INR recheck.  Counseling provided for all of the vaccine components No orders of the defined types were placed in this encounter.    Caryl Pina, MD [facility] 10/03/2015, 9:13 AM

## 2015-10-09 ENCOUNTER — Other Ambulatory Visit: Payer: Self-pay | Admitting: Family Medicine

## 2015-10-12 ENCOUNTER — Ambulatory Visit (INDEPENDENT_AMBULATORY_CARE_PROVIDER_SITE_OTHER): Payer: Medicare Other | Admitting: Pharmacist

## 2015-10-12 ENCOUNTER — Encounter: Payer: Self-pay | Admitting: Pharmacist

## 2015-10-12 VITALS — BP 182/80 | HR 78 | Ht 67.5 in | Wt 148.5 lb

## 2015-10-12 DIAGNOSIS — R29890 Loss of height: Secondary | ICD-10-CM

## 2015-10-12 DIAGNOSIS — I482 Chronic atrial fibrillation, unspecified: Secondary | ICD-10-CM

## 2015-10-12 DIAGNOSIS — Z1211 Encounter for screening for malignant neoplasm of colon: Secondary | ICD-10-CM

## 2015-10-12 DIAGNOSIS — Z0000 Encounter for general adult medical examination without abnormal findings: Secondary | ICD-10-CM | POA: Diagnosis not present

## 2015-10-12 LAB — COAGUCHEK XS/INR WAIVED
INR: 4 — ABNORMAL HIGH (ref 0.9–1.1)
PROTHROMBIN TIME: 48.2 s

## 2015-10-12 MED ORDER — WARFARIN SODIUM 2 MG PO TABS: 1.0000 mg | ORAL_TABLET | Freq: Every day | ORAL | Status: DC

## 2015-10-12 NOTE — Progress Notes (Signed)
[name], male   DOB: 04/17/1929, 82 y.o.   MRN: [id number]    Subjective:   Larry Gates is a 82 y.o. male who presents for a subsequent Medicare Annual Wellness Visit.  Larry Gates is widowed and lives alone.  He has support from his daughter and grandson.  He has no medical complaints or concerns today.  Review of Systems  Review of Systems  Constitutional: Negative.   HENT: Positive for hearing loss (wears hearing aid).   Eyes: Negative.   Respiratory: Negative.   Cardiovascular: Negative.   Gastrointestinal: Negative.   Genitourinary: Negative.   Musculoskeletal: Negative.   Skin: Negative.   Neurological: Negative.   Endo/Heme/Allergies: Negative.   Psychiatric/Behavioral: Negative.        Current Medications (verified) Outpatient Encounter Prescriptions as of 10/12/2015  Medication Sig  . amiodarone (PACERONE) 200 MG tablet TAKE ONE TABLET BY MOUTH ONCE DAILY  . doxycycline (VIBRA-TABS) 100 MG tablet Take 1 tablet (100 mg total) by mouth 2 (two) times daily. 1 po bid  . levothyroxine (SYNTHROID, LEVOTHROID) 88 MCG tablet Take 1 tablet (88 mcg total) by mouth daily before breakfast.  . [DISCONTINUED] warfarin (COUMADIN) 1 MG tablet TAKE ONE TO TWO TABLETS BY MOUTH ONCE DAILY OR AS DIRECTED BY CLINIC  . [DISCONTINUED] warfarin (COUMADIN) 5 MG tablet TAKE ONE-HALF TABLET BY MOUTH ONCE DAILY FOR 5 DAYS, AND THEN ONE ONCE DAILY FOR 2 DAYS.  REPEAT  WEEKLY  . CARTIA XT 240 MG 24 hr capsule TAKE ONE CAPSULE BY MOUTH ONCE DAILY (Patient not taking: Reported on 10/12/2015)  . warfarin (COUMADIN) 2 MG tablet Take 0.5-1 tablets (1-2 mg total) by mouth daily.   No facility-administered encounter medications on file as of 10/12/2015.    Allergies (verified) Penicillins   History: Past Medical History  Diagnosis Date  . Prostate cancer ([place])   . Hard of hearing   . Vertigo   . Atypical atrial flutter ([place])     a. Dx 07/2010 and coumadin initiated;  b. 08/2010  s/p DCCV.  [name] TIA (transient ischemic attack)   . Midsternal chest pain     a. In setting of A Flutter 07/2010;  b. Cath 07/2010: nonobs dzs  . Anticoagulated on Coumadin     a. Followed @ [facility]  . Cataract    Past Surgical History  Procedure Laterality Date  . Appendectomy    . Tonsillectomy    . Cardioversion  10/14/2011    Procedure: CARDIOVERSION;  Surgeon: Lelon Perla, MD;  Location: [place];  Service: Cardiovascular;  Laterality: N/A;  . Cardioversion N/A 07/24/2012    Procedure: CARDIOVERSION;  Surgeon: [name], MD;  Location: [place];  Service: Cardiovascular;  Laterality: N/A;  . I&d extremity Left 05/04/2013    Procedure: IRRIGATION AND DEBRIDEMENT of left hand with repair of lacerations and revision amputation of index finger;  Surgeon: Roseanne Kaufman, MD;  Location: [facility];  Service: Orthopedics;  Laterality: Left;  [name] Eye surgery Bilateral     cataract   Family History  Problem Relation Age of Onset  . CVA Father   . Alzheimer's disease Father   . Leukemia Father   . Alzheimer's disease Mother   . Heart Problems Brother     stents in his 27's-80's  . Early death Sister   . Early death Brother    Social History   Occupational History  . Not on file.   Social History Main Topics  . Smoking  status: Never Smoker   . Smokeless tobacco: Never Used  . Alcohol Use: No  . Drug Use: No  . Sexual Activity: No    Do you feel safe at home?  Yes Are there smokers in your home (other than you)? No  Dietary issues and exercise activities discussed: Current Exercise Habits: The patient does not participate in regular exercise at present  Current Dietary habits:  Patient receives meals on wheels.  He also has family and friends who help him with meals and groceries.  He does not add salt to foods  Cardiac Risk Factors include: advanced age (>2men, >42 women);hypertension;male gender;sedentary lifestyle  Objective:    Today's Vitals   10/12/15 0838  BP:  182/80  Pulse: 78  Height: 5' 7.5" (1.715 m)  Weight: 148 lb 8 oz (67.359 kg)  PainSc: 0-No pain   Body mass index is 22.9 kg/(m^2).   INR was 4.0 today (has been taking doxycycline for about 2 weeks)   Activities of Daily Living In your present state of health, do you have any difficulty performing the following activities: 10/12/2015  Hearing? Y  Vision? N  Difficulty concentrating or making decisions? N  Walking or climbing stairs? N  Dressing or bathing? N  Preparing Food and eating ? (No Data)  Using the Toilet? N  In the past six months, have you accidently leaked urine? N  Do you have problems with loss of bowel control? N  Managing your Medications? N  Managing your Finances? N  Housekeeping or managing your Housekeeping? N     Depression Screen PHQ 2/9 Scores 10/12/2015 10/03/2015 06/17/2015 12/08/2014  PHQ - 2 Score 0 0 0 0     Fall Risk Fall Risk  10/12/2015 10/03/2015 06/17/2015 12/08/2014 08/29/2014  Falls in the past year? No No No No No    Cognitive Function: MMSE - Mini Mental State Exam 10/12/2015 08/29/2014  Orientation to time 5 5  Orientation to Place 5 5  Registration 3 3  Attention/ Calculation 4 3  Recall 3 3  Language- name 2 objects 2 2  Language- repeat 1 1  Language- follow 3 step command 3 3  Language- read & follow direction 1 1  Write a sentence 1 1  Copy design 1 1  Total score 29 28    Immunizations and Health Maintenance Immunization History  Administered Date(s) Administered  . Influenza, High Dose Seasonal PF 02/05/2015  . Influenza,inj,Quad PF,36+ Mos 01/08/2013, 01/17/2014  . Pneumococcal Conjugate-13 08/29/2014  . Tdap 05/04/2013   Health Maintenance Due  Topic Date Due  . ZOSTAVAX  01/03/1991  . PNA vac Low Risk Adult (2 of 2 - PPSV23) 08/29/2015    Patient Care Team: Claretta Fraise, MD as PCP - General (Family Medicine) Adelina Mings Margret Chance, MD as Referring Physician (Optometry) Minus Breeding, MD as Consulting Physician  (Cardiology)  Indicate any recent Medical Services you may have received from other than [facility] providers in the past year (date may be approximate).    Assessment:    Annual Wellness Visit  Supratherapeutic anticoagulation HTN - patient has not been taking diltiazem 240mg  for the last 4 months.  He states that dose was changed in November to 360mg  but he was not told about this so he took 240mg  until Rx was used up and has not taken any since around Jan 2017.   Screening Tests Health Maintenance  Topic Date Due  . ZOSTAVAX  01/03/1991  . PNA vac Low Risk Adult (  2 of 2 - PPSV23) 08/29/2015  . INFLUENZA VACCINE  11/17/2015  . TETANUS/TDAP  05/05/2023        Plan:   During the course of the visit [place] was educated and counseled about the following appropriate screening and preventive services:   Vaccines to include Pneumoccal, Influenza,  Td, Zostavax - checking on Pneumo 23 vaccine - was probably given before EMR.  Declined Zostavax vaccines due to cost (over $200 today)  Colorectal cancer screening - FOBT given to take home and return  Cardiovascular disease screening - Seeing Dr Jenkins Rouge in 2 days  Advised he restart diltiazem 240mg  1 capsules daily  Diabetes screening - UTD  Bone Denisty / Osteoporosis Screening - since patient has lost over 2 inches of height will order DEXA  Glaucoma screening / Eye Exam - UTD   Nutrition counseling - Continue with meals on Wheels and limiting salt intake  Advanced Directives - UTD  Physical Activity - Discussed chair exercises and handout given - will help keep core and leg muscles strong and prevent falls.   Advised patient to RTC in 2 weeks to check INR but he refuse to RTC so soon.  He agreed to 1 month.   Anticoagulation Dose Instructions as of 10/12/2015      [name] Tue Wed Thu Fri Sat   New Dose 2 mg 1 mg 2 mg 2 mg 2 mg 2 mg 2 mg    Description        Do no take any warfarin tomorrow or Wednesday -  Tuesday, June 27th and  Wednesday, June 28th. Then change to warfarin 2mg  tablets - take 1 tablet daily except on Mondays take 1/2 tablet       Patient Instructions (the written plan) were given to the patient.   Cherre Robins, [facility]   10/12/2015

## 2015-10-12 NOTE — Patient Instructions (Addendum)
  Anticoagulation Dose Instructions as of 10/12/2015      [name] Tue Wed Thu Fri Sat   New Dose 2 mg 1 mg 2 mg 2 mg 2 mg 2 mg 2 mg    Description        Do no take any warfarin tomorrow or Wednesday -  Tuesday, June 27th and Wednesday, June 28th. Then change to warfarin 2mg  tablets - take 1 tablet daily except on Mondays take 1/2 tablet     INR was 4.0 today   Larry Gates , Thank you for taking time to come for your Medicare Wellness Visit. I appreciate your ongoing commitment to your health goals. Please review the following plan we discussed and let me know if I can assist you in the future.   These are the goals we discussed: Restart diltiazem 240mg  - take 1 capsule dialy  Try to stay active - recommend either walking daily or doing chair exercises.     This is a list of the screening recommended for you and due dates:  Health Maintenance  Topic Date Due  . Shingles Vaccine  01/03/1991  . Pneumonia vaccines (2 of 2 - PPSV23) 08/29/2015  . Flu Shot  11/17/2015  . Tetanus Vaccine  05/05/2023

## 2015-10-13 NOTE — Progress Notes (Signed)
HPI The patient has a history of atrial flutter.  He has had a mildly reduce EF but only minor CAD on cath in 2012.  His most recent EF was normal with some LVH. He has had cardioversion x 2.  He seems to have maintained NSR on amiodarone.    Since I last saw him he continues to work.  He does his farm work.The patient denies any new symptoms such as chest discomfort, neck or arm discomfort. There has been no new shortness of breath, PND or orthopnea. There have been no reported palpitations, presyncope or syncope.  He said that recently when he went to get his medications refilled they were going to increase his Cardizem and he didn't know anything about this. He was anxious about that so he didn't take it. He comes back to see me today in follow-up.  Allergies  Allergen Reactions  . Penicillins Rash    Current Outpatient Prescriptions  Medication Sig Dispense Refill  . amiodarone (PACERONE) 200 MG tablet TAKE ONE TABLET BY MOUTH ONCE DAILY 30 tablet 11  . levothyroxine (SYNTHROID, LEVOTHROID) 88 MCG tablet Take 1 tablet (88 mcg total) by mouth daily before breakfast. 90 tablet 4  . warfarin (COUMADIN) 2 MG tablet Take 0.5-1 tablets (1-2 mg total) by mouth daily. 90 tablet 1  . diltiazem (CARDIZEM CD) 120 MG 24 hr capsule Take 1 capsule (120 mg total) by mouth daily. 90 capsule 3   No current facility-administered medications for this visit.    Past Medical History  Diagnosis Date  . Prostate cancer ([place])   . Hard of hearing   . Vertigo   . Atypical atrial flutter ([place])     a. Dx 07/2010 and coumadin initiated;  b. 08/2010 s/p DCCV.  [name] TIA (transient ischemic attack)   . Midsternal chest pain     a. In setting of A Flutter 07/2010;  b. Cath 07/2010: nonobs dzs  . Anticoagulated on Coumadin     a. Followed @ [facility]  . Cataract     Past Surgical History  Procedure Laterality Date  . Appendectomy    . Tonsillectomy    . Cardioversion  10/14/2011    Procedure: CARDIOVERSION;   Surgeon: Lelon Perla, MD;  Location: [facility];  Service: Cardiovascular;  Laterality: N/A;  . Cardioversion N/A 07/24/2012    Procedure: CARDIOVERSION;  Surgeon: [name], MD;  Location: [facility];  Service: Cardiovascular;  Laterality: N/A;  . I&d extremity Left 05/04/2013    Procedure: IRRIGATION AND DEBRIDEMENT of left hand with repair of lacerations and revision amputation of index finger;  Surgeon: Roseanne Kaufman, MD;  Location: [facility];  Service: Orthopedics;  Laterality: Left;  [name] Eye surgery Bilateral     cataract    ROS:  Hard of hearing.  Otherwise as stated in the HPI and negative for all other systems.  PHYSICAL EXAM BP 160/70 mmHg  Pulse 80  Ht 5\' 7"  (1.702 m)  Wt 145 lb (65.772 kg)  BMI 22.71 kg/m2 GENERAL:  Well appearing NECK:  No jugular venous distention, waveform within normal limits, carotid upstroke brisk and symmetric, no bruits, no thyromegaly LYMPHATICS:  No cervical, inguinal adenopathy LUNGS:  Clear to auscultation bilaterally CHEST:  Unremarkable HEART:  PMI not displaced or sustained,S1 and S2 within normal limits, no S3, no S4, no clicks, no rubs, no murmurs ABD:  Flat, positive bowel sounds normal in frequency in pitch, no bruits, no rebound, no guarding, no midline pulsatile mass,  no hepatomegaly, no splenomegaly EXT:  2 plus pulses throughout, no edema, no cyanosis no clubbing SKIN:  Excessive sun exposure   EKG:  NSR, rate 82, axis WNL, QT prolonged, poor anterior R wave progression, no acute ST T wave changes.    10/14/2015  ASSESSMENT AND PLAN  ATRIAL FLUTTER:  He seems to be maintaining NSR.   I will keep him on the meds as listed.  He has no desire to change to a NOAC. He is up-to-date with TSH and liver enzymes.   Mr. DEMONDRE BOSTIC has a CHA2DS2 - VASc score of 3 with a risk of stroke of 3.2%.  I cautioned him about the loose skin that can occur with amiodarone sun. He is up-to-date with labs as below. The QT is prolonged but stable.  HTN:   The blood pressure is elevated but has been off his Cardizem. I will restart this at 120 mg.   Lab Results  Component Value Date   TSH 4.790* 06/17/2015   ALT 11 06/17/2015   AST 17 06/17/2015   ALKPHOS 104 06/17/2015   BILITOT 0.6 06/17/2015   PROT 6.2 06/17/2015   ALBUMIN 3.9 06/17/2015

## 2015-10-14 ENCOUNTER — Ambulatory Visit (INDEPENDENT_AMBULATORY_CARE_PROVIDER_SITE_OTHER): Payer: Medicare Other | Admitting: Cardiology

## 2015-10-14 ENCOUNTER — Encounter: Payer: Self-pay | Admitting: Cardiology

## 2015-10-14 VITALS — BP 160/70 | HR 80 | Ht 67.0 in | Wt 145.0 lb

## 2015-10-14 DIAGNOSIS — I10 Essential (primary) hypertension: Secondary | ICD-10-CM

## 2015-10-14 DIAGNOSIS — I480 Paroxysmal atrial fibrillation: Secondary | ICD-10-CM | POA: Diagnosis not present

## 2015-10-14 MED ORDER — DILTIAZEM HCL ER COATED BEADS 120 MG PO CP24: 120.0000 mg | ORAL_CAPSULE | Freq: Every day | ORAL | Status: DC

## 2015-10-14 NOTE — Patient Instructions (Signed)
Medication Instructions:  Please start Cardizem 120 mg a day. Continue all other medications as listed.  Follow-Up: Follow up in 1 year with Dr. Percival Spanish in [place].  You will receive a letter in the mail 2 months before you are due.  Please call us when you receive this letter to schedule your follow up appointment.  If you need a refill on your cardiac medications before your next appointment, please call your pharmacy.  Thank you for choosing [facility]!!

## 2015-10-22 ENCOUNTER — Other Ambulatory Visit: Payer: Medicare Other

## 2015-10-22 DIAGNOSIS — Z1211 Encounter for screening for malignant neoplasm of colon: Secondary | ICD-10-CM

## 2015-10-25 LAB — FECAL OCCULT BLOOD, IMMUNOCHEMICAL: Fecal Occult Bld: NEGATIVE

## 2015-11-10 ENCOUNTER — Ambulatory Visit (INDEPENDENT_AMBULATORY_CARE_PROVIDER_SITE_OTHER): Payer: Medicare Other | Admitting: Pharmacist

## 2015-11-10 DIAGNOSIS — I482 Chronic atrial fibrillation, unspecified: Secondary | ICD-10-CM

## 2015-11-10 LAB — COAGUCHEK XS/INR WAIVED
INR: 2.3 — AB (ref 0.9–1.1)
PROTHROMBIN TIME: 27.3 s

## 2015-11-10 NOTE — Progress Notes (Signed)
Subjective:     Indication: atrial fibrillation Bleeding signs/symptoms: None Thromboembolic signs/symptoms: None  Missed Coumadin doses: None Medication changes: no Dietary changes: no Bacterial/viral infection: no Other concerns: no   Objective:    INR Today: 2.3 Current dose: warfarin 2mg  - take 1/2 tablet on mondays and 1 tablet all other days.    Assessment:    Therapeutic INR for goal of 2-3   Plan:    1. New dose: no change   2. Next INR: 6 weeks - I recommended recheck 12/11/15 but patient refused any earlier than 6 weeks.  He wanted to go 2 to 3 months.

## 2015-12-01 ENCOUNTER — Telehealth: Payer: Self-pay

## 2015-12-01 MED ORDER — AMIODARONE HCL 200 MG PO TABS: 200.0000 mg | ORAL_TABLET | Freq: Every day | ORAL | 11 refills | Status: DC

## 2015-12-01 NOTE — Telephone Encounter (Signed)
Reiceved call from pharmacy wanting refill on Pacerone  For patient rx sent over electronically for patient.

## 2015-12-25 ENCOUNTER — Ambulatory Visit (INDEPENDENT_AMBULATORY_CARE_PROVIDER_SITE_OTHER): Payer: Medicare Other | Admitting: Pharmacist Clinician (PhC)/ Clinical Pharmacy Specialist

## 2015-12-25 DIAGNOSIS — I482 Chronic atrial fibrillation, unspecified: Secondary | ICD-10-CM

## 2015-12-25 LAB — COAGUCHEK XS/INR WAIVED
INR: 2.6 — ABNORMAL HIGH (ref 0.9–1.1)
PROTHROMBIN TIME: 31.1 s

## 2015-12-25 NOTE — Patient Instructions (Signed)
Anticoagulation Dose Instructions as of 12/25/2015      Larry Gates Tue Wed Thu Fri Sat   New Dose 2 mg 1 mg 2 mg 2 mg 2 mg 2 mg 2 mg    Description   Take 2mg  every day of the week except for Mondays take 1mg   INR today is 2.6

## 2015-12-30 DIAGNOSIS — D1801 Hemangioma of skin and subcutaneous tissue: Secondary | ICD-10-CM | POA: Diagnosis not present

## 2015-12-30 DIAGNOSIS — L821 Other seborrheic keratosis: Secondary | ICD-10-CM | POA: Diagnosis not present

## 2015-12-30 DIAGNOSIS — L570 Actinic keratosis: Secondary | ICD-10-CM | POA: Diagnosis not present

## 2015-12-30 DIAGNOSIS — L814 Other melanin hyperpigmentation: Secondary | ICD-10-CM | POA: Diagnosis not present

## 2015-12-30 DIAGNOSIS — D225 Melanocytic nevi of trunk: Secondary | ICD-10-CM | POA: Diagnosis not present

## 2015-12-30 DIAGNOSIS — Z85828 Personal history of other malignant neoplasm of skin: Secondary | ICD-10-CM | POA: Diagnosis not present

## 2016-01-29 ENCOUNTER — Other Ambulatory Visit: Payer: Self-pay | Admitting: Pharmacist

## 2016-02-19 ENCOUNTER — Ambulatory Visit (INDEPENDENT_AMBULATORY_CARE_PROVIDER_SITE_OTHER): Payer: Medicare Other | Admitting: Pharmacist Clinician (PhC)/ Clinical Pharmacy Specialist

## 2016-02-19 DIAGNOSIS — Z23 Encounter for immunization: Secondary | ICD-10-CM | POA: Diagnosis not present

## 2016-02-19 DIAGNOSIS — I482 Chronic atrial fibrillation, unspecified: Secondary | ICD-10-CM

## 2016-02-19 LAB — COAGUCHEK XS/INR WAIVED
INR: 2 — AB (ref 0.9–1.1)
Prothrombin Time: 24.5 s

## 2016-02-19 NOTE — Patient Instructions (Signed)
Anticoagulation Dose Instructions as of 02/19/2016      [name] Tue Wed Thu Fri Sat   New Dose 2 mg 1 mg 2 mg 2 mg 2 mg 2 mg 2 mg    Description   Take 2mg  every day of the week except for Mondays take 1mg   INR today is 2.0

## 2016-04-01 ENCOUNTER — Ambulatory Visit (INDEPENDENT_AMBULATORY_CARE_PROVIDER_SITE_OTHER): Payer: Medicare Other | Admitting: Pharmacist Clinician (PhC)/ Clinical Pharmacy Specialist

## 2016-04-01 DIAGNOSIS — R739 Hyperglycemia, unspecified: Secondary | ICD-10-CM | POA: Diagnosis not present

## 2016-04-01 DIAGNOSIS — I482 Chronic atrial fibrillation, unspecified: Secondary | ICD-10-CM

## 2016-04-01 LAB — COAGUCHEK XS/INR WAIVED
INR: 2.1 — AB (ref 0.9–1.1)
PROTHROMBIN TIME: 24.9 s

## 2016-04-01 LAB — BAYER DCA HB A1C WAIVED: HB A1C: 4.3 % (ref <7.0)

## 2016-04-01 LAB — GLUCOSE HEMOCUE WAIVED: Glu Hemocue Waived: 125 mg/dL — ABNORMAL HIGH (ref 65–99)

## 2016-04-01 NOTE — Patient Instructions (Signed)
Anticoagulation Dose Instructions as of 04/01/2016      [name] Tue Wed Thu Fri Sat   New Dose 2 mg 1 mg 2 mg 2 mg 2 mg 2 mg 2 mg    Description   Take 2mg  every day of the week except for Mondays take 1mg   INR today is 2.1

## 2016-06-03 ENCOUNTER — Ambulatory Visit (INDEPENDENT_AMBULATORY_CARE_PROVIDER_SITE_OTHER): Payer: Medicare Other | Admitting: Pharmacist

## 2016-06-03 DIAGNOSIS — I482 Chronic atrial fibrillation, unspecified: Secondary | ICD-10-CM

## 2016-06-03 LAB — COAGUCHEK XS/INR WAIVED
INR: 2.7 — ABNORMAL HIGH (ref 0.9–1.1)
Prothrombin Time: 32.8 s

## 2016-06-03 MED ORDER — LEVOTHYROXINE SODIUM 88 MCG PO TABS: 88.0000 ug | ORAL_TABLET | Freq: Every day | ORAL | 4 refills | Status: DC

## 2016-06-03 NOTE — Patient Instructions (Signed)
Anticoagulation Dose Instructions as of 06/03/2016      Larry Gates Tue Wed Thu Fri Sat   New Dose 2 mg 1 mg 2 mg 2 mg 2 mg 2 mg 2 mg    Description   Continue taking your warfarin the same way:  Take 2mg  every day of the week except for Mondays take 1mg .  INR today is 2.7 (goal: 2-3)

## 2016-07-04 ENCOUNTER — Other Ambulatory Visit: Payer: Self-pay | Admitting: *Deleted

## 2016-07-29 ENCOUNTER — Ambulatory Visit (INDEPENDENT_AMBULATORY_CARE_PROVIDER_SITE_OTHER): Payer: Medicare Other

## 2016-07-29 ENCOUNTER — Encounter: Payer: Self-pay | Admitting: Family Medicine

## 2016-07-29 ENCOUNTER — Ambulatory Visit (INDEPENDENT_AMBULATORY_CARE_PROVIDER_SITE_OTHER): Payer: Medicare Other | Admitting: Pharmacist Clinician (PhC)/ Clinical Pharmacy Specialist

## 2016-07-29 ENCOUNTER — Ambulatory Visit (INDEPENDENT_AMBULATORY_CARE_PROVIDER_SITE_OTHER): Payer: Medicare Other | Admitting: Family Medicine

## 2016-07-29 VITALS — BP 194/97 | HR 80 | Temp 97.4°F | Ht 67.0 in | Wt 141.0 lb

## 2016-07-29 DIAGNOSIS — I482 Chronic atrial fibrillation, unspecified: Secondary | ICD-10-CM

## 2016-07-29 DIAGNOSIS — I10 Essential (primary) hypertension: Secondary | ICD-10-CM

## 2016-07-29 DIAGNOSIS — M25551 Pain in right hip: Secondary | ICD-10-CM | POA: Diagnosis not present

## 2016-07-29 LAB — COAGUCHEK XS/INR WAIVED
INR: 2.2 — AB (ref 0.9–1.1)
Prothrombin Time: 26.5 s

## 2016-07-29 MED ORDER — CYCLOBENZAPRINE HCL 10 MG PO TABS: 10.0000 mg | ORAL_TABLET | Freq: Three times a day (TID) | ORAL | 0 refills | Status: DC | PRN

## 2016-07-29 MED ORDER — LISINOPRIL 20 MG PO TABS: 20.0000 mg | ORAL_TABLET | Freq: Every day | ORAL | 1 refills | Status: DC

## 2016-07-29 NOTE — Progress Notes (Signed)
BP (!) 194/97 (BP Location: Right Arm, Cuff Size: Normal)   Pulse 80   Temp 97.4 F (36.3 C) (Oral)   Ht _0  (1.702 m)   Wt 141 lb (64 kg)   BMI 22.08 kg/m    Subjective:    [name], male    DOB: 1928-06-14, 83 y.o.   MRN: [id number]  HPI: Larry Gates is a 83 y.o. male presenting on 07/29/2016 for Right hip pain (began about 2 months ago)   HPI Right hip pain Patient comes in with right posterior hip pain that's been going on for the past couple months. He says the pain has been worse when he gets up and is moving around but does not hurt to push on it. He says the pain also gets worse sometimes when he crosses his legs. He denies any weakness or numbness or difficulty walking because of it. He denies any fevers or chills. He rates the hip pain is a 3 out of 10 and it has been improving on its own over the past couple days.  Hypertension Patient's blood pressure is very elevated at 194/97 at rest. He says his blood pressures usually above the 180s most all the time and these never taken any medication for it. He is very hesitant and resistant towards medication for blood pressure. He denies any chest pain or headaches or blurred vision he has been dealing with a blood pressure: For many years"  Relevant past medical, surgical, family and social history reviewed and updated as indicated. Interim medical history since our last visit reviewed. Allergies and medications reviewed and updated.  Review of Systems  Constitutional: Negative for chills and fever.  Respiratory: Negative for shortness of breath and wheezing.   Cardiovascular: Negative for chest pain and leg swelling.  Musculoskeletal: Positive for arthralgias. Negative for back pain and gait problem.  Skin: Negative for rash.  Neurological: Negative for dizziness, weakness, light-headedness and headaches.  All other systems reviewed and are negative.   Per HPI unless specifically indicated above       Objective:    BP (!) 194/97 (BP Location: Right Arm, Cuff Size: Normal)   Pulse 80   Temp 97.4 F (36.3 C) (Oral)   Ht _1  (1.702 m)   Wt 141 lb (64 kg)   BMI 22.08 kg/m   Wt Readings from Last 3 Encounters:  07/29/16 141 lb (64 kg)  10/14/15 145 lb (65.8 kg)  10/12/15 148 lb 8 oz (67.4 kg)    Physical Exam  Constitutional: He is oriented to person, place, and time. He appears well-developed and well-nourished. No distress.  Eyes: Conjunctivae are normal. No scleral icterus.  Cardiovascular: Normal rate, regular rhythm, normal heart sounds and intact distal pulses.   No murmur heard. Pulmonary/Chest: Effort normal and breath sounds normal. No respiratory distress. He has no wheezes. He has no rales.  Musculoskeletal: Normal range of motion. He exhibits no edema.       Right hip: He exhibits tenderness. He exhibits normal range of motion, normal strength, no bony tenderness and no swelling.  Neurological: He is alert and oriented to person, place, and time. Coordination normal.  Skin: Skin is warm and dry. No rash noted. He is not diaphoretic.  Psychiatric: He has a normal mood and affect. His behavior is normal.  Nursing note and vitals reviewed.   Right hip x-ray: Osteophytic changes and early arthritis but otherwise normal right hip x-ray  Assessment & Plan:   Problem List Items Addressed This Visit      Cardiovascular and Mediastinum   Essential hypertension - Primary   Relevant Medications   lisinopril (PRINIVIL,ZESTRIL) 20 MG tablet   Other Relevant Orders   CMP14+EGFR   Lipid panel    Other Visit Diagnoses    Right hip pain       Seems more like right low lumbar muscular pain, will do x-ray   Relevant Medications   cyclobenzaprine (FLEXERIL) 10 MG tablet   Other Relevant Orders   DG HIP UNILAT W OR W/O PELVIS 2-3 VIEWS RIGHT       Follow up plan: Return in about 4 weeks (around 08/26/2016), or if symptoms worsen or fail to improve, for Hypertension  recheck.  Counseling provided for all of the vaccine components Orders Placed This Encounter  Procedures  . CMP14+EGFR  . Lipid panel    Caryl Pina, MD [facility] 07/29/2016, 9:18 AM

## 2016-07-30 LAB — LIPID PANEL
CHOL/HDL RATIO: 2.6 ratio (ref 0.0–5.0)
Cholesterol, Total: 135 mg/dL (ref 100–199)
HDL: 52 mg/dL (ref >39)
LDL Calculated: 70 mg/dL (ref 0–99)
TRIGLYCERIDES: 67 mg/dL (ref 0–149)
VLDL Cholesterol Cal: 13 mg/dL (ref 5–40)

## 2016-07-30 LAB — CMP14+EGFR
A/G RATIO: 1.8 (ref 1.2–2.2)
ALT: 12 IU/L (ref 0–44)
AST: 21 IU/L (ref 0–40)
Albumin: 4 g/dL (ref 3.5–4.7)
Alkaline Phosphatase: 99 IU/L (ref 39–117)
BILIRUBIN TOTAL: 0.9 mg/dL (ref 0.0–1.2)
BUN/Creatinine Ratio: 17 (ref 10–24)
BUN: 18 mg/dL (ref 8–27)
CALCIUM: 9 mg/dL (ref 8.6–10.2)
CHLORIDE: 101 mmol/L (ref 96–106)
CO2: 24 mmol/L (ref 18–29)
Creatinine, Ser: 1.07 mg/dL (ref 0.76–1.27)
GFR calc Af Amer: 73 mL/min/{1.73_m2} (ref >59)
GFR, EST NON AFRICAN AMERICAN: 63 mL/min/{1.73_m2} (ref >59)
GLOBULIN, TOTAL: 2.2 g/dL (ref 1.5–4.5)
Glucose: 96 mg/dL (ref 65–99)
POTASSIUM: 4.4 mmol/L (ref 3.5–5.2)
SODIUM: 140 mmol/L (ref 134–144)
Total Protein: 6.2 g/dL (ref 6.0–8.5)

## 2016-08-26 ENCOUNTER — Other Ambulatory Visit: Payer: Self-pay | Admitting: Family Medicine

## 2016-08-26 ENCOUNTER — Telehealth: Payer: Self-pay | Admitting: Family Medicine

## 2016-08-26 NOTE — Telephone Encounter (Signed)
Coumadin refill called in with directions in chart per pharm D, to [facility].

## 2016-09-23 ENCOUNTER — Ambulatory Visit (INDEPENDENT_AMBULATORY_CARE_PROVIDER_SITE_OTHER): Payer: Medicare Other | Admitting: Pharmacist

## 2016-09-23 DIAGNOSIS — I482 Chronic atrial fibrillation, unspecified: Secondary | ICD-10-CM

## 2016-09-23 LAB — COAGUCHEK XS/INR WAIVED
INR: 3.5 — ABNORMAL HIGH (ref 0.9–1.1)
Prothrombin Time: 41.6 s

## 2016-09-23 NOTE — Patient Instructions (Signed)
Anticoagulation Warfarin Dose Instructions as of 09/23/2016      [name] Tue Wed Thu Fri Sat   New Dose 2 mg 1 mg 2 mg 2 mg 2 mg 2 mg 2 mg    Description   Skip dose tomorrow (6/9) and take 1/2 tablet on Sunday (6/10). Then continue taking your warfarin the same way:  Take 2mg  every day of the week except for Mondays take 1mg .  INR today is 3.5 (goal 2-3)

## 2016-10-21 ENCOUNTER — Ambulatory Visit (INDEPENDENT_AMBULATORY_CARE_PROVIDER_SITE_OTHER): Payer: Medicare Other | Admitting: Pharmacist Clinician (PhC)/ Clinical Pharmacy Specialist

## 2016-10-21 DIAGNOSIS — I482 Chronic atrial fibrillation, unspecified: Secondary | ICD-10-CM

## 2016-10-21 LAB — COAGUCHEK XS/INR WAIVED
INR: 2.8 — AB (ref 0.9–1.1)
PROTHROMBIN TIME: 33.6 s

## 2016-10-21 NOTE — Patient Instructions (Signed)
Anticoagulation Warfarin Dose Instructions as of 10/21/2016      Larry Gates Tue Wed Thu Fri Sat   New Dose 2 mg 1 mg 2 mg 2 mg 2 mg 2 mg 2 mg    Description   Continue taking your warfarin the same way I  NR today is 2.8  (goal 2-3)

## 2016-10-28 ENCOUNTER — Other Ambulatory Visit: Payer: Self-pay | Admitting: Cardiology

## 2016-10-31 NOTE — Telephone Encounter (Signed)
REFILL 

## 2016-12-23 ENCOUNTER — Ambulatory Visit (INDEPENDENT_AMBULATORY_CARE_PROVIDER_SITE_OTHER): Payer: Medicare Other | Admitting: Pharmacist Clinician (PhC)/ Clinical Pharmacy Specialist

## 2016-12-23 DIAGNOSIS — I482 Chronic atrial fibrillation, unspecified: Secondary | ICD-10-CM

## 2016-12-23 LAB — COAGUCHEK XS/INR WAIVED
INR: 2.7 — ABNORMAL HIGH (ref 0.9–1.1)
PROTHROMBIN TIME: 32.3 s

## 2016-12-23 NOTE — Patient Instructions (Signed)
Anticoagulation Warfarin Dose Instructions as of 12/23/2016      [name] Tue Wed Thu Fri Sat   New Dose 2 mg 1 mg 2 mg 2 mg 2 mg 2 mg 2 mg    Description   Continue taking your warfarin the same way   NR today is 2.7  (goal 2-3)

## 2017-01-17 NOTE — Progress Notes (Signed)
HPI The patient has a history of atrial flutter.  He has had a mildly reduce EF but only minor CAD on cath in 2012.  His most recent EF was normal with some LVH. He has had cardioversion x 2.  He seems to have maintained NSR on amiodarone.    Since I last saw him he has been out of his amiodarone.  He insists that I told him he did not need Cardizem.  I don't see any documentation that I told him that.  In fact, he was not taking the 240 mg that I had prescribed previously and I documented that I at least wanted him to be on 120 mg.  He was hypertensive at that appt.  He is back today I am pretty sure only because he could not get his prescriptions filled without an appt.  He actually feels OK.  The patient denies any new symptoms such as chest discomfort, neck or arm discomfort. There has been no new shortness of breath, PND or orthopnea. There have been no reported palpitations, presyncope or syncope.     Allergies  Allergen Reactions  . Penicillins Rash    Current Outpatient Prescriptions  Medication Sig Dispense Refill  . levothyroxine (SYNTHROID, LEVOTHROID) 88 MCG tablet Take 1 tablet (88 mcg total) by mouth daily before breakfast. 90 tablet 4  . warfarin (COUMADIN) 2 MG tablet Take 1 Tablet by mouth once daily 90 tablet 0  . amiodarone (PACERONE) 200 MG tablet Take 1 tablet (200 mg total) by mouth daily. 90 tablet 3  . diltiazem (CARDIZEM CD) 120 MG 24 hr capsule Take 1 capsule (120 mg total) by mouth daily. 90 capsule 3   No current facility-administered medications for this visit.     Past Medical History:  Diagnosis Date  . Anticoagulated on Coumadin    a. Followed @ [facility]  . 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 Dx 07/2010 and coumadin initiated;  b. 08/2010 s/p DCCV.  . Cataract   . Hard of hearing   . Midsternal chest pain    a. In setting of A Flutter 07/2010;  b. Cath 07/2010: nonobs dzs  . Prostate cancer ([place])   . TIA (transient ischemic attack)   . Vertigo     Past  Surgical History:  Procedure Laterality Date  . APPENDECTOMY    . CARDIOVERSION  10/14/2011   Procedure: CARDIOVERSION;  Surgeon: Lelon Perla, MD;  Location: [facility];  Service: Cardiovascular;  Laterality: N/A;  . CARDIOVERSION N/A 07/24/2012   Procedure: CARDIOVERSION;  Surgeon: [name], MD;  Location: [facility];  Service: Cardiovascular;  Laterality: N/A;  . EYE SURGERY Bilateral    cataract  . I&D EXTREMITY Left 05/04/2013   Procedure: IRRIGATION AND DEBRIDEMENT of left hand with repair of lacerations and revision amputation of index finger;  Surgeon: Roseanne Kaufman, MD;  Location: [facility];  Service: Orthopedics;  Laterality: Left;  . TONSILLECTOMY      ROS:   As stated in the HPI and negative for all other systems.  PHYSICAL EXAM BP (!) 180/70   Pulse 92   Ht 5\' 7"  (1.702 m)   Wt 135 lb (61.2 kg)   BMI 21.14 kg/m   GENERAL:  Well appearing NECK:  No jugular venous distention, waveform within normal limits, carotid upstroke brisk and symmetric, no bruits, no thyromegaly LUNGS:  Clear to auscultation bilaterally CHEST:  Unremarkable HEART:  PMI not displaced or sustained,S1 and S2 within normal limits,  no S3, no S4, no clicks, no rubs, no murmurs ABD:  Flat, positive bowel sounds normal in frequency in pitch, no bruits, no rebound, no guarding, no midline pulsatile mass, no hepatomegaly, no splenomegaly EXT:  2 plus pulses throughout, no edema, no cyanosis no clubbing, missing part of his right index finger.  SKIN:  Rash and bruisin   EKG:  NSR, rate 93, QT prolonged, axis WNL, no acute ST T wave changes.    01/19/2017   Lab Results  Component Value Date   TSH 4.790 (H) 06/17/2015   ALT 12 07/29/2016   AST 21 07/29/2016   ALKPHOS 99 07/29/2016   BILITOT 0.9 07/29/2016   PROT 6.2 07/29/2016   ALBUMIN 4.0 07/29/2016     ASSESSMENT AND PLAN  ATRIAL FLUTTER:   Larry Gates has a CHA2DS2 - VASc score of 3 with a risk of stroke of 3.2%.  I have asked him to  get a TSH when he has his labs drawn next month.  He is supposed to be on Cardizem and I will give him his prescription for Cardizem 120 mg daily and refill the amio.  No other med changes.  I did not take him off any meds.   HTN:  The blood pressure is elevated.  Again he has not been taking his Cardizem.  He needs to have follow up of this by his PCP.  He insists that his BP has been like that for forty years.  I don't know if he will be complaint with meds.

## 2017-01-18 ENCOUNTER — Encounter: Payer: Self-pay | Admitting: Cardiology

## 2017-01-18 ENCOUNTER — Ambulatory Visit (INDEPENDENT_AMBULATORY_CARE_PROVIDER_SITE_OTHER): Payer: Medicare Other | Admitting: Cardiology

## 2017-01-18 VITALS — BP 180/70 | HR 92 | Ht 67.0 in | Wt 135.0 lb

## 2017-01-18 DIAGNOSIS — I4892 Unspecified atrial flutter: Secondary | ICD-10-CM | POA: Diagnosis not present

## 2017-01-18 DIAGNOSIS — Z79899 Other long term (current) drug therapy: Secondary | ICD-10-CM | POA: Diagnosis not present

## 2017-01-18 DIAGNOSIS — I10 Essential (primary) hypertension: Secondary | ICD-10-CM

## 2017-01-18 MED ORDER — DILTIAZEM HCL ER COATED BEADS 120 MG PO CP24: 120.0000 mg | ORAL_CAPSULE | Freq: Every day | ORAL | 3 refills | Status: DC

## 2017-01-18 MED ORDER — AMIODARONE HCL 200 MG PO TABS: 200.0000 mg | ORAL_TABLET | Freq: Every day | ORAL | 3 refills | Status: DC

## 2017-01-18 NOTE — Patient Instructions (Addendum)
Medication Instructions:  Please restart Cardizem and Amiodarone. Continue all other medications as listed.  Labwork: Please have at [facility] drawn at your next blood draw (11/9).  Follow-Up: Follow up in 1 year with Dr. Percival Spanish.  You will receive a letter in the mail 2 months before you are due.  Please call us when you receive this letter to schedule your follow up appointment.  If you need a refill on your cardiac medications before your next appointment, please call your pharmacy.  Thank you for choosing [facility]!!

## 2017-01-19 ENCOUNTER — Encounter: Payer: Self-pay | Admitting: Cardiology

## 2017-01-19 DIAGNOSIS — Z79899 Other long term (current) drug therapy: Secondary | ICD-10-CM | POA: Insufficient documentation

## 2017-02-24 ENCOUNTER — Ambulatory Visit: Payer: Medicare Other | Admitting: Pharmacist Clinician (PhC)/ Clinical Pharmacy Specialist

## 2017-02-24 ENCOUNTER — Other Ambulatory Visit: Payer: Self-pay | Admitting: Family Medicine

## 2017-02-24 DIAGNOSIS — I482 Chronic atrial fibrillation, unspecified: Secondary | ICD-10-CM

## 2017-02-24 DIAGNOSIS — I4892 Unspecified atrial flutter: Secondary | ICD-10-CM | POA: Diagnosis not present

## 2017-02-24 DIAGNOSIS — Z79899 Other long term (current) drug therapy: Secondary | ICD-10-CM

## 2017-02-24 DIAGNOSIS — Z23 Encounter for immunization: Secondary | ICD-10-CM

## 2017-02-24 LAB — COAGUCHEK XS/INR WAIVED
INR: 2 — ABNORMAL HIGH (ref 0.9–1.1)
PROTHROMBIN TIME: 24 s

## 2017-02-24 NOTE — Patient Instructions (Signed)
Description   Continue taking your warfarin the same way

## 2017-02-25 LAB — TSH: TSH: 4.9 u[IU]/mL — AB (ref 0.450–4.500)

## 2017-03-03 ENCOUNTER — Other Ambulatory Visit: Payer: Self-pay | Admitting: *Deleted

## 2017-03-03 DIAGNOSIS — R7989 Other specified abnormal findings of blood chemistry: Secondary | ICD-10-CM

## 2017-03-03 DIAGNOSIS — I482 Chronic atrial fibrillation, unspecified: Secondary | ICD-10-CM

## 2017-03-03 NOTE — Progress Notes (Signed)
Orders placed for labs to be drawn at [facility] per Dr Hochrein's orders.

## 2017-03-07 ENCOUNTER — Other Ambulatory Visit: Payer: Medicare Other

## 2017-03-07 DIAGNOSIS — R7989 Other specified abnormal findings of blood chemistry: Secondary | ICD-10-CM

## 2017-03-07 DIAGNOSIS — I482 Chronic atrial fibrillation, unspecified: Secondary | ICD-10-CM

## 2017-03-08 LAB — T4, FREE: FREE T4: 1.36 ng/dL (ref 0.82–1.77)

## 2017-03-08 LAB — T3: T3 TOTAL: 64 ng/dL — AB (ref 71–180)

## 2017-04-07 ENCOUNTER — Ambulatory Visit: Payer: Medicare Other | Admitting: Pharmacist Clinician (PhC)/ Clinical Pharmacy Specialist

## 2017-04-07 DIAGNOSIS — I482 Chronic atrial fibrillation, unspecified: Secondary | ICD-10-CM

## 2017-04-07 LAB — COAGUCHEK XS/INR WAIVED
INR: 2.5 — ABNORMAL HIGH (ref 0.9–1.1)
PROTHROMBIN TIME: 30.1 s

## 2017-04-07 NOTE — Patient Instructions (Signed)
Description   Continue taking warfarin the same way  INR 2.5 (Goal is between 2-3)  Perfect reading

## 2017-05-15 ENCOUNTER — Other Ambulatory Visit: Payer: Self-pay

## 2017-05-15 MED ORDER — LEVOTHYROXINE SODIUM 88 MCG PO TABS: 88.0000 ug | ORAL_TABLET | Freq: Every day | ORAL | 3 refills | Status: DC

## 2017-06-02 ENCOUNTER — Ambulatory Visit: Payer: Medicare Other | Admitting: Pharmacist Clinician (PhC)/ Clinical Pharmacy Specialist

## 2017-06-02 DIAGNOSIS — I482 Chronic atrial fibrillation, unspecified: Secondary | ICD-10-CM

## 2017-06-02 LAB — COAGUCHEK XS/INR WAIVED
INR: 2.7 — AB (ref 0.9–1.1)
PROTHROMBIN TIME: 32.1 s

## 2017-06-02 MED ORDER — WARFARIN SODIUM 2 MG PO TABS: 2.0000 mg | ORAL_TABLET | Freq: Every day | ORAL | 3 refills | Status: DC

## 2017-06-02 NOTE — Patient Instructions (Signed)
Description   Continue taking warfarin the same way  INR 2.7 (Goal is between 2-3)  Perfect reading

## 2017-06-23 ENCOUNTER — Telehealth: Payer: Self-pay | Admitting: Cardiology

## 2017-06-23 NOTE — Telephone Encounter (Signed)
New Message:   She calling about an interaction between Warfarin and Amiodarone.

## 2017-06-23 NOTE — Telephone Encounter (Signed)
Called pharmacy - okay refill

## 2017-06-27 DIAGNOSIS — H04123 Dry eye syndrome of bilateral lacrimal glands: Secondary | ICD-10-CM | POA: Diagnosis not present

## 2017-06-27 DIAGNOSIS — H40033 Anatomical narrow angle, bilateral: Secondary | ICD-10-CM | POA: Diagnosis not present

## 2017-07-28 ENCOUNTER — Ambulatory Visit: Payer: Medicare Other | Admitting: Pharmacist Clinician (PhC)/ Clinical Pharmacy Specialist

## 2017-07-28 ENCOUNTER — Ambulatory Visit (INDEPENDENT_AMBULATORY_CARE_PROVIDER_SITE_OTHER): Payer: Medicare Other | Admitting: Family Medicine

## 2017-07-28 ENCOUNTER — Encounter: Payer: Self-pay | Admitting: Family Medicine

## 2017-07-28 VITALS — BP 188/99 | HR 77 | Temp 97.5°F | Ht 67.0 in | Wt 136.0 lb

## 2017-07-28 DIAGNOSIS — I4891 Unspecified atrial fibrillation: Secondary | ICD-10-CM

## 2017-07-28 DIAGNOSIS — M62838 Other muscle spasm: Secondary | ICD-10-CM | POA: Diagnosis not present

## 2017-07-28 LAB — COAGUCHEK XS/INR WAIVED
INR: 2 — ABNORMAL HIGH (ref 0.9–1.1)
PROTHROMBIN TIME: 23.6 s

## 2017-07-28 MED ORDER — BACLOFEN 10 MG PO TABS
10.0000 mg | ORAL_TABLET | Freq: Every evening | ORAL | 0 refills | Status: DC | PRN
Start: 2017-07-28 — End: 2018-01-31

## 2017-07-28 NOTE — Progress Notes (Signed)
BP (!) 188/99   Pulse 77   Temp (!) 97.5 F (36.4 C) (Oral)   Ht 5\' 7"  (1.702 m)   Wt 136 lb (61.7 kg)   BMI 21.30 kg/m    Subjective:    [name], male    DOB: 1929-03-02, 84 y.o.   MRN: [id number]  HPI: Larry Gates is a 84 y.o. male presenting on 07/28/2017 for Neck Pain (x 1 month, has not taken anything at home)   HPI Neck muscle spasm Patient is coming in with neck pain on the right side of his neck but also a little bit on the left side of his neck that is been going on for about a month.  He says is especially worse when he gets up in the morning and when he tries to turn his head from side to side.  It also is worse if he has been bent down for a while and then tries to look up he feels a lot of pain in the muscles of his neck.  He denies any numbness or weakness or pain radiating anywhere else.  Relevant past medical, surgical, family and social history reviewed and updated as indicated. Interim medical history since our last visit reviewed. Allergies and medications reviewed and updated.  Review of Systems  Constitutional: Negative for chills and fever.  Respiratory: Negative for shortness of breath and wheezing.   Cardiovascular: Negative for chest pain and leg swelling.  Musculoskeletal: Positive for myalgias and neck pain. Negative for back pain and gait problem.  Skin: Negative for rash.  All other systems reviewed and are negative.   Per HPI unless specifically indicated above   Allergies as of 07/28/2017      Reactions   Penicillins Rash      Medication List        Accurate as of 07/28/17  9:20 AM. Always use your most recent med list.          amiodarone 200 MG tablet Commonly known as:  PACERONE Take 1 tablet (200 mg total) by mouth daily.   baclofen 10 MG tablet Commonly known as:  LIORESAL Take 1 tablet (10 mg total) by mouth at bedtime as needed for muscle spasms.   diltiazem 120 MG 24 hr capsule Commonly known as:   CARDIZEM CD Take 1 capsule (120 mg total) by mouth daily.   levothyroxine 88 MCG tablet Commonly known as:  SYNTHROID, LEVOTHROID Take 1 tablet (88 mcg total) by mouth daily before breakfast.   warfarin 2 MG tablet Commonly known as:  COUMADIN Take as directed by the anticoagulation clinic. If you are unsure how to take this medication, talk to your nurse or doctor. Original instructions:  Take 1 tablet (2 mg total) by mouth daily.          Objective:    BP (!) 188/99   Pulse 77   Temp (!) 97.5 F (36.4 C) (Oral)   Ht 5\' 7"  (1.702 m)   Wt 136 lb (61.7 kg)   BMI 21.30 kg/m   Wt Readings from Last 3 Encounters:  07/28/17 136 lb (61.7 kg)  01/18/17 135 lb (61.2 kg)  07/29/16 141 lb (64 kg)    Physical Exam  Constitutional: He is oriented to person, place, and time. He appears well-developed and well-nourished. No distress.  Eyes: Pupils are equal, round, and reactive to light. Conjunctivae and EOM are normal. Right eye exhibits no discharge. No scleral icterus.  Neck: Neck  supple. No thyromegaly present.  Musculoskeletal: He exhibits no edema.       Cervical back: He exhibits decreased range of motion (Range of motion of rotation of the neck slightly limited because of pain) and tenderness. He exhibits no bony tenderness and no swelling.       Back:  Lymphadenopathy:    He has no cervical adenopathy.  Neurological: He is alert and oriented to person, place, and time. Coordination normal.  Skin: Skin is warm and dry. No rash noted. He is not diaphoretic.  Psychiatric: He has a normal mood and affect. His behavior is normal.  Nursing note and vitals reviewed.       Assessment & Plan:   Problem List Items Addressed This Visit    None    Visit Diagnoses    Neck muscle spasm    -  Primary   Relevant Medications   baclofen (LIORESAL) 10 MG tablet   Other Relevant Orders   Ambulatory referral to Physical Therapy       Follow up plan: Return if symptoms worsen or  fail to improve.  Counseling provided for all of the vaccine components Orders Placed This Encounter  Procedures  . Ambulatory referral to Physical Therapy    Caryl Pina, MD [facility] 07/28/2017, 9:20 AM

## 2017-07-28 NOTE — Patient Instructions (Signed)
Description   Continue taking warfarin the same way  INR 2.0 (Goal is between 2-3)  Perfect reading

## 2017-07-31 ENCOUNTER — Other Ambulatory Visit: Payer: Self-pay | Admitting: Cardiology

## 2017-08-05 ENCOUNTER — Emergency Department (HOSPITAL_COMMUNITY): Payer: Medicare Other

## 2017-08-05 ENCOUNTER — Encounter (HOSPITAL_COMMUNITY): Payer: Self-pay | Admitting: *Deleted

## 2017-08-05 ENCOUNTER — Emergency Department (HOSPITAL_COMMUNITY)
Admission: EM | Admit: 2017-08-05 | Discharge: 2017-08-05 | Disposition: A | Discharge status: Home / Self Care | Payer: Medicare Other | Attending: Emergency Medicine | Admitting: Emergency Medicine

## 2017-08-05 DIAGNOSIS — Z79899 Other long term (current) drug therapy: Secondary | ICD-10-CM | POA: Diagnosis not present

## 2017-08-05 DIAGNOSIS — Z8546 Personal history of malignant neoplasm of prostate: Secondary | ICD-10-CM | POA: Insufficient documentation

## 2017-08-05 DIAGNOSIS — E041 Nontoxic single thyroid nodule: Secondary | ICD-10-CM

## 2017-08-05 DIAGNOSIS — Y998 Other external cause status: Secondary | ICD-10-CM | POA: Diagnosis not present

## 2017-08-05 DIAGNOSIS — I10 Essential (primary) hypertension: Secondary | ICD-10-CM | POA: Insufficient documentation

## 2017-08-05 DIAGNOSIS — I484 Atypical atrial flutter: Secondary | ICD-10-CM | POA: Insufficient documentation

## 2017-08-05 DIAGNOSIS — W19XXXA Unspecified fall, initial encounter: Secondary | ICD-10-CM | POA: Diagnosis not present

## 2017-08-05 DIAGNOSIS — Y939 Activity, unspecified: Secondary | ICD-10-CM | POA: Insufficient documentation

## 2017-08-05 DIAGNOSIS — Z8673 Personal history of transient ischemic attack (TIA), and cerebral infarction without residual deficits: Secondary | ICD-10-CM | POA: Insufficient documentation

## 2017-08-05 DIAGNOSIS — E785 Hyperlipidemia, unspecified: Secondary | ICD-10-CM | POA: Insufficient documentation

## 2017-08-05 DIAGNOSIS — Y929 Unspecified place or not applicable: Secondary | ICD-10-CM | POA: Diagnosis not present

## 2017-08-05 DIAGNOSIS — S0990XA Unspecified injury of head, initial encounter: Secondary | ICD-10-CM | POA: Insufficient documentation

## 2017-08-05 DIAGNOSIS — S42211A Unspecified displaced fracture of surgical neck of right humerus, initial encounter for closed fracture: Secondary | ICD-10-CM | POA: Diagnosis not present

## 2017-08-05 DIAGNOSIS — Z7901 Long term (current) use of anticoagulants: Secondary | ICD-10-CM | POA: Insufficient documentation

## 2017-08-05 DIAGNOSIS — S199XXA Unspecified injury of neck, initial encounter: Secondary | ICD-10-CM | POA: Diagnosis not present

## 2017-08-05 DIAGNOSIS — S4991XA Unspecified injury of right shoulder and upper arm, initial encounter: Secondary | ICD-10-CM | POA: Diagnosis present

## 2017-08-05 DIAGNOSIS — S6991XA Unspecified injury of right wrist, hand and finger(s), initial encounter: Secondary | ICD-10-CM | POA: Diagnosis not present

## 2017-08-05 DIAGNOSIS — M25511 Pain in right shoulder: Secondary | ICD-10-CM | POA: Diagnosis not present

## 2017-08-05 DIAGNOSIS — M25531 Pain in right wrist: Secondary | ICD-10-CM | POA: Diagnosis not present

## 2017-08-05 DIAGNOSIS — S42201A Unspecified fracture of upper end of right humerus, initial encounter for closed fracture: Secondary | ICD-10-CM | POA: Diagnosis not present

## 2017-08-05 LAB — CBC WITH DIFFERENTIAL/PLATELET
BASOS PCT: 0 %
Basophils Absolute: 0 10*3/uL (ref 0.0–0.1)
Eosinophils Absolute: 0 10*3/uL (ref 0.0–0.7)
Eosinophils Relative: 0 %
HCT: 39 % (ref 39.0–52.0)
HEMOGLOBIN: 13.4 g/dL (ref 13.0–17.0)
Lymphocytes Relative: 4 %
Lymphs Abs: 0.4 10*3/uL — ABNORMAL LOW (ref 0.7–4.0)
MCH: 32.1 pg (ref 26.0–34.0)
MCHC: 34.4 g/dL (ref 30.0–36.0)
MCV: 93.3 fL (ref 78.0–100.0)
MONOS PCT: 10 %
Monocytes Absolute: 0.9 10*3/uL (ref 0.1–1.0)
NEUTROS ABS: 8 10*3/uL — AB (ref 1.7–7.7)
NEUTROS PCT: 86 %
Platelets: 147 10*3/uL — ABNORMAL LOW (ref 150–400)
RBC: 4.18 MIL/uL — AB (ref 4.22–5.81)
RDW: 14.4 % (ref 11.5–15.5)
WBC: 9.3 10*3/uL (ref 4.0–10.5)

## 2017-08-05 LAB — PROTIME-INR
INR: 2.06
Prothrombin Time: 23 seconds — ABNORMAL HIGH (ref 11.4–15.2)

## 2017-08-05 LAB — BASIC METABOLIC PANEL
Anion gap: 9 (ref 5–15)
BUN: 26 mg/dL — AB (ref 6–20)
CO2: 24 mmol/L (ref 22–32)
Calcium: 9.2 mg/dL (ref 8.9–10.3)
Chloride: 108 mmol/L (ref 101–111)
Creatinine, Ser: 1.03 mg/dL (ref 0.61–1.24)
GFR calc Af Amer: >60 mL/min (ref >60)
GLUCOSE: 126 mg/dL — AB (ref 65–99)
Potassium: 4.6 mmol/L (ref 3.5–5.1)
Sodium: 141 mmol/L (ref 135–145)

## 2017-08-05 MED ORDER — OXYCODONE-ACETAMINOPHEN 5-325 MG PO TABS: 1.0000 | ORAL_TABLET | ORAL | 0 refills | Status: DC | PRN

## 2017-08-05 MED ORDER — FENTANYL CITRATE (PF) 100 MCG/2ML IJ SOLN
100.0000 ug | Freq: Once | INTRAMUSCULAR | Status: AC
  Administered 2017-08-05: 100 ug via INTRAVENOUS
  Filled 2017-08-05: qty 2

## 2017-08-05 MED ORDER — OXYCODONE-ACETAMINOPHEN 5-325 MG PO TABS
1.0000 | ORAL_TABLET | Freq: Once | ORAL | Status: AC
  Administered 2017-08-05: 1 via ORAL
  Filled 2017-08-05: qty 1

## 2017-08-05 NOTE — ED Triage Notes (Addendum)
Pt fell today around 1 this afternoon, appears dislocated to right shoulder.  Pt states unable to lift right arm.  Pt hit his head and is on blood thinners, family member denies LOC.

## 2017-08-05 NOTE — ED Provider Notes (Signed)
[facility] EMERGENCY DEPARTMENT Provider Note   CSN: [id number] Arrival date & time: 08/05/17  1947     History   Chief Complaint Chief Complaint  Patient presents with  . Fall    HPI Larry Gates is a 84 y.o. male.   Fall  This is a new problem. The current episode started less than 1 hour ago. The problem occurs constantly. The problem has not changed since onset.Associated symptoms comments: Right shoulder pain and swelling. Nothing aggravates the symptoms. Nothing relieves the symptoms. He has tried nothing for the symptoms. The treatment provided no relief.    Past Medical History:  Diagnosis Date  . Anticoagulated on Coumadin    a. Followed @ [facility]  . Atypical atrial flutter ([place])    a. Dx 07/2010 and coumadin initiated;  b. 08/2010 s/p DCCV.  . Cataract   . Hard of hearing   . Midsternal chest pain    a. In setting of A Flutter 07/2010;  b. Cath 07/2010: nonobs dzs  . Prostate cancer ([place])   . TIA (transient ischemic attack)   . Vertigo     Patient Active Problem List   Diagnosis Date Noted  . Long-term use of high-risk medication 01/19/2017  . Accelerated hypertension 12/08/2014  . Thyroid activity decreased 12/08/2014  . Essential hypertension 08/09/2012  . Atrial flutter ([place]) 08/30/2010  . Hyperlipidemia 08/30/2010  . Long term current use of anticoagulant therapy 08/24/2010  . Atrial fibrillation, chronic ([place]) 08/17/2010    Past Surgical History:  Procedure Laterality Date  . APPENDECTOMY    . CARDIOVERSION  10/14/2011   Procedure: CARDIOVERSION;  Surgeon: Lelon Perla, MD;  Location: [place];  Service: Cardiovascular;  Laterality: N/A;  . CARDIOVERSION N/A 07/24/2012   Procedure: CARDIOVERSION;  Surgeon: [name], MD;  Location: [place];  Service: Cardiovascular;  Laterality: N/A;  . EYE SURGERY Bilateral    cataract  . I&D EXTREMITY Left 05/04/2013   Procedure: IRRIGATION AND DEBRIDEMENT of left hand with repair of lacerations and  revision amputation of index finger;  Surgeon: Roseanne Kaufman, MD;  Location: [place];  Service: Orthopedics;  Laterality: Left;  . TONSILLECTOMY          Home Medications    Prior to Admission medications   Medication Sig Start Date End Date Taking? Authorizing Provider  amiodarone (PACERONE) 200 MG tablet Take 1 tablet (200 mg total) by mouth daily. 01/18/17   Minus Breeding, MD  baclofen (LIORESAL) 10 MG tablet Take 1 tablet (10 mg total) by mouth at bedtime as needed for muscle spasms. 07/28/17   Dettinger, Fransisca Kaufmann, MD  diltiazem (CARDIZEM CD) 120 MG 24 hr capsule Take 1 capsule (120 mg total) by mouth daily. 01/18/17   Minus Breeding, MD  levothyroxine (SYNTHROID, LEVOTHROID) 88 MCG tablet Take 1 tablet (88 mcg total) by mouth daily before breakfast. 05/15/17   Claretta Fraise, MD  oxyCODONE-acetaminophen (PERCOCET/ROXICET) 5-325 MG tablet Take 1-2 tablets by mouth every 4 (four) hours as needed for severe pain. 08/05/17   Frankie Zito, Corene Cornea, MD  oxyCODONE-acetaminophen (PERCOCET/ROXICET) 5-325 MG tablet Take 1-2 tablets by mouth every 4 (four) hours as needed for severe pain. 08/05/17   Marshe Shrestha, Corene Cornea, MD  warfarin (COUMADIN) 2 MG tablet Take 1 tablet (2 mg total) by mouth daily. 06/02/17   Memory Argue, PharmD    Family History Family History  Problem Relation Age of Onset  . CVA Father   . Alzheimer's disease Father   . Leukemia Father   .  Alzheimer's disease Mother   . Heart Problems Brother        stents in his 56's-80's  . Early death Sister   . Early death Brother     Social History Social History   Tobacco Use  . Smoking status: Never Smoker  . Smokeless tobacco: Never Used  Substance Use Topics  . Alcohol use: No  . Drug use: No     Allergies   Penicillins   Review of Systems Review of Systems  All other systems reviewed and are negative.    Physical Exam Updated Vital Signs BP (!) 144/74   Pulse 80   Temp 98.7 F (37.1 C) (Oral)   Resp 17   Ht 5'  6" (1.676 m)   Wt 61.7 kg (136 lb)   SpO2 99%   BMI 21.95 kg/m   Physical Exam  Constitutional: He is oriented to person, place, and time. He appears well-developed and well-nourished.  HENT:  Head: Normocephalic and atraumatic.  Eyes: Conjunctivae and EOM are normal.  Neck: Normal range of motion.  Cardiovascular: Normal rate.  Pulmonary/Chest: Effort normal. No respiratory distress.  Abdominal: He exhibits no distension.  Musculoskeletal: Normal range of motion. He exhibits edema and tenderness.  No cervical spine tenderness, thoracic spine tenderness or Lumbar spine tenderness.  No tenderness or pain with palpation and full ROM of all joints in lower extremities or left upper extremity. Has large amount of edema, ecchymosis and pain with palpation of right upper humerus. Ecchymosis and edema occludes ability to locate landmarks  No ecchymosis or other signs of trauma on back or extremities.  No Pain with AP or lateral compression of ribs.  No Paracervical ttp, paraspinal ttp   Neurological: He is alert and oriented to person, place, and time.  Nursing note and vitals reviewed.    ED Treatments / Results  Labs (all labs ordered are listed, but only abnormal results are displayed) Labs Reviewed  CBC WITH DIFFERENTIAL/PLATELET - Abnormal; Notable for the following components:      Result Value   RBC 4.18 (*)    Platelets 147 (*)    Neutro Abs 8.0 (*)    Lymphs Abs 0.4 (*)    All other components within normal limits  BASIC METABOLIC PANEL - Abnormal; Notable for the following components:   Glucose, Bld 126 (*)    BUN 26 (*)    All other components within normal limits  PROTIME-INR - Abnormal; Notable for the following components:   Prothrombin Time 23.0 (*)    All other components within normal limits    EKG None  Radiology Dg Shoulder Right  Result Date: 08/05/2017 CLINICAL DATA:  Recent fall with right shoulder pain, initial encounter EXAM: RIGHT SHOULDER - 2+  VIEW COMPARISON:  07/29/2013 FINDINGS: There is an impacted fracture involving the surgical neck of the proximal right humerus. Humeral head is well seated. Mild displacement of the distal shaft is noted as well. Some old rib fractures are noted on the right with healing. IMPRESSION: Impacted surgical neck fracture Electronically Signed   By: Inez Catalina M.D.   On: 08/05/2017 21:40   Dg Wrist Complete Right  Result Date: 08/05/2017 CLINICAL DATA:  Recent fall with wrist pain, initial encounter EXAM: RIGHT WRIST - COMPLETE 3+ VIEW COMPARISON:  02/02/2013 FINDINGS: Mild widening of the scapholunate space is noted likely related to previous ligamentous injury. Diffuse vascular calcifications are noted. No acute fracture or dislocation is seen. Radiopaque foreign body is noted in  the anterior soft tissues of the wrist likely of a chronic nature. IMPRESSION: Chronic changes without acute abnormality. Electronically Signed   By: Inez Catalina M.D.   On: 08/05/2017 21:42   Ct Head Wo Contrast  Result Date: 08/05/2017 CLINICAL DATA:  Status post fall from standing position. Hit head, with concern for head or cervical spine injury. Patient on blood thinners. EXAM: CT HEAD WITHOUT CONTRAST CT CERVICAL SPINE WITHOUT CONTRAST TECHNIQUE: Multidetector CT imaging of the head and cervical spine was performed following the standard protocol without intravenous contrast. Multiplanar CT image reconstructions of the cervical spine were also generated. COMPARISON:  CT of the head performed 02/02/2013 FINDINGS: CT HEAD FINDINGS Brain: No evidence of acute infarction, hemorrhage, hydrocephalus, extra-axial collection or mass lesion/mass effect. Mild chronic encephalomalacia is noted at the inferior right frontal lobe. The posterior fossa, including the cerebellum, brainstem and fourth ventricle, is within normal limits. The third and lateral ventricles, and basal ganglia are unremarkable in appearance. No mass effect or midline  shift is seen. Vascular: No hyperdense vessel or unexpected calcification. Skull: There is no evidence of fracture; visualized osseous structures are unremarkable in appearance. Sinuses/Orbits: The orbits are within normal limits. Chronic periosteal thickening is noted at the right maxillary sinus, reflecting remote sinus disease. The paranasal sinuses and mastoid air cells are well-aerated. Other: No significant soft tissue abnormalities are seen. CT CERVICAL SPINE FINDINGS Alignment: There is minimal grade 1 anterolisthesis of C3 on C4, reflecting underlying facet disease. Skull base and vertebrae: No acute fracture. There is mild chronic loss of height at vertebral body T1. No primary bone lesion or focal pathologic process. Soft tissues and spinal canal: No prevertebral fluid or swelling. No visible canal hematoma. Disc levels: Intervertebral disc spaces are preserved. The bony foramina are grossly unremarkable. Upper chest: A vague 2.0 cm hypodensity is noted at the right thyroid lobe. The visualized lung apices are clear. Mild calcification is noted at the left carotid bifurcation. Other: No additional soft tissue abnormalities are seen. IMPRESSION: 1. No evidence of traumatic intracranial injury or fracture. 2. No evidence of fracture or subluxation along the cervical spine. 3. Mild chronic encephalomalacia at the right inferior frontal lobe. 4. Mild chronic loss of height at vertebral body T1. 5. **An incidental finding of potential clinical significance has been found. Vague 2.0 cm hypodensity at the right thyroid lobe. Consider further evaluation with thyroid ultrasound. If patient is clinically hyperthyroid, consider nuclear medicine thyroid uptake and scan.** 6. Mild calcification at the left carotid bifurcation. Electronically Signed   By: Garald Balding M.D.   On: 08/05/2017 21:50   Ct Cervical Spine Wo Contrast  Result Date: 08/05/2017 CLINICAL DATA:  Status post fall from standing position. Hit  head, with concern for head or cervical spine injury. Patient on blood thinners. EXAM: CT HEAD WITHOUT CONTRAST CT CERVICAL SPINE WITHOUT CONTRAST TECHNIQUE: Multidetector CT imaging of the head and cervical spine was performed following the standard protocol without intravenous contrast. Multiplanar CT image reconstructions of the cervical spine were also generated. COMPARISON:  CT of the head performed 02/02/2013 FINDINGS: CT HEAD FINDINGS Brain: No evidence of acute infarction, hemorrhage, hydrocephalus, extra-axial collection or mass lesion/mass effect. Mild chronic encephalomalacia is noted at the inferior right frontal lobe. The posterior fossa, including the cerebellum, brainstem and fourth ventricle, is within normal limits. The third and lateral ventricles, and basal ganglia are unremarkable in appearance. No mass effect or midline shift is seen. Vascular: No hyperdense vessel or unexpected calcification. Skull:  There is no evidence of fracture; visualized osseous structures are unremarkable in appearance. Sinuses/Orbits: The orbits are within normal limits. Chronic periosteal thickening is noted at the right maxillary sinus, reflecting remote sinus disease. The paranasal sinuses and mastoid air cells are well-aerated. Other: No significant soft tissue abnormalities are seen. CT CERVICAL SPINE FINDINGS Alignment: There is minimal grade 1 anterolisthesis of C3 on C4, reflecting underlying facet disease. Skull base and vertebrae: No acute fracture. There is mild chronic loss of height at vertebral body T1. No primary bone lesion or focal pathologic process. Soft tissues and spinal canal: No prevertebral fluid or swelling. No visible canal hematoma. Disc levels: Intervertebral disc spaces are preserved. The bony foramina are grossly unremarkable. Upper chest: A vague 2.0 cm hypodensity is noted at the right thyroid lobe. The visualized lung apices are clear. Mild calcification is noted at the left carotid  bifurcation. Other: No additional soft tissue abnormalities are seen. IMPRESSION: 1. No evidence of traumatic intracranial injury or fracture. 2. No evidence of fracture or subluxation along the cervical spine. 3. Mild chronic encephalomalacia at the right inferior frontal lobe. 4. Mild chronic loss of height at vertebral body T1. 5. **An incidental finding of potential clinical significance has been found. Vague 2.0 cm hypodensity at the right thyroid lobe. Consider further evaluation with thyroid ultrasound. If patient is clinically hyperthyroid, consider nuclear medicine thyroid uptake and scan.** 6. Mild calcification at the left carotid bifurcation. Electronically Signed   By: Garald Balding M.D.   On: 08/05/2017 21:50    Procedures Procedures (including critical care time)  Medications Ordered in ED Medications  fentaNYL (SUBLIMAZE) injection 100 mcg (100 mcg Intravenous Given 08/05/17 2059)  oxyCODONE-acetaminophen (PERCOCET/ROXICET) 5-325 MG per tablet 1 tablet (1 tablet Oral Given 08/05/17 2205)     Initial Impression / Assessment and Plan / ED Course  I have reviewed the triage vital signs and the nursing notes.  Pertinent labs & imaging results that were available during my care of the patient were reviewed by me and considered in my medical decision making (see chart for details).    Dislocation vs fracture with large ecchymosis causing apparent deformity. Also hit head but atraumatic on exam, however is on coumadin.  Head CT, neck CT, XR R shoulder, pain medications. Patient with a shoulder fracture but normal INR.  No evidence of wrist fracture or bleed in his head.  Does have a thyroid nodule that needs to be evaluated by his primary doctor.  Patient was put in a sling immobilizer given the phone number for orthopedic follow-up. Pain medication provided.  Patient stable for discharge at this time.  Final Clinical Impressions(s) / ED Diagnoses   Final diagnoses:  Fall, initial  encounter  Closed displaced fracture of surgical neck of right humerus, unspecified fracture morphology, initial encounter  Thyroid nodule    ED Discharge Orders        Ordered    oxyCODONE-acetaminophen (PERCOCET/ROXICET) 5-325 MG tablet  Every 4 hours PRN     08/05/17 2154    oxyCODONE-acetaminophen (PERCOCET/ROXICET) 5-325 MG tablet  Every 4 hours PRN     08/05/17 2155       Koji Niehoff, Corene Cornea, MD 08/05/17 2208

## 2017-08-06 NOTE — ED Notes (Signed)
[place] inquired about changing frequency of discharge prescription from 1-2 5/325mg  vicodin every 4 hours to 1 5/325mg  vicodin . Consulted PA and received verbal order to change prescription. Henry aware.

## 2017-08-09 ENCOUNTER — Encounter (INDEPENDENT_AMBULATORY_CARE_PROVIDER_SITE_OTHER): Payer: Self-pay | Admitting: Physician Assistant

## 2017-08-09 ENCOUNTER — Ambulatory Visit (INDEPENDENT_AMBULATORY_CARE_PROVIDER_SITE_OTHER): Payer: Medicare Other | Admitting: Physician Assistant

## 2017-08-09 ENCOUNTER — Ambulatory Visit: Payer: Medicare Other | Admitting: Physical Therapy

## 2017-08-09 DIAGNOSIS — S42201A Unspecified fracture of upper end of right humerus, initial encounter for closed fracture: Secondary | ICD-10-CM | POA: Insufficient documentation

## 2017-08-09 NOTE — Progress Notes (Signed)
Office Visit Note   Patient: Larry Gates           Date of Birth: May 02, 1928           MRN: [id number] Visit Date: 08/09/2017              Requested by: Claretta Fraise, MD [place], [place] [id number] PCP: Claretta Fraise, MD   Assessment & Plan: Visit Diagnoses: No diagnosis found.  Plan: He will continue to use the arm sling when he is sleeping and ambulating.  He can come out of the sling for gentle range of motion of the elbow forearm wrist and hand.  Advised to get a squeeze ball to help with swelling of the right hand.  We will see him back in 2 weeks to obtain AP Y view of the right shoulder.  No forward flexion or abduction of the shoulder.  Follow-Up Instructions: Return in about 2 weeks (around 08/23/2017) for Radiographs.   Orders:  No orders of the defined types were placed in this encounter.  No orders of the defined types were placed in this encounter.     Procedures: No procedures performed   Clinical Data: No additional findings.   Subjective: Chief Complaint  Patient presents with  . Right Elbow - Pain    HPI Larry Gates is an 84 year old male who was helping install a ceiling fan this past Sunday and lost his balance falling mainly on his right shoulder.  He was seen in the ER where he was found to have a right proximal humeral shaft fracture that was impacted.  Humeral head is well located.  No other acute fractures noted.  I personally reviewed these films and reviewed films with patient and his family was present today. He was  placed in arm sling and was told to follow-up here.  He notes he is having significant swelling in the right arm.  He was given oxycodone he is taking a couple of tablets since his ER visit.  He denies any loss of consciousness chest pain shortness of breath fevers chills. Review of Systems Please see HPI  Objective: Vital Signs: There were no vitals taken for this visit.  Physical Exam  Constitutional: He is  oriented to person, place, and time. He appears well-developed and well-nourished. No distress.  Neurological: He is alert and oriented to person, place, and time.  Skin: He is not diaphoretic.  Psychiatric: He has a normal mood and affect.    Ortho Exam Nontender over the right clavicle.  He has tenderness over the proximal right humerus.  Good range of motion of the right elbow forearm wrist and hand without pain.  Radial pulse intact.  He has a laceration to the right elbow without any signs of infection. Specialty Comments:  No specialty comments available.  Imaging: No results found.   PMFS History: Patient Active Problem List   Diagnosis Date Noted  . Long-term use of high-risk medication 01/19/2017  . Accelerated hypertension 12/08/2014  . Thyroid activity decreased 12/08/2014  . Essential hypertension 08/09/2012  . Atrial flutter ([place]) 08/30/2010  . Hyperlipidemia 08/30/2010  . Long term current use of anticoagulant therapy 08/24/2010  . Atrial fibrillation, chronic ([place]) 08/17/2010   Past Medical History:  Diagnosis Date  . Anticoagulated on Coumadin    a. Followed @ [facility]  . Atypical atrial flutter ([place])    a. Dx 07/2010 and coumadin initiated;  b. 08/2010 s/p DCCV.  . Cataract   .  Hard of hearing   . Midsternal chest pain    a. In setting of A Flutter 07/2010;  b. Cath 07/2010: nonobs dzs  . Prostate cancer ([place])   . TIA (transient ischemic attack)   . Vertigo     Family History  Problem Relation Age of Onset  . CVA Father   . Alzheimer's disease Father   . Leukemia Father   . Alzheimer's disease Mother   . Heart Problems Brother        stents in his 67's-80's  . Early death Sister   . Early death Brother     Past Surgical History:  Procedure Laterality Date  . APPENDECTOMY    . CARDIOVERSION  10/14/2011   Procedure: CARDIOVERSION;  Surgeon: Lelon Perla, MD;  Location: [place];  Service: Cardiovascular;  Laterality: N/A;  . CARDIOVERSION N/A 07/24/2012     Procedure: CARDIOVERSION;  Surgeon: [name], MD;  Location: [place];  Service: Cardiovascular;  Laterality: N/A;  . EYE SURGERY Bilateral    cataract  . I&D EXTREMITY Left 05/04/2013   Procedure: IRRIGATION AND DEBRIDEMENT of left hand with repair of lacerations and revision amputation of index finger;  Surgeon: Roseanne Kaufman, MD;  Location: [place];  Service: Orthopedics;  Laterality: Left;  . TONSILLECTOMY     Social History   Occupational History  . Not on file  Tobacco Use  . Smoking status: Never Smoker  . Smokeless tobacco: Never Used  Substance and Sexual Activity  . Alcohol use: No  . Drug use: No  . Sexual activity: Never

## 2017-08-11 ENCOUNTER — Other Ambulatory Visit: Payer: Self-pay | Admitting: Cardiology

## 2017-08-14 MED FILL — Oxycodone w/ Acetaminophen Tab 5-325 MG: ORAL | Qty: 6 | Status: AC

## 2017-08-14 NOTE — Telephone Encounter (Signed)
REFILL 

## 2017-08-23 ENCOUNTER — Ambulatory Visit (INDEPENDENT_AMBULATORY_CARE_PROVIDER_SITE_OTHER): Payer: Medicare Other

## 2017-08-23 ENCOUNTER — Ambulatory Visit (INDEPENDENT_AMBULATORY_CARE_PROVIDER_SITE_OTHER): Payer: Medicare Other | Admitting: Physician Assistant

## 2017-08-23 ENCOUNTER — Encounter (INDEPENDENT_AMBULATORY_CARE_PROVIDER_SITE_OTHER): Payer: Self-pay | Admitting: Physician Assistant

## 2017-08-23 DIAGNOSIS — S42201A Unspecified fracture of upper end of right humerus, initial encounter for closed fracture: Secondary | ICD-10-CM

## 2017-08-23 DIAGNOSIS — S42301A Unspecified fracture of shaft of humerus, right arm, initial encounter for closed fracture: Secondary | ICD-10-CM | POA: Diagnosis not present

## 2017-08-23 NOTE — Progress Notes (Signed)
Office Visit Note   Patient: Larry Gates           Date of Birth: [birth year]           MRN: [id number] Visit Date: 08/23/2017              Requested by: Claretta Fraise, MD [place], [place] [address] PCP: Claretta Fraise, MD   Assessment & Plan: Visit Diagnoses:  1. Closed fracture of proximal end of right humerus, unspecified fracture morphology, initial encounter     Plan: We will have him obtain a S[place]iento brace from [employer], prescription is given.  As far as his right elbow we will send him formal physical therapy for range of motion of the elbow.  No physical therapy for the shoulder at this point in time.  See him back in 2 weeks obtain AP and Y view of the right shoulder.  Larry Gates returns today 2 weeks status post right proximal humerus fracture.  He has been using a sling.  He states his swelling pain is dissipating.  Follow-Up Instructions: Return in about 2 weeks (around 09/06/2017) for Radiographs.   Orders:  Orders Placed This Encounter  Procedures  . XR Shoulder Right  . Ambulatory referral to Physical Therapy   No orders of the defined types were placed in this encounter.     Procedures: No procedures performed   Clinical Data: No additional findings.   Subjective: Chief Complaint  Patient presents with  . Right Shoulder - Pain    HPI Larry Gates is now 2 weeks status post proximal right humerus fracture.  He feels overall he is doing better.  He is been doing a few hand exercises.  He is taking nothing for the pain. Review of Systems   Objective: Vital Signs: There were no vitals taken for this visit.  Physical Exam  Constitutional: He is oriented to person, place, and time. He appears well-developed and well-nourished. No distress.  Cardiovascular: Intact distal pulses.  Neurological: He is alert and oriented to person, place, and time.  Skin: He is not diaphoretic.    Ortho Exam Right elbow nontender he has good flexion.  He  has limited extension to lacking at least 25 degrees from full extension.  Able to supinate and pronate the forearm without pain. Specialty Comments:  No specialty comments available.  Imaging: Xr Shoulder Right  Result Date: 08/23/2017 2 views right shoulder show the proximal humerus fracture although slightly is still impacted.  Humeral heads are still well located.  No other fractures identified    PMFS History: Patient Active Problem List   Diagnosis Date Noted  . Closed fracture of right proximal humerus 08/09/2017  . Long-term use of high-risk medication 01/19/2017  . Accelerated hypertension 12/08/2014  . Thyroid activity decreased 12/08/2014  . Essential hypertension 08/09/2012  . Atrial flutter ([place]) 08/30/2010  . Hyperlipidemia 08/30/2010  . Long term current use of anticoagulant therapy 08/24/2010  . Atrial fibrillation, chronic ([place]) 08/17/2010   Past Medical History:  Diagnosis Date  . Anticoagulated on Coumadin    a. Followed @ [facility]  . Atypical atrial flutter ([place])    a. Dx 07/2010 and coumadin initiated;  b. 08/2010 s/p DCCV.  . Cataract   . Hard of hearing   . Midsternal chest pain    a. In setting of A Flutter 07/2010;  b. Cath 07/2010: nonobs dzs  . Prostate cancer ([place])   . TIA (transient ischemic attack)   .  Vertigo     Family History  Problem Relation Age of Onset  . CVA Father   . Alzheimer's disease Father   . Leukemia Father   . Alzheimer's disease Mother   . Heart Problems Brother        stents in his 48's-80's  . Early death Sister   . Early death Brother     Past Surgical History:  Procedure Laterality Date  . APPENDECTOMY    . CARDIOVERSION  10/14/2011   Procedure: CARDIOVERSION;  Surgeon: Lelon Perla, MD;  Location: [place];  Service: Cardiovascular;  Laterality: N/A;  . CARDIOVERSION N/A 07/24/2012   Procedure: CARDIOVERSION;  Surgeon: [name], MD;  Location: [place];  Service: Cardiovascular;  Laterality: N/A;  . EYE  SURGERY Bilateral    cataract  . I&D EXTREMITY Left 05/04/2013   Procedure: IRRIGATION AND DEBRIDEMENT of left hand with repair of lacerations and revision amputation of index finger;  Surgeon: Roseanne Kaufman, MD;  Location: [place];  Service: Orthopedics;  Laterality: Left;  . TONSILLECTOMY     Social History   Occupational History  . Not on file  Tobacco Use  . Smoking status: Never Smoker  . Smokeless tobacco: Never Used  Substance and Sexual Activity  . Alcohol use: No  . Drug use: No  . Sexual activity: Never

## 2017-08-29 ENCOUNTER — Ambulatory Visit: Payer: Medicare Other | Attending: Physician Assistant | Admitting: Physical Therapy

## 2017-08-29 ENCOUNTER — Encounter: Payer: Self-pay | Admitting: Physical Therapy

## 2017-08-29 DIAGNOSIS — M25621 Stiffness of right elbow, not elsewhere classified: Secondary | ICD-10-CM | POA: Insufficient documentation

## 2017-08-29 DIAGNOSIS — R600 Localized edema: Secondary | ICD-10-CM | POA: Diagnosis not present

## 2017-08-29 NOTE — Therapy (Signed)
[facility] [place], [place], [address] Phone: [phone]   Fax:  [phone]  Physical Therapy Evaluation  Patient Details  Name: Larry Gates MRN: [id number] Date of Birth: 07/05/1928 Referring Provider: Erskine Emery MD   Encounter Date: 08/29/2017  PT End of Session - 08/29/17 1348    Visit Number  1    Number of Visits  16    Date for PT Re-Evaluation  11/27/17    Authorization Type  FOTO AT LEAST EVERY 5TH VISIT, 10TH VISIT PROGRESS NOTE AND KX MODIFIER AFTER THE 15 VISIT.    PT Start Time  1030    PT Stop Time  1108    PT Time Calculation (min)  38 min    Activity Tolerance  Patient tolerated treatment well    Behavior During Therapy  WFL for tasks assessed/performed       Past Medical History:  Diagnosis Date  . Anticoagulated on Coumadin    a. Followed @ [facility]  . Atypical atrial flutter ([place])    a. Dx 07/2010 and coumadin initiated;  b. 08/2010 s/p DCCV.  . Cataract   . Hard of hearing   . Midsternal chest pain    a. In setting of A Flutter 07/2010;  b. Cath 07/2010: nonobs dzs  . Prostate cancer ([place])   . TIA (transient ischemic attack)   . Vertigo     Past Surgical History:  Procedure Laterality Date  . APPENDECTOMY    . CARDIOVERSION  10/14/2011   Procedure: CARDIOVERSION;  Surgeon: Lelon Perla, MD;  Location: [facility];  Service: Cardiovascular;  Laterality: N/A;  . CARDIOVERSION N/A 07/24/2012   Procedure: CARDIOVERSION;  Surgeon: [name], MD;  Location: [facility];  Service: Cardiovascular;  Laterality: N/A;  . EYE SURGERY Bilateral    cataract  . I&D EXTREMITY Left 05/04/2013   Procedure: IRRIGATION AND DEBRIDEMENT of left hand with repair of lacerations and revision amputation of index finger;  Surgeon: Roseanne Kaufman, MD;  Location: [facility];  Service: Orthopedics;  Laterality: Left;  . TONSILLECTOMY      There were no vitals filed for this visit.   Subjective Assessment - 08/29/17  1135    Subjective  The patient states he was helping his son put up a ceiling fan while standing on a chair on 08/05/17 and fell and fractured his right proximal humerus.  He has an orthosis donned on his right shoulder and is in a sling.  He has been doing a ball squeeze exercise at home.  His order today is for elbow range of motion and NO ROM to right shoulder.  His pain-level today is a 4/10 that increases with movement.    Patient is accompained by:  Family member Son.    Pertinent History  Heart problems; DM.    Patient Stated Goals  Get back to my normal life.  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    Currently in Pain?  Yes    Pain Score  4     Pain Location  Elbow    Pain Orientation  Right    Pain Descriptors / Indicators  Aching    Pain Onset  1 to 4 weeks ago    Pain Frequency  Intermittent    Aggravating Factors   Movement.    Pain Relieving Factors  Rest.         [facility] PT Assessment - 08/29/17 0001  Assessment   Medical Diagnosis  Closed fracture of proximal end of right humerus.    Referring Provider  Erskine Emery MD    Onset Date/Surgical Date  -- 08/05/17.      Precautions   Precautions  -- NO ROM TO RIGHT SHOULDER.    Required Braces or Orthoses  -- Right shoulder orthosis.      Restrictions   Weight Bearing Restrictions  No      Balance Screen   Has the patient fallen in the past 6 months  Yes    How many times?  -- 1.    Has the patient had a decrease in activity level because of a fear of falling?   No    Is the patient reluctant to leave their home because of a fear of falling?   Yes      [place] residence      Prior Function   Level of Independence  Independent      Observation/Other Assessments   Observations  Left elbow, forearm and hand swollen and ecchymotic.    Focus on Therapeutic Outcomes (FOTO)   76% limitation.      ROM / Strength   AROM / PROM / Strength  AROM      AROM   Overall AROM  Comments  Right wrist flexion and extension= 15 degrees.  Full forearm supination/pronation.  Right elbow flexion= 125 degrees and extension is limited to -46 degrees.      Palpation   Palpation comment  C/o right shoulder pain though his shoulder is protected by an orthosis.      Ambulation/Gait   Gait Comments  WNL.                Objective measurements completed on examination: See above findings.      [facility] Adult PT Treatment/Exercise - 08/29/17 0001      Exercises   Exercises  Elbow      Elbow Exercises   Other elbow exercises  In supine:  Pillow under right elbow and moist heat to right bicep.  Patient performed non-resisted low load long duration stretch into right elbow extension x 10 minutes.             PT Education - 08/29/17 1342    Education provided  Yes    Education Details  HEP.    Person(s) Educated  Patient    Methods  Explanation;Demonstration;Handout    Comprehension  Verbalized understanding;Returned demonstration;Need further instruction       PT Short Term Goals - 08/29/17 1343      PT SHORT TERM GOAL #1   Title  STG's=LTG's.        PT Long Term Goals - 08/29/17 1345      PT LONG TERM GOAL #1   Title  Independent with a HEP.    Time  8    Period  Weeks    Status  New      PT LONG TERM GOAL #2   Title  Full active right elbow extension.    Time  8    Period  Weeks    Status  New             Plan - 08/29/17 1254    Clinical Impression Statement  The patient presents to [facility] s/p right proximal humeral fracture.  He has a loss of right elbow extension.  NO right shoulder range of motion allowed at this  time.  His deficits have significant impaired his functional abilities currently with a FOTO limitation of 76%.  Patient will benefit from skilled physical therapy intervention.    History and Personal Factors relevant to plan of care:  Heart problems; DM.    Clinical Presentation  Evolving    Clinical Decision Making   Low    Rehab Potential  Good    PT Frequency  2x / week    PT Duration  8 weeks    PT Treatment/Interventions  ADLs/Self Care Home Management;Moist Heat;Ultrasound;Electrical Stimulation;Therapeutic activities;Therapeutic exercise;Patient/family education;Manual techniques;Passive range of motion    PT Next Visit Plan  Focus treatment on restoring right elbow extension.  Gentle STW/M may be helpful to right elbow.  Low load long duration stretchng.    Consulted and Agree with Plan of Care  Patient       Patient will benefit from skilled therapeutic intervention in order to improve the following deficits and impairments:  Decreased activity tolerance, Decreased range of motion, Pain  Visit Diagnosis: Stiffness of right elbow, not elsewhere classified - Plan: PT plan of care cert/re-cert  Localized edema - Plan: PT plan of care cert/re-cert     Problem List Patient Active Problem List   Diagnosis Date Noted  . Closed fracture of right proximal humerus 08/09/2017  . Long-term use of high-risk medication 01/19/2017  . Accelerated hypertension 12/08/2014  . Thyroid activity decreased 12/08/2014  . Essential hypertension 08/09/2012  . Atrial flutter ([place]) 08/30/2010  . Hyperlipidemia 08/30/2010  . Long term current use of anticoagulant therapy 08/24/2010  . Atrial fibrillation, chronic ([place]) 08/17/2010    APPLEGATE, [place]i MPT 08/29/2017, 1:49 PM  [facility] [address] [place], [place], [address] Phone: [phone]   Fax:  [phone]  Name: Larry Gates MRN: [id number] Date of Birth: 03/30/1929

## 2017-09-01 ENCOUNTER — Encounter: Payer: Self-pay | Admitting: Pharmacist Clinician (PhC)/ Clinical Pharmacy Specialist

## 2017-09-05 ENCOUNTER — Ambulatory Visit: Payer: Medicare Other | Admitting: *Deleted

## 2017-09-05 DIAGNOSIS — M25621 Stiffness of right elbow, not elsewhere classified: Secondary | ICD-10-CM

## 2017-09-05 DIAGNOSIS — R600 Localized edema: Secondary | ICD-10-CM | POA: Diagnosis not present

## 2017-09-05 NOTE — Therapy (Addendum)
[facility] [place], [place], [address] Phone: [phone]   Fax:  [phone]  Physical Therapy Treatment  Patient Details  Name: Larry Gates MRN: [id number] Date of Birth: 1929-02-18 Referring Provider: Erskine Emery MD   Encounter Date: 09/05/2017  PT End of Session - 09/05/17 0844    Visit Number  2    Number of Visits  16    Date for PT Re-Evaluation  11/27/17    Authorization Type  FOTO AT LEAST EVERY 5TH VISIT, 10TH VISIT PROGRESS NOTE AND KX MODIFIER AFTER THE 15 VISIT.    PT Start Time  0815    PT Stop Time  [phone]    PT Time Calculation (min)  37 min       Past Medical History:  Diagnosis Date  . Anticoagulated on Coumadin    a. Followed @ [facility]  . Atypical atrial flutter ([place])    a. Dx 07/2010 and coumadin initiated;  b. 08/2010 s/p DCCV.  . Cataract   . Hard of hearing   . Midsternal chest pain    a. In setting of A Flutter 07/2010;  b. Cath 07/2010: nonobs dzs  . Prostate cancer ([place])   . TIA (transient ischemic attack)   . Vertigo     Past Surgical History:  Procedure Laterality Date  . APPENDECTOMY    . CARDIOVERSION  10/14/2011   Procedure: CARDIOVERSION;  Surgeon: Lelon Perla, MD;  Location: [facility];  Service: Cardiovascular;  Laterality: N/A;  . CARDIOVERSION N/A 07/24/2012   Procedure: CARDIOVERSION;  Surgeon: [name], MD;  Location: [facility];  Service: Cardiovascular;  Laterality: N/A;  . EYE SURGERY Bilateral    cataract  . I&D EXTREMITY Left 05/04/2013   Procedure: IRRIGATION AND DEBRIDEMENT of left hand with repair of lacerations and revision amputation of index finger;  Surgeon: Roseanne Kaufman, MD;  Location: [facility];  Service: Orthopedics;  Laterality: Left;  . TONSILLECTOMY      There were no vitals filed for this visit.  Subjective Assessment - 09/05/17 0833    Subjective  Did ok after last time. Very little pain    Patient is accompained by:  Family member    Pertinent  History  Heart problems; DM.    Patient Stated Goals  Get back to my normal life.  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    Currently in Pain?  Yes    Pain Score  3     Pain Location  Elbow    Pain Orientation  Right    Pain Descriptors / Indicators  Aching;Sore    Pain Onset  1 to 4 weeks ago                       [facility] Adult PT Treatment/Exercise - 09/05/17 0001      Exercises   Exercises  Elbow      Elbow Exercises   Other elbow exercises  In supine:  Pillow under right elbow and moist heat to right bicep.  Patient performed non-resisted low load long duration stretch into right elbow extension x 10 minutes.      Modalities   Modalities  Moist Heat      Moist Heat Therapy   Number Minutes Moist Heat  10 Minutes    Moist Heat Location  Elbow      Manual Therapy   Manual Therapy  Passive ROM;Soft tissue mobilization  Soft tissue mobilization  light massage for edema at elbow and forearm    Passive ROM  PROM/PAROM to RT elbow/forearm/wrist  in supine with pillow under elbow for support.               PT Short Term Goals - 08/29/17 1343      PT SHORT TERM GOAL #1   Title  STG's=LTG's.        PT Long Term Goals - 08/29/17 1345      PT LONG TERM GOAL #1   Title  Independent with a HEP.    Time  8    Period  Weeks    Status  New      PT LONG TERM GOAL #2   Title  Full active right elbow extension.    Time  8    Period  Weeks    Status  New            Plan - 09/05/17 4496    Clinical Impression Statement  Pt reports he will return to MD tomorrow for F/U. He did very well today without c/o pain during manual PROM/PAROM. His RT elbow extension has progresssed to nearly 0 degrees. -5 today.     Clinical Presentation  Evolving    Rehab Potential  Good    PT Frequency  2x / week    PT Duration  8 weeks    PT Treatment/Interventions  ADLs/Self Care Home Management;Moist Heat;Ultrasound;Electrical Stimulation;Therapeutic  activities;Therapeutic exercise;Patient/family education;Manual techniques;Passive range of motion    PT Next Visit Plan  Focus treatment on restoring right elbow extension.  Gentle STW/M may be helpful to right elbow.  Low load long duration stretchng.       Patient will benefit from skilled therapeutic intervention in order to improve the following deficits and impairments:  Decreased activity tolerance, Decreased range of motion, Pain  Visit Diagnosis: Stiffness of right elbow, not elsewhere classified  Localized edema     Problem List Patient Active Problem List   Diagnosis Date Noted  . Closed fracture of right proximal humerus 08/09/2017  . Long-term use of high-risk medication 01/19/2017  . Accelerated hypertension 12/08/2014  . Thyroid activity decreased 12/08/2014  . Essential hypertension 08/09/2012  . Atrial flutter ([place]) 08/30/2010  . Hyperlipidemia 08/30/2010  . Long term current use of anticoagulant therapy 08/24/2010  . Atrial fibrillation, chronic ([place]) 08/17/2010    Dandrea Widdowson,CHRIS, PTA 09/05/2017, 9:03 AM  [facility] [address] [place], [place], [address] Phone: [phone]   Fax:  [phone]  Name: Larry Gates MRN: [id number] Date of Birth: [birth year]  PHYSICAL THERAPY DISCHARGE SUMMARY  Visits from Start of Care: 2.  Current functional level related to goals / functional outcomes: See above.   Remaining deficits: See below.   Education / Equipment:  Plan: Patient agrees to discharge.  Patient goals were not met. Patient is being discharged due to not returning since the last visit.  ?????         Mali Applegate MPT

## 2017-09-06 ENCOUNTER — Ambulatory Visit (INDEPENDENT_AMBULATORY_CARE_PROVIDER_SITE_OTHER): Payer: Medicare Other | Admitting: Physician Assistant

## 2017-09-06 ENCOUNTER — Encounter (INDEPENDENT_AMBULATORY_CARE_PROVIDER_SITE_OTHER): Payer: Self-pay | Admitting: Physician Assistant

## 2017-09-06 ENCOUNTER — Ambulatory Visit (INDEPENDENT_AMBULATORY_CARE_PROVIDER_SITE_OTHER): Payer: Medicare Other

## 2017-09-06 DIAGNOSIS — M25511 Pain in right shoulder: Secondary | ICD-10-CM

## 2017-09-06 DIAGNOSIS — S42201A Unspecified fracture of upper end of right humerus, initial encounter for closed fracture: Secondary | ICD-10-CM

## 2017-09-06 NOTE — Progress Notes (Signed)
Office Visit Note   Patient: Larry Gates           Date of Birth: Sep 14, 1928           MRN: [id number] Visit Date: 09/06/2017              Requested by: Claretta Fraise, MD [place], [place] [address] PCP: Claretta Fraise, MD   Assessment & Plan: Visit Diagnoses:  1. Right shoulder pain, unspecified chronicity   2. Closed fracture of proximal end of right humerus, unspecified fracture morphology, initial encounter     Plan: He will continue the Sarmiento brace on the right arm.  Continue to work on range of motion of the forearm and elbow wrist and hand.  See him back in 1 month obtain 2 views of the right shoulder.  At that time most likely will advance his range of motion involving the shoulder with physical therapy.  Follow-Up Instructions: Return in about 1 month (around 10/04/2017).   Orders:  Orders Placed This Encounter  Procedures  . XR Shoulder Right   No orders of the defined types were placed in this encounter.     Procedures: No procedures performed   Clinical Data: No additional findings.   Subjective: Chief Complaint  Patient presents with  . Right Shoulder - Follow-up    DOI 08/05/17    HPI Larry Gates returns today for follow-up of his proximal humerus fracture.  He continues to have some numbness in his hand finger but otherwise is doing well.  He has been going to physical therapy and he did obtain a Sarmiento brace.  He really does not wear the sling since he obtain the Sarmiento brace. Review of Systems See HPI otherwise negative  Objective: Vital Signs: There were no vitals taken for this visit.  Physical Exam  Constitutional: He is oriented to person, place, and time. He appears well-developed and well-nourished. No distress.  Pulmonary/Chest: Effort normal.  Neurological: He is alert and oriented to person, place, and time.  Skin: He is not diaphoretic.  Psychiatric: He has a normal mood and affect.    Ortho Exam Right  upper extremity radial pulses intact.  He has good sensation in the fingers.  Good motion of the fingers.  There is no skin breakdown about the shoulder girdle.  He is regained full extension flexion of the elbow and full supination pronation of the forearm. Specialty Comments:  No specialty comments available.  Imaging: Xr Shoulder Right  Result Date: 09/06/2017 2 views right shoulder: Humeral head well located.  Overall improved alignment of the humeral head and shaft.  Early consolidation seen.    PMFS History: Patient Active Problem List   Diagnosis Date Noted  . Closed fracture of right proximal humerus 08/09/2017  . Long-term use of high-risk medication 01/19/2017  . Accelerated hypertension 12/08/2014  . Thyroid activity decreased 12/08/2014  . Essential hypertension 08/09/2012  . Atrial flutter ([place]) 08/30/2010  . Hyperlipidemia 08/30/2010  . Long term current use of anticoagulant therapy 08/24/2010  . Atrial fibrillation, chronic ([place]) 08/17/2010   Past Medical History:  Diagnosis Date  . Anticoagulated on Coumadin    a. Followed @ [facility]  . Atypical atrial flutter ([place])    a. Dx 07/2010 and coumadin initiated;  b. 08/2010 s/p DCCV.  . Cataract   . Hard of hearing   . Midsternal chest pain    a. In setting of A Flutter 07/2010;  b. Cath 07/2010: nonobs dzs  .  Prostate cancer ([place])   . TIA (transient ischemic attack)   . Vertigo     Family History  Problem Relation Age of Onset  . CVA Father   . Alzheimer's disease Father   . Leukemia Father   . Alzheimer's disease Mother   . Heart Problems Brother        stents in his 62's-80's  . Early death Sister   . Early death Brother     Past Surgical History:  Procedure Laterality Date  . APPENDECTOMY    . CARDIOVERSION  10/14/2011   Procedure: CARDIOVERSION;  Surgeon: Lelon Perla, MD;  Location: [place];  Service: Cardiovascular;  Laterality: N/A;  . CARDIOVERSION N/A 07/24/2012   Procedure: CARDIOVERSION;  Surgeon:  [name], MD;  Location: [facility];  Service: Cardiovascular;  Laterality: N/A;  . EYE SURGERY Bilateral    cataract  . I&D EXTREMITY Left 05/04/2013   Procedure: IRRIGATION AND DEBRIDEMENT of left hand with repair of lacerations and revision amputation of index finger;  Surgeon: Roseanne Kaufman, MD;  Location: [place];  Service: Orthopedics;  Laterality: Left;  . TONSILLECTOMY     Social History   Occupational History  . Not on file  Tobacco Use  . Smoking status: Never Smoker  . Smokeless tobacco: Never Used  Substance and Sexual Activity  . Alcohol use: No  . Drug use: No  . Sexual activity: Never

## 2017-09-08 ENCOUNTER — Encounter: Payer: Self-pay | Admitting: *Deleted

## 2017-09-20 ENCOUNTER — Encounter: Payer: Self-pay | Admitting: Pharmacist Clinician (PhC)/ Clinical Pharmacy Specialist

## 2017-09-22 ENCOUNTER — Encounter: Payer: Self-pay | Admitting: Pharmacist Clinician (PhC)/ Clinical Pharmacy Specialist

## 2017-09-29 ENCOUNTER — Encounter: Payer: Self-pay | Admitting: Pharmacist Clinician (PhC)/ Clinical Pharmacy Specialist

## 2017-10-04 ENCOUNTER — Ambulatory Visit: Payer: Medicare Other | Admitting: Family Medicine

## 2017-10-04 ENCOUNTER — Encounter: Payer: Self-pay | Admitting: Family Medicine

## 2017-10-04 VITALS — BP 182/89 | HR 75 | Temp 98.6°F | Ht 66.0 in | Wt 138.0 lb

## 2017-10-04 DIAGNOSIS — E039 Hypothyroidism, unspecified: Secondary | ICD-10-CM | POA: Diagnosis not present

## 2017-10-04 DIAGNOSIS — E782 Mixed hyperlipidemia: Secondary | ICD-10-CM | POA: Diagnosis not present

## 2017-10-04 DIAGNOSIS — I4891 Unspecified atrial fibrillation: Secondary | ICD-10-CM

## 2017-10-04 DIAGNOSIS — I10 Essential (primary) hypertension: Secondary | ICD-10-CM | POA: Diagnosis not present

## 2017-10-04 LAB — COAGUCHEK XS/INR WAIVED
INR: 2 — ABNORMAL HIGH (ref 0.9–1.1)
Prothrombin Time: 24.1 s

## 2017-10-04 NOTE — Progress Notes (Signed)
BP (!) 196/87   Pulse 75   Temp 98.6 F (37 C) (Oral)   Ht 5\' 6"  (1.676 m)   Wt 138 lb (62.6 kg)   BMI 22.27 kg/m    Subjective:    [name], male    DOB: [birth year], 84 y.o.   MRN: [id number]  HPI: Larry Gates is a 84 y.o. male presenting on 10/04/2017 for Hyperlipidemia; Hypertension; Hypothyroidism; and Anticoagulation   HPI Hypertension Patient is currently on amiodarone and cartia, and their blood pressure today is 196/87on initial and 182/89 on second check, patient says he runs here all the time and he sees cardiology for this. Patient denies any lightheadedness or dizziness. Patient denies headaches, blurred vision, chest pains, shortness of breath, or weakness. Denies any side effects from medication and is content with current medication.   Hyperlipidemia Patient is coming in for recheck of his hyperlipidemia. The patient is currently taking no medication because is been intolerant previously. They deny any issues with myalgias or history of liver damage from it. They deny any focal numbness or weakness or chest pain.   Hypothyroidism recheck Patient is coming in for thyroid recheck today as well. They deny any issues with hair changes or heat or cold problems or diarrhea or constipation. They deny any chest pain or palpitations. They are currently on levothyroxine 37micrograms   A. Fib/anticoagulation Patient is coming in for Coumadin recheck for A. fib coagulation, his INR is 2.0.  He denies any bleeding, he has been very stable on his current dose for quite some time.  Recheck in 2 months.  Relevant past medical, surgical, family and social history reviewed and updated as indicated. Interim medical history since our last visit reviewed. Allergies and medications reviewed and updated.  Review of Systems  Constitutional: Negative for chills and fever.  Eyes: Negative for discharge.  Respiratory: Negative for shortness of breath and wheezing.     Cardiovascular: Negative for chest pain, palpitations and leg swelling.  Musculoskeletal: Negative for back pain and gait problem.  Skin: Negative for rash.  Neurological: Negative for dizziness, weakness, light-headedness and numbness.  All other systems reviewed and are negative.   Per HPI unless specifically indicated above   Allergies as of 10/04/2017      Reactions   Penicillins Rash      Medication List        Accurate as of 10/04/17  9:16 AM. Always use your most recent med list.          baclofen 10 MG tablet Commonly known as:  LIORESAL Take 1 tablet (10 mg total) by mouth at bedtime as needed for muscle spasms.   CARTIA XT 120 MG 24 hr capsule Generic drug:  diltiazem TAKE 1 CAPSULE BY MOUTH ONCE DAILY   levothyroxine 88 MCG tablet Commonly known as:  SYNTHROID, LEVOTHROID Take 1 tablet (88 mcg total) by mouth daily before breakfast.   warfarin 2 MG tablet Commonly known as:  COUMADIN Take as directed by the anticoagulation clinic. If you are unsure how to take this medication, talk to your nurse or doctor. Original instructions:  Take 1 tablet (2 mg total) by mouth daily.          Objective:    BP (!) 196/87   Pulse 75   Temp 98.6 F (37 C) (Oral)   Ht 5\' 6"  (1.676 m)   Wt 138 lb (62.6 kg)   BMI 22.27 kg/m   Wt Readings from  Last 3 Encounters:  10/04/17 138 lb (62.6 kg)  08/05/17 136 lb (61.7 kg)  07/28/17 136 lb (61.7 kg)    Physical Exam  Constitutional: He is oriented to person, place, and time. He appears well-developed and well-nourished. No distress.  Patient is currently in a sling on his right arm with cast because he fractured his humerus near shoulder  Eyes: Conjunctivae are normal. No scleral icterus.  Neck: Neck supple. No thyromegaly present.  Cardiovascular: Normal rate, regular rhythm, normal heart sounds and intact distal pulses.  No murmur heard. Pulmonary/Chest: Effort normal and breath sounds normal. No respiratory  distress. He has no wheezes.  Lymphadenopathy:    He has no cervical adenopathy.  Neurological: He is alert and oriented to person, place, and time. Coordination normal.  Skin: Skin is warm and dry. No rash noted. He is not diaphoretic.  Psychiatric: He has a normal mood and affect. His behavior is normal.  Nursing note and vitals reviewed.   Description   Continue taking warfarin the same way  INR 2.0 (Goal is between 2-3)  Perfect reading          Assessment & Plan:   Problem List Items Addressed This Visit      Cardiovascular and Mediastinum   Essential hypertension - Primary   Accelerated hypertension     Endocrine   Thyroid activity decreased   Relevant Orders   TSH     Other   Hyperlipidemia    Other Visit Diagnoses    Atrial fibrillation, unspecified type ([place])       Relevant Orders   CBC with Differential/Platelet     Continue medication, continue rechecks of INR, come back in 2 months for INR recheck, come back in 6 months for standard labs  Follow up plan: Return in about 6 months (around 04/05/2018), or if symptoms worsen or fail to improve, for Hypertension and and cholesterol recheck.  Counseling provided for all of the vaccine components Orders Placed This Encounter  Procedures  . TSH  . CBC with Differential/Platelet    Caryl Pina, MD [facility] 10/04/2017, 9:16 AM

## 2017-10-04 NOTE — Patient Instructions (Signed)
Description   Continue taking warfarin the same way  INR 2.0 (Goal is between 2-3)  Perfect reading

## 2017-10-09 ENCOUNTER — Ambulatory Visit (INDEPENDENT_AMBULATORY_CARE_PROVIDER_SITE_OTHER): Payer: Medicare Other | Admitting: Physician Assistant

## 2017-10-09 ENCOUNTER — Ambulatory Visit (INDEPENDENT_AMBULATORY_CARE_PROVIDER_SITE_OTHER): Payer: Medicare Other

## 2017-10-09 ENCOUNTER — Encounter (INDEPENDENT_AMBULATORY_CARE_PROVIDER_SITE_OTHER): Payer: Self-pay | Admitting: Physician Assistant

## 2017-10-09 DIAGNOSIS — S42201D Unspecified fracture of upper end of right humerus, subsequent encounter for fracture with routine healing: Secondary | ICD-10-CM | POA: Diagnosis not present

## 2017-10-09 NOTE — Progress Notes (Signed)
Office Visit Note   Patient: Larry Gates           Date of Birth: [birth year]           MRN: [id number] Visit Date: 10/09/2017              Requested by: Claretta Fraise, MD [place], [place] [address] PCP: Claretta Fraise, MD   Assessment & Plan: Visit Diagnoses:  1. Closed fracture of proximal end of right humerus with routine healing, unspecified fracture morphology, subsequent encounter     Plan: Have him follow-up with [place] in 1 month.  In the interim he will work on range of motion particularly overhead motion.  No heavy lifting with the right arm.  3 views of the right shoulder at that time.  Questions were encouraged and answered  Follow-Up Instructions: Return in about 1 month (around 11/06/2017).   Orders:  Orders Placed This Encounter  Procedures  . XR Shoulder Right   No orders of the defined types were placed in this encounter.     Procedures: No procedures performed   Clinical Data: No additional findings.   Subjective: Chief Complaint  Patient presents with  . Right Shoulder - Follow-up    HPI Larry Gates returns now 9 weeks status post right proximal humerus fracture.  He is overall doing well.  He has been wearing the Sarmiento brace.  He is having some numbness in his first second and third fingers.  Otherwise no complaints. Review of Systems   Objective: Vital Signs: There were no vitals taken for this visit.  Physical Exam  Constitutional: He is oriented to person, place, and time. He appears well-developed and well-nourished. No distress.  Pulmonary/Chest: Effort normal.  Neurological: He is alert and oriented to person, place, and time.  Skin: He is not diaphoretic.    Ortho Exam Right shoulder he has limited abduction and forward flexion.  Impingement testing negative.  Full range of motion of the right  elbow and forearm without pain. Specialty Comments:  No specialty comments available.  Imaging: Xr Shoulder  Right  Result Date: 10/09/2017 2 views right shoulder: Abundant callus formation.  Overall right humeral head is well located in overall good position alignment.    PMFS History: Patient Active Problem List   Diagnosis Date Noted  . Closed fracture of right proximal humerus 08/09/2017  . Long-term use of high-risk medication 01/19/2017  . Accelerated hypertension 12/08/2014  . Thyroid activity decreased 12/08/2014  . Essential hypertension 08/09/2012  . Atrial flutter ([place]) 08/30/2010  . Hyperlipidemia 08/30/2010  . Long term current use of anticoagulant therapy 08/24/2010  . Atrial fibrillation, chronic ([place]) 08/17/2010   Past Medical History:  Diagnosis Date  . Anticoagulated on Coumadin    a. Followed @ [facility]  . Atypical atrial flutter ([place])    a. Dx 07/2010 and coumadin initiated;  b. 08/2010 s/p DCCV.  . Cataract   . Hard of hearing   . Midsternal chest pain    a. In setting of A Flutter 07/2010;  b. Cath 07/2010: nonobs dzs  . Prostate cancer ([place])   . TIA (transient ischemic attack)   . Vertigo     Family History  Problem Relation Age of Onset  . CVA Father   . Alzheimer's disease Father   . Leukemia Father   . Alzheimer's disease Mother   . Heart Problems Brother        stents in his 83's-80's  . Early  death Sister   . Early death Brother     Past Surgical History:  Procedure Laterality Date  . APPENDECTOMY    . CARDIOVERSION  10/14/2011   Procedure: CARDIOVERSION;  Surgeon: Lelon Perla, MD;  Location: [facility];  Service: Cardiovascular;  Laterality: N/A;  . CARDIOVERSION N/A 07/24/2012   Procedure: CARDIOVERSION;  Surgeon: [name], MD;  Location: [facility];  Service: Cardiovascular;  Laterality: N/A;  . EYE SURGERY Bilateral    cataract  . I&D EXTREMITY Left 05/04/2013   Procedure: IRRIGATION AND DEBRIDEMENT of left hand with repair of lacerations and revision amputation of index finger;  Surgeon: Roseanne Kaufman, MD;  Location: [facility];  Service:  Orthopedics;  Laterality: Left;  . TONSILLECTOMY     Social History   Occupational History  . Not on file  Tobacco Use  . Smoking status: Never Smoker  . Smokeless tobacco: Never Used  Substance and Sexual Activity  . Alcohol use: No  . Drug use: No  . Sexual activity: Never

## 2017-10-27 ENCOUNTER — Encounter: Payer: Self-pay | Admitting: Pharmacist Clinician (PhC)/ Clinical Pharmacy Specialist

## 2017-11-06 ENCOUNTER — Telehealth (INDEPENDENT_AMBULATORY_CARE_PROVIDER_SITE_OTHER): Payer: Self-pay | Admitting: Physician Assistant

## 2017-11-06 ENCOUNTER — Ambulatory Visit (INDEPENDENT_AMBULATORY_CARE_PROVIDER_SITE_OTHER): Payer: Medicare Other

## 2017-11-06 ENCOUNTER — Ambulatory Visit (INDEPENDENT_AMBULATORY_CARE_PROVIDER_SITE_OTHER): Payer: Medicare Other | Admitting: Physician Assistant

## 2017-11-06 ENCOUNTER — Encounter (INDEPENDENT_AMBULATORY_CARE_PROVIDER_SITE_OTHER): Payer: Self-pay | Admitting: Physician Assistant

## 2017-11-06 DIAGNOSIS — S42201D Unspecified fracture of upper end of right humerus, subsequent encounter for fracture with routine healing: Secondary | ICD-10-CM | POA: Diagnosis not present

## 2017-11-06 DIAGNOSIS — M542 Cervicalgia: Secondary | ICD-10-CM

## 2017-11-06 MED ORDER — METHYLPREDNISOLONE 2 MG PO TABS: ORAL_TABLET | ORAL | 0 refills | Status: DC

## 2017-11-06 NOTE — Progress Notes (Signed)
Office Visit Note   Patient: Larry Gates           Date of Birth: 20-Jan-1929           MRN: [id number] Visit Date: 11/06/2017              Requested by: Claretta Fraise, MD [place], [place] [address] PCP: Claretta Fraise, MD   Assessment & Plan: Visit Diagnoses:  1. Closed fracture of proximal end of right humerus with routine healing, unspecified fracture morphology, subsequent encounter   2. Neck pain     Plan: We will send to physical therapy for his shoulder to work on range of motion and strengthening.  Regards to his neck we will also send in therapy for range of motion modalities to his neck.  If his pain does not resolve or he does not get his motion back of the shoulder he will return.  Otherwise follow-up PRN.  Placed on a Medrol Dosepak low-dose for 6 days.  Follow-Up Instructions: Return if symptoms worsen or fail to improve.   Orders:  Orders Placed This Encounter  Procedures  . XR Shoulder Right  . Ambulatory referral to Physical Therapy   Meds ordered this encounter  Medications  . methylPREDNISolone (MEDROL) 2 MG tablet    Sig: Take as directed    Dispense:  21 tablet    Refill:  0      Procedures: No procedures performed   Clinical Data: No additional findings.   Subjective: Chief Complaint  Patient presents with  . Right Shoulder - Pain, Follow-up  . Neck - Pain, Follow-up    HPI Mr. Hlavacek returns today follow-up of his right proximal humerus fracture.  He is now proximally 3 months status post injury.  States the shoulder does not bother him much.  He is mainly concerned about the numbness knees having in his hand involving 3 fingers in his neck pain.  He did have a CT scan at the time of injury to his right shoulder.  This showed grade 1 spinal listhesis at C3/C4.  No other bony abnormalities or acute findings. Review of Systems   Objective: Vital Signs: There were no vitals taken for this visit.  Physical Exam    Constitutional: He is oriented to person, place, and time. He appears well-developed and well-nourished. No distress.  Pulmonary/Chest: Effort normal.  Neurological: He is alert and oriented to person, place, and time.  Skin: He is not diaphoretic.  Psychiatric: He has a normal mood and affect.    Ortho Exam Negative Tinel's and compression test at the wrist.  Subjective decreased sensation of right thumb index and middle finger.  Otherwise sensation and motor intact bilateral hands.  Has good range of motion of the cervical spine with discomfort only with extension.  Right shoulder active forward flexion 90 degrees and bring him to about 100 to 110 degrees.  Specialty Comments:  No specialty comments available.  Imaging: Xr Shoulder Right  Result Date: 11/06/2017 Right shoulder: 3 views show the shoulder be well located.  No change in overall position alignment from previous films.  Further consolidation of the proximal humeral shaft fracture.    PMFS History: Patient Active Problem List   Diagnosis Date Noted  . Closed fracture of right proximal humerus 08/09/2017  . Long-term use of high-risk medication 01/19/2017  . Accelerated hypertension 12/08/2014  . Thyroid activity decreased 12/08/2014  . Essential hypertension 08/09/2012  . Atrial flutter ([place]) 08/30/2010  .  Hyperlipidemia 08/30/2010  . Long term current use of anticoagulant therapy 08/24/2010  . Atrial fibrillation, chronic ([facility]) 08/17/2010   Past Medical History:  Diagnosis Date  . Anticoagulated on Coumadin    a. Followed @ [facility]  . Atypical atrial flutter ([place])    a. Dx 07/2010 and coumadin initiated;  b. 08/2010 s/p DCCV.  . Cataract   . Hard of hearing   . Midsternal chest pain    a. In setting of A Flutter 07/2010;  b. Cath 07/2010: nonobs dzs  . Prostate cancer ([facility])   . TIA (transient ischemic attack)   . Vertigo     Family History  Problem Relation Age of Onset  . CVA Father   . Alzheimer's disease  Father   . Leukemia Father   . Alzheimer's disease Mother   . Heart Problems Brother        stents in his 77's-80's  . Early death Sister   . Early death Brother     Past Surgical History:  Procedure Laterality Date  . APPENDECTOMY    . CARDIOVERSION  10/14/2011   Procedure: CARDIOVERSION;  Surgeon: Lelon Perla, MD;  Location: [facility];  Service: Cardiovascular;  Laterality: N/A;  . CARDIOVERSION N/A 07/24/2012   Procedure: CARDIOVERSION;  Surgeon: [name], MD;  Location: [facility];  Service: Cardiovascular;  Laterality: N/A;  . EYE SURGERY Bilateral    cataract  . I&D EXTREMITY Left 05/04/2013   Procedure: IRRIGATION AND DEBRIDEMENT of left hand with repair of lacerations and revision amputation of index finger;  Surgeon: Roseanne Kaufman, MD;  Location: [facility];  Service: Orthopedics;  Laterality: Left;  . TONSILLECTOMY     Social History   Occupational History  . Not on file  Tobacco Use  . Smoking status: Never Smoker  . Smokeless tobacco: Never Used  Substance and Sexual Activity  . Alcohol use: No  . Drug use: No  . Sexual activity: Never

## 2017-11-06 NOTE — Telephone Encounter (Signed)
4mg  not 2mg 

## 2017-11-06 NOTE — Telephone Encounter (Signed)
Tiffany with [facility] called needing clarification on Rx Methylpredeisolone (Medrol) The number to contact Tiffany is [phone]

## 2017-11-14 ENCOUNTER — Other Ambulatory Visit: Payer: Self-pay

## 2017-11-14 ENCOUNTER — Encounter: Payer: Self-pay | Admitting: Physical Therapy

## 2017-11-14 ENCOUNTER — Ambulatory Visit: Payer: Medicare Other | Attending: Physician Assistant | Admitting: Physical Therapy

## 2017-11-14 DIAGNOSIS — M542 Cervicalgia: Secondary | ICD-10-CM | POA: Insufficient documentation

## 2017-11-14 DIAGNOSIS — M6281 Muscle weakness (generalized): Secondary | ICD-10-CM

## 2017-11-14 DIAGNOSIS — M25631 Stiffness of right wrist, not elsewhere classified: Secondary | ICD-10-CM | POA: Diagnosis not present

## 2017-11-14 DIAGNOSIS — M25611 Stiffness of right shoulder, not elsewhere classified: Secondary | ICD-10-CM

## 2017-11-14 NOTE — Therapy (Signed)
[facility] [place], [place], [address] Phone: [phone]   Fax:  [phone]  Physical Therapy Evaluation  Patient Details  Name: Larry Gates MRN: [id number] Date of Birth: 1928-07-09 Referring Provider: Erskine Emery MD   Encounter Date: 11/14/2017  PT End of Session - 11/14/17 1122    Visit Number  1    Number of Visits  16    Date for PT Re-Evaluation  01/09/18    Authorization Type  FOTO AT LEAST EVERY 5TH VISIT, 10TH VISIT PROGRESS NOTE AND KX MODIFIER AFTER THE 15 VISIT.    PT Start Time  1123    PT Stop Time  1201    PT Time Calculation (min)  38 min    Activity Tolerance  Patient tolerated treatment well    Behavior During Therapy  WFL for tasks assessed/performed       Past Medical History:  Diagnosis Date  . Anticoagulated on Coumadin    a. Followed @ [facility]  . Atypical atrial flutter ([place])    a. Dx 07/2010 and coumadin initiated;  b. 08/2010 s/p DCCV.  . Cataract   . Hard of hearing   . Midsternal chest pain    a. In setting of A Flutter 07/2010;  b. Cath 07/2010: nonobs dzs  . Prostate cancer ([place])   . TIA (transient ischemic attack)   . Vertigo     Past Surgical History:  Procedure Laterality Date  . APPENDECTOMY    . CARDIOVERSION  10/14/2011   Procedure: CARDIOVERSION;  Surgeon: Lelon Perla, MD;  Location: [facility];  Service: Cardiovascular;  Laterality: N/A;  . CARDIOVERSION N/A 07/24/2012   Procedure: CARDIOVERSION;  Surgeon: [name], MD;  Location: [facility];  Service: Cardiovascular;  Laterality: N/A;  . EYE SURGERY Bilateral    cataract  . I&D EXTREMITY Left 05/04/2013   Procedure: IRRIGATION AND DEBRIDEMENT of left hand with repair of lacerations and revision amputation of index finger;  Surgeon: Roseanne Kaufman, MD;  Location: [facility];  Service: Orthopedics;  Laterality: Left;  . TONSILLECTOMY      There were no vitals filed for this visit.   Subjective Assessment - 11/14/17  1135    Subjective  Patient hit his head and broke his humerus in a fall around Easter. Since that time he has pain in his neck with extension from a flexed position. He also c/o numbness in digits 1-3 of right hand.     Pertinent History  Heart problems; thryroid    Patient Stated Goals  get rid of neck pain and hand numbness    Currently in Pain?  Yes    Pain Score  5     Pain Location  Neck    Pain Orientation  Right;Left    Pain Descriptors / Indicators  Sore    Pain Type  Chronic pain    Pain Onset  More than a month ago    Aggravating Factors   ext from flexion, end range rotation    Pain Relieving Factors  limited range         [facility] PT Assessment - 11/14/17 0001      Assessment   Medical Diagnosis  neck/shoulde pain s/p R humeral fx    Referring Provider  Erskine Emery MD    Onset Date/Surgical Date  08/05/17    Hand Dominance  Right      Balance Screen   Has the patient fallen in the past 6 months  Yes    How many times?  1    Has the patient had a decrease in activity level because of a fear of falling?   No    Is the patient reluctant to leave their home because of a fear of falling?   No      Home Social worker  Private residence    Living Arrangements  Alone      Prior Function   Level of Independence  Independent    Vocation Requirements  farm/garden      Observation/Other Assessments   Focus on Therapeutic Outcomes (FOTO)   39% limited      Posture/Postural Control   Posture/Postural Control  Postural limitations    Postural Limitations  Rounded Shoulders;Forward head;Increased thoracic kyphosis;Posterior pelvic tilt      ROM / Strength   AROM / PROM / Strength  AROM;PROM;Strength      AROM   Overall AROM Comments  R wrist flex 40 deg, ext 30 deg    AROM Assessment Site  Cervical;Shoulder    Right/Left Shoulder  Right    Right Shoulder Extension  -- full    Right Shoulder Flexion  90 Degrees    Right Shoulder ABduction  75  Degrees    Right Shoulder Internal Rotation  -- [facility]    Right Shoulder External Rotation  23 Degrees    Cervical Flexion  full    Cervical Extension  23    Cervical - Right Side Bend  WFL    Cervical - Left Side Bend  WFL    Cervical - Right Rotation  41    Cervical - Left Rotation  40      PROM   Overall PROM Comments  R shoulder flex 110, ABD 88, ER 38 deg, IR       Strength   Overall Strength Comments  Neck flex/ext 5/5; SB bil 4/5; R shoulder grossly 4-4+/5      Palpation   Palpation comment  no increased muscle tone in neck or RUE; unremarkable with palpation                Objective measurements completed on examination: See above findings.              PT Education - 11/14/17 1208    Education provided  Yes    Education Details  HEP    Person(s) Educated  Patient    Methods  Explanation;Demonstration;Handout;Verbal cues    Comprehension  Verbalized understanding;Returned demonstration       PT Short Term Goals - 11/14/17 1214      PT SHORT TERM GOAL #1   Title  STG's=LTG's.        PT Long Term Goals - 11/14/17 1215      PT LONG TERM GOAL #1   Title  Independent with a HEP for postural and shoulder strengthening    Time  8    Period  Weeks    Status  New    Target Date  01/09/18      PT LONG TERM GOAL #2   Title  decreased pain in neck with extension to <= 1/10.    Time  8    Period  Weeks    Status  New      PT LONG TERM GOAL #3   Title  Improved R shoulder flex to 100 degrees or more to improve function.    Time  8    Period  Weeks    Status  New      PT LONG TERM GOAL #4   Title  Decreased numbness in R hand by 50% to help with ADLS    Time  8    Period  Weeks    Status  New             Plan - 11/14/17 1208    Clinical Impression Statement  Patient presents for low complexity evaluation for neck pain s/p R humeral fracture on 08/05/17. He has pain mainly with neck extension from flexed position. He is limited with  cervical rotation and his R shoulder still has significant ROM deficits affecting ADLS including anything above shoulder height. He also has weakness in his neck and R shoulder. He c/o R hand numbness in digit 1-3 as well.     History and Personal Factors relevant to plan of care:  heart problems, hearing pbs    Clinical Presentation  Stable    Clinical Decision Making  Low    Rehab Potential  Good    PT Frequency  2x / week    PT Duration  8 weeks    PT Treatment/Interventions  ADLs/Self Care Home Management;Moist Heat;Ultrasound;Electrical Stimulation;Therapeutic activities;Therapeutic exercise;Patient/family education;Manual techniques;Passive range of motion;Neuromuscular re-education;Dry needling    PT Next Visit Plan  Focus on postural correction, cervical stabilization, R shoulder ROM/strength; STW to subocciptials and paraspinals prn; passiver R wrist stretching; modalities prn for pain in cspine.    PT Home Exercise Plan  cervical retraction in seated position into pillow or recliner and scapular retraction.    Consulted and Agree with Plan of Care  Patient       Patient will benefit from skilled therapeutic intervention in order to improve the following deficits and impairments:  Pain, Decreased strength, Impaired UE functional use, Decreased range of motion, Postural dysfunction  Visit Diagnosis: Cervicalgia - Plan: PT plan of care cert/re-cert  Stiffness of right shoulder, not elsewhere classified - Plan: PT plan of care cert/re-cert  Muscle weakness (generalized) - Plan: PT plan of care cert/re-cert  Stiffness of right wrist, not elsewhere classified - Plan: PT plan of care cert/re-cert     Problem List Patient Active Problem List   Diagnosis Date Noted  . Closed fracture of right proximal humerus 08/09/2017  . Long-term use of high-risk medication 01/19/2017  . Accelerated hypertension 12/08/2014  . Thyroid activity decreased 12/08/2014  . Essential hypertension  08/09/2012  . Atrial flutter ([place]) 08/30/2010  . Hyperlipidemia 08/30/2010  . Long term current use of anticoagulant therapy 08/24/2010  . Atrial fibrillation, chronic ([place]) 08/17/2010    Edward Trevino PT 11/14/2017, 12:23 PM  [facility] [address] [place], [place], [address] Phone: [phone]   Fax:  [phone]  Name: Larry Gates MRN: [id number] Date of Birth: 1928/10/05

## 2017-11-14 NOTE — Patient Instructions (Signed)
Scapular Retraction (Standing)   With arms at sides, pinch shoulder blades together. Repeat 10 times per set. Do 1-3 sets per session. Do 2 sessions per day.  http://orth.exer.us/944     Flexibility: Neck Retraction   Pull head straight back, keeping eyes and jaw level. Hold 3-5 seconds. Repeat _10 times per set. Do 3-5  sessions per day.  http://orth.exer.us/344   Posture - Sitting   Sit upright, head facing forward. Try using a roll to support lower back. Keep shoulders relaxed, and avoid rounded back. Keep hips level with knees. Avoid crossing legs for long periods.   Madelyn Flavors, PT 11/14/17 12:00 PM; [facility] [place], [place], [address] Phone: [phone]   Fax:  [phone]

## 2017-11-16 ENCOUNTER — Encounter: Payer: Self-pay | Admitting: Physical Therapy

## 2017-11-16 ENCOUNTER — Ambulatory Visit: Payer: Medicare Other | Attending: Physician Assistant | Admitting: Physical Therapy

## 2017-11-16 DIAGNOSIS — M25631 Stiffness of right wrist, not elsewhere classified: Secondary | ICD-10-CM | POA: Insufficient documentation

## 2017-11-16 DIAGNOSIS — M542 Cervicalgia: Secondary | ICD-10-CM | POA: Diagnosis not present

## 2017-11-16 DIAGNOSIS — M25611 Stiffness of right shoulder, not elsewhere classified: Secondary | ICD-10-CM | POA: Diagnosis not present

## 2017-11-16 DIAGNOSIS — M6281 Muscle weakness (generalized): Secondary | ICD-10-CM

## 2017-11-16 NOTE — Therapy (Signed)
[facility] [place], [place], [address] Phone: [phone]   Fax:  [phone]  Physical Therapy Treatment  Patient Details  Name: Larry Gates MRN: [id number] Date of Birth: [birth year] Referring Provider: Erskine Emery MD   Encounter Date: 11/16/2017  PT End of Session - 11/16/17 0859    Visit Number  2    Number of Visits  16    Date for PT Re-Evaluation  01/09/18    Authorization Type  FOTO AT LEAST EVERY 5TH VISIT, 10TH VISIT PROGRESS NOTE AND KX MODIFIER AFTER THE 15 VISIT.    PT Start Time  [phone]    PT Stop Time  0909    PT Time Calculation (min)  53 min    Activity Tolerance  Patient tolerated treatment well    Behavior During Therapy  [facility] for tasks assessed/performed       Past Medical History:  Diagnosis Date  . Anticoagulated on Coumadin    a. Followed @ [facility]  . Atypical atrial flutter ([place])    a. Dx 07/2010 and coumadin initiated;  b. 08/2010 s/p DCCV.  . Cataract   . Hard of hearing   . Midsternal chest pain    a. In setting of A Flutter 07/2010;  b. Cath 07/2010: nonobs dzs  . Prostate cancer ([place])   . TIA (transient ischemic attack)   . Vertigo     Past Surgical History:  Procedure Laterality Date  . APPENDECTOMY    . CARDIOVERSION  10/14/2011   Procedure: CARDIOVERSION;  Surgeon: Lelon Perla, MD;  Location: [place];  Service: Cardiovascular;  Laterality: N/A;  . CARDIOVERSION N/A 07/24/2012   Procedure: CARDIOVERSION;  Surgeon: [name], MD;  Location: [place];  Service: Cardiovascular;  Laterality: N/A;  . EYE SURGERY Bilateral    cataract  . I&D EXTREMITY Left 05/04/2013   Procedure: IRRIGATION AND DEBRIDEMENT of left hand with repair of lacerations and revision amputation of index finger;  Surgeon: Roseanne Kaufman, MD;  Location: [facility];  Service: Orthopedics;  Laterality: Left;  . TONSILLECTOMY      There were no vitals filed for this visit.  Subjective Assessment - 11/16/17 0821     Subjective  Patient arrived with some soreness in neck and stiffness in shoulder    Patient is accompained by:  Family member    Pertinent History  Heart problems; thryroid    Patient Stated Goals  get rid of neck pain and hand numbness    Currently in Pain?  Yes    Pain Score  5     Pain Location  Neck    Pain Orientation  Right;Left    Pain Descriptors / Indicators  Sore    Pain Type  Chronic pain    Pain Onset  More than a month ago    Pain Frequency  Intermittent    Aggravating Factors   flexion of neck    Pain Relieving Factors  neutral neck position                       [facility] Adult PT Treatment/Exercise - 11/16/17 0001      Exercises   Exercises  Neck;Shoulder      Neck Exercises: Standing   Neck Retraction  20 reps    Neck Retraction Limitations  with horiz abd using yellow t-band      Neck Exercises: Seated   Neck Retraction  20 reps;10 reps;5 secs;Limitations  Neck Retraction Limitations  educational cues required      Shoulder Exercises: Seated   Retraction  Strengthening;Both;20 reps;Limitations    Retraction Limitations  educational cues required      Shoulder Exercises: Standing   Retraction  Strengthening;Both;20 reps;10 reps    Retraction Limitations  using grey ball in scap area for resistance      Shoulder Exercises: Pulleys   Flexion  5 minutes      Modalities   Modalities  Electrical Stimulation;Moist Heat      Moist Heat Therapy   Number Minutes Moist Heat  15 Minutes    Moist Heat Location  Cervical      Electrical Stimulation   Electrical Stimulation Location  cervical    Electrical Stimulation Action  IFC    Electrical Stimulation Parameters  80-150hz  x9min    Electrical Stimulation Goals  Pain      Manual Therapy   Manual Therapy  Soft tissue mobilization;Myofascial release    Manual therapy comments  gentle manual STW/MFR to bil cervial spinals and UT area to reduce pain and tone               PT Short  Term Goals - 11/14/17 1214      PT SHORT TERM GOAL #1   Title  STG's=LTG's.        PT Long Term Goals - 11/16/17 0905      PT LONG TERM GOAL #1   Title  Independent with a HEP for postural and shoulder strengthening    Time  8    Period  Weeks    Status  On-going      PT LONG TERM GOAL #2   Title  decreased pain in neck with extension to <= 1/10.    Time  8    Period  Weeks    Status  On-going      PT LONG TERM GOAL #3   Title  Improved R shoulder flex to 100 degrees or more to improve function.    Time  8    Period  Weeks    Status  On-going      PT LONG TERM GOAL #4   Title  Decreased numbness in R hand by 50% to help with ADLS    Time  8    Period  Weeks    Status  On-going            Plan - 11/16/17 0902    Clinical Impression Statement  Patient tolerated treatment well today. Patient has forward flexed posture and today educated him on upright posture and with gentle exercises to help correct upright posture. Patient able to compete all activities today. Patient has most discomfort in cervical area and just stiffness in shoulder. Patient reported doing cane exercises at home for shoulder. Goals ongoing.    Rehab Potential  Good    PT Frequency  2x / week    PT Duration  8 weeks    PT Treatment/Interventions  ADLs/Self Care Home Management;Moist Heat;Ultrasound;Electrical Stimulation;Therapeutic activities;Therapeutic exercise;Patient/family education;Manual techniques;Passive range of motion;Neuromuscular re-education;Dry needling    PT Next Visit Plan  Focus on postural correction, cervical stabilization, R shoulder ROM/strength; STW to subocciptials and paraspinals prn; passiver R wrist stretching; modalities prn for pain in cspine.    Consulted and Agree with Plan of Care  Patient       Patient will benefit from skilled therapeutic intervention in order to improve the following deficits and impairments:  Pain,  Decreased strength, Impaired UE functional use,  Decreased range of motion, Postural dysfunction  Visit Diagnosis: Cervicalgia  Stiffness of right shoulder, not elsewhere classified  Muscle weakness (generalized)  Stiffness of right wrist, not elsewhere classified     Problem List Patient Active Problem List   Diagnosis Date Noted  . Closed fracture of right proximal humerus 08/09/2017  . Long-term use of high-risk medication 01/19/2017  . Accelerated hypertension 12/08/2014  . Thyroid activity decreased 12/08/2014  . Essential hypertension 08/09/2012  . Atrial flutter ([place]) 08/30/2010  . Hyperlipidemia 08/30/2010  . Long term current use of anticoagulant therapy 08/24/2010  . Atrial fibrillation, chronic ([place]) 08/17/2010    Samamtha Tiegs P, PTA 11/16/2017, 9:12 AM  [facility] [place], [place], [address] Phone: [phone]   Fax:  [phone]  Name: Larry Gates MRN: [id number] Date of Birth: 1928/04/26

## 2017-11-20 ENCOUNTER — Encounter: Payer: Self-pay | Admitting: Physical Therapy

## 2017-11-20 ENCOUNTER — Ambulatory Visit: Payer: Medicare Other | Admitting: Physical Therapy

## 2017-11-20 DIAGNOSIS — M542 Cervicalgia: Secondary | ICD-10-CM | POA: Diagnosis not present

## 2017-11-20 DIAGNOSIS — M25631 Stiffness of right wrist, not elsewhere classified: Secondary | ICD-10-CM | POA: Diagnosis not present

## 2017-11-20 DIAGNOSIS — M25611 Stiffness of right shoulder, not elsewhere classified: Secondary | ICD-10-CM

## 2017-11-20 DIAGNOSIS — M6281 Muscle weakness (generalized): Secondary | ICD-10-CM

## 2017-11-20 NOTE — Therapy (Addendum)
[facility] [place], [place], [address] Phone: [phone]   Fax:  [phone]  Physical Therapy Treatment  Patient Details  Name: Larry Gates MRN: [id number] Date of Birth: 03/09/1929 Referring Provider: Erskine Emery MD   Encounter Date: 11/20/2017  PT End of Session - 11/20/17 3151    Visit Number  3    Number of Visits  16    Date for PT Re-Evaluation  01/09/18    Authorization Type  FOTO AT LEAST EVERY 5TH VISIT, 10TH VISIT PROGRESS NOTE AND KX MODIFIER AFTER THE 15 VISIT.    PT Start Time  0815    PT Stop Time  0909    PT Time Calculation (min)  54 min    Activity Tolerance  Patient tolerated treatment well    Behavior During Therapy  [facility] for tasks assessed/performed       Past Medical History:  Diagnosis Date  . Anticoagulated on Coumadin    a. Followed @ [facility]  . Atypical atrial flutter ([place])    a. Dx 07/2010 and coumadin initiated;  b. 08/2010 s/p DCCV.  . Cataract   . Hard of hearing   . Midsternal chest pain    a. In setting of A Flutter 07/2010;  b. Cath 07/2010: nonobs dzs  . Prostate cancer ([place])   . TIA (transient ischemic attack)   . Vertigo     Past Surgical History:  Procedure Laterality Date  . APPENDECTOMY    . CARDIOVERSION  10/14/2011   Procedure: CARDIOVERSION;  Surgeon: Lelon Perla, MD;  Location: [place];  Service: Cardiovascular;  Laterality: N/A;  . CARDIOVERSION N/A 07/24/2012   Procedure: CARDIOVERSION;  Surgeon: [name], MD;  Location: [place];  Service: Cardiovascular;  Laterality: N/A;  . EYE SURGERY Bilateral    cataract  . I&D EXTREMITY Left 05/04/2013   Procedure: IRRIGATION AND DEBRIDEMENT of left hand with repair of lacerations and revision amputation of index finger;  Surgeon: Roseanne Kaufman, MD;  Location: [place];  Service: Orthopedics;  Laterality: Left;  . TONSILLECTOMY      There were no vitals filed for this visit.  Subjective Assessment - 11/20/17 0816     Subjective  Patient arrived with some soreness in neck and stiffness in shoulder, did good after last treatment    Pertinent History  Heart problems; thryroid    Patient Stated Goals  get rid of neck pain and hand numbness    Currently in Pain?  Yes    Pain Score  5     Pain Location  Neck    Pain Orientation  Right;Left    Pain Descriptors / Indicators  Discomfort    Pain Type  Chronic pain    Pain Onset  More than a month ago    Pain Frequency  Intermittent    Aggravating Factors   flexion of neck    Pain Relieving Factors  neutral nech position                       [facility] Adult PT Treatment/Exercise - 11/20/17 0001      Neck Exercises: Standing   Neck Retraction  20 reps    Neck Retraction Limitations  with horiz abd using yellow t-band    Wall Push Ups  20 reps      Shoulder Exercises: Seated   Retraction  Strengthening;Both;20 reps;Limitations    Retraction Limitations  educational cues required  Shoulder Exercises: Standing   Retraction  Strengthening;Both;20 reps;10 reps    Retraction Limitations  using grey ball in scap area for resistance      Shoulder Exercises: Pulleys   Flexion  5 minutes    Other Pulley Exercises  seated UE ranger x61mn      Moist Heat Therapy   Number Minutes Moist Heat  15 Minutes    Moist Heat Location  Cervical      Electrical Stimulation   Electrical Stimulation Location  cervical    Electrical Stimulation Action  IFC    Electrical Stimulation Parameters  80-150hz  x140m    Electrical Stimulation Goals  Pain      Manual Therapy   Manual Therapy  Soft tissue mobilization;Myofascial release    Manual therapy comments  gentle manual STW/MFR to bil cervial spinals and UT area to reduce pain and tone               PT Short Term Goals - 11/14/17 1214      PT SHORT TERM GOAL #1   Title  STG's=LTG's.        PT Long Term Goals - 11/16/17 0905      PT LONG TERM GOAL #1   Title  Independent with a HEP  for postural and shoulder strengthening    Time  8    Period  Weeks    Status  On-going      PT LONG TERM GOAL #2   Title  decreased pain in neck with extension to <= 1/10.    Time  8    Period  Weeks    Status  On-going      PT LONG TERM GOAL #3   Title  Improved R shoulder flex to 100 degrees or more to improve function.    Time  8    Period  Weeks    Status  On-going      PT LONG TERM GOAL #4   Title  Decreased numbness in R hand by 50% to help with ADLS    Time  8    Period  Weeks    Status  On-going            Plan - 11/20/17 089562  Clinical Impression Statement  Patient tolerated treatment well today. Patient did well with exercises and able to progress today. Patient has more pain in cervical area esp with moving from flexion to ext. . Patient feels some improvement in shoulder yet some stiffness ongoing. Goals progressing.     Rehab Potential  Good    PT Frequency  2x / week    PT Duration  8 weeks    PT Next Visit Plan  Focus on postural correction, cervical stabilization, R shoulder ROM/strength; STW to subocciptials and paraspinals prn; passiver R wrist stretching; modalities prn for pain in cspine.    Consulted and Agree with Plan of Care  Patient       Patient will benefit from skilled therapeutic intervention in order to improve the following deficits and impairments:  Pain, Decreased strength, Impaired UE functional use, Decreased range of motion, Postural dysfunction  Visit Diagnosis: Cervicalgia  Stiffness of right shoulder, not elsewhere classified  Muscle weakness (generalized)  Stiffness of right wrist, not elsewhere classified     Problem List Patient Active Problem List   Diagnosis Date Noted  . Closed fracture of right proximal humerus 08/09/2017  . Long-term use of high-risk medication 01/19/2017  . Accelerated  hypertension 12/08/2014  . Thyroid activity decreased 12/08/2014  . Essential hypertension 08/09/2012  . Atrial flutter  ([place]) 08/30/2010  . Hyperlipidemia 08/30/2010  . Long term current use of anticoagulant therapy 08/24/2010  . Atrial fibrillation, chronic ([place]) 08/17/2010    DUNFORD, CHRISTINA P, PTA 11/20/2017, 9:15 AM  [facility] [place], [place], [address] Phone: [phone]   Fax:  [phone]  Name: Larry Gates MRN: [id number] Date of Birth: 09/21/1928  PHYSICAL THERAPY DISCHARGE SUMMARY  Visits from Start of Care: 3.  Current functional level related to goals / functional outcomes: See above.   Remaining deficits: See below.   Education / Equipment: HEP Plan: Patient agrees to discharge.  Patient goals were not met. Patient is being discharged due to not returning since the last visit.  ?????         Mali Applegate MPT

## 2017-12-08 ENCOUNTER — Ambulatory Visit: Payer: Medicare Other | Admitting: Pharmacist Clinician (PhC)/ Clinical Pharmacy Specialist

## 2017-12-08 DIAGNOSIS — I4891 Unspecified atrial fibrillation: Secondary | ICD-10-CM | POA: Diagnosis not present

## 2017-12-08 LAB — COAGUCHEK XS/INR WAIVED
INR: 2.2 — ABNORMAL HIGH (ref 0.9–1.1)
Prothrombin Time: 26.9 s

## 2017-12-08 NOTE — Patient Instructions (Signed)
Description   Continue taking warfarin the same way  INR 2.2 (Goal is between 2-3)  Perfect reading

## 2018-01-03 ENCOUNTER — Encounter: Payer: Self-pay | Admitting: Pharmacist Clinician (PhC)/ Clinical Pharmacy Specialist

## 2018-01-26 ENCOUNTER — Other Ambulatory Visit: Payer: Self-pay | Admitting: Cardiology

## 2018-01-29 NOTE — Progress Notes (Signed)
HPI The patient has a history of atrial flutter.  He has had a mildly reduce EF but only minor CAD on cath in 2012.  His most recent EF was normal with some LVH. He has had cardioversion x 2.  He seems to have maintained NSR on amiodarone.   However, at the last visit he was not taking amiodarone or Cardizem.    He returns now and I think he has been compliant with all of his medicines.  He lives independently.  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  He says with activity such as that he denies any cardiovascular symptoms. The patient denies any new symptoms such as chest discomfort, neck or arm discomfort. There has been no new shortness of breath, PND or orthopnea. There have been no reported palpitations, presyncope or syncope.   Allergies  Allergen Reactions  . Penicillins Rash    Current Outpatient Medications  Medication Sig Dispense Refill  . amiodarone (PACERONE) 200 MG tablet TAKE 1 TABLET BY MOUTH ONCE DAILY 30 tablet 9  . CARTIA XT 120 MG 24 hr capsule TAKE 1 CAPSULE BY MOUTH ONCE DAILY 90 capsule 1  . levothyroxine (SYNTHROID, LEVOTHROID) 88 MCG tablet Take 1 tablet (88 mcg total) by mouth daily before breakfast. 90 tablet 3  . warfarin (COUMADIN) 2 MG tablet Take 1 tablet (2 mg total) by mouth daily. 90 tablet 3  . chlorthalidone (HYGROTON) 25 MG tablet Take 1 tablet (25 mg total) by mouth daily. 90 tablet 3   No current facility-administered medications for this visit.     Past Medical History:  Diagnosis Date  . Anticoagulated on Coumadin    a. Followed @ [facility]  . Atypical atrial flutter ([place])    a. Dx 07/2010 and coumadin initiated;  b. 08/2010 s/p DCCV.  . Cataract   . Hard of hearing   . Midsternal chest pain    a. In setting of A Flutter 07/2010;  b. Cath 07/2010: nonobs dzs  . Prostate cancer ([place])   . TIA (transient ischemic attack)   . Vertigo     Past Surgical History:  Procedure Laterality Date  . APPENDECTOMY    .  CARDIOVERSION  10/14/2011   Procedure: CARDIOVERSION;  Surgeon: Lelon Perla, MD;  Location: [place];  Service: Cardiovascular;  Laterality: N/A;  . CARDIOVERSION N/A 07/24/2012   Procedure: CARDIOVERSION;  Surgeon: [name], MD;  Location: [place];  Service: Cardiovascular;  Laterality: N/A;  . EYE SURGERY Bilateral    cataract  . I&D EXTREMITY Left 05/04/2013   Procedure: IRRIGATION AND DEBRIDEMENT of left hand with repair of lacerations and revision amputation of index finger;  Surgeon: Roseanne Kaufman, MD;  Location: [place];  Service: Orthopedics;  Laterality: Left;  . TONSILLECTOMY      ROS:   As stated in the HPI and negative for all other systems.  PHYSICAL EXAM BP (!) 193/91   Pulse 71   Ht 5\' 6"  (1.676 m)   Wt 140 lb (63.5 kg)   BMI 22.60 kg/m   GENERAL:  Well appearing NECK:  No jugular venous distention, waveform within normal limits, carotid upstroke brisk and symmetric, no bruits, no thyromegaly LUNGS:  Clear to auscultation bilaterally CHEST:  Unremarkable HEART:  PMI not displaced or sustained,S1 and S2 within normal limits, no S3, no S4, no clicks, no rubs, soft apical nonradiating systolic early peaking murmur, no diastolic murmurs ABD:  Flat, positive bowel sounds  normal in frequency in pitch, no bruits, no rebound, no guarding, no midline pulsatile mass, no hepatomegaly, no splenomegaly EXT:  2 plus pulses throughout, no edema, no cyanosis no clubbing   EKG: Sinus rhythm, rate 71, axis within normal limits, intervals within normal limits, no acute ST-T wave changes.  Lab Results  Component Value Date   TSH 4.900 (H) 02/24/2017   ALT 12 07/29/2016   AST 21 07/29/2016   ALKPHOS 99 07/29/2016   BILITOT 0.9 07/29/2016   PROT 6.2 07/29/2016   ALBUMIN 4.0 07/29/2016     ASSESSMENT AND PLAN  ATRIAL FLUTTER:   [name] has a CHA2DS2 - VASc score of 3 with a risk of stroke of 3.2%.  He will need follow up labs for his amio and we will put this  into the computer when he gets his warfarin checked.  He seems to be maintaining sinus rhythm with the amiodarone.  He tolerates anticoagulation.  No change in therapy.  HTN:   His blood pressure is not controlled.  I am getting a chlorthalidone with follow-up labs as above.  I will also asked to get follow-up blood pressure check when he goes to get his Coumadin checked.

## 2018-01-31 ENCOUNTER — Ambulatory Visit (INDEPENDENT_AMBULATORY_CARE_PROVIDER_SITE_OTHER): Payer: Medicare Other | Admitting: Cardiology

## 2018-01-31 ENCOUNTER — Encounter: Payer: Self-pay | Admitting: Cardiology

## 2018-01-31 VITALS — BP 193/91 | HR 71 | Ht 66.0 in | Wt 140.0 lb

## 2018-01-31 DIAGNOSIS — Z79899 Other long term (current) drug therapy: Secondary | ICD-10-CM | POA: Diagnosis not present

## 2018-01-31 DIAGNOSIS — I10 Essential (primary) hypertension: Secondary | ICD-10-CM

## 2018-01-31 DIAGNOSIS — I482 Chronic atrial fibrillation, unspecified: Secondary | ICD-10-CM | POA: Diagnosis not present

## 2018-01-31 MED ORDER — CHLORTHALIDONE 25 MG PO TABS: 25.0000 mg | ORAL_TABLET | Freq: Every day | ORAL | 3 refills | Status: DC

## 2018-01-31 NOTE — Patient Instructions (Addendum)
Medication Instructions:  Please start Chlorthalidone 25 mg daily. Continue all other medications as listed.  If you need a refill on your cardiac medications before your next appointment, please call your pharmacy.   Please have lipid panel and CMP at your next appointment with Memory Argue.  Please do not eat/drink after midnight the night before for this blood work. (02/14/2018)  Follow-Up: Follow up in 6 months with Dr Percival Spanish in [place].  Please contact the office at [phone] in February for an April 2020 appointment.  Thank you for choosing [facility]!!

## 2018-02-12 ENCOUNTER — Other Ambulatory Visit: Payer: Self-pay | Admitting: Cardiology

## 2018-02-14 ENCOUNTER — Encounter: Payer: Self-pay | Admitting: Family Medicine

## 2018-02-14 ENCOUNTER — Ambulatory Visit (INDEPENDENT_AMBULATORY_CARE_PROVIDER_SITE_OTHER): Payer: Medicare Other | Admitting: Family Medicine

## 2018-02-14 ENCOUNTER — Telehealth: Payer: Self-pay | Admitting: *Deleted

## 2018-02-14 ENCOUNTER — Ambulatory Visit: Payer: Medicare Other | Admitting: Pharmacist Clinician (PhC)/ Clinical Pharmacy Specialist

## 2018-02-14 VITALS — BP 194/87 | HR 75 | Temp 97.0°F | Ht 66.0 in | Wt 136.6 lb

## 2018-02-14 VITALS — BP 192/96 | HR 74

## 2018-02-14 DIAGNOSIS — Z79899 Other long term (current) drug therapy: Secondary | ICD-10-CM

## 2018-02-14 DIAGNOSIS — H6123 Impacted cerumen, bilateral: Secondary | ICD-10-CM | POA: Diagnosis not present

## 2018-02-14 DIAGNOSIS — I10 Essential (primary) hypertension: Secondary | ICD-10-CM

## 2018-02-14 DIAGNOSIS — E785 Hyperlipidemia, unspecified: Secondary | ICD-10-CM

## 2018-02-14 DIAGNOSIS — I4891 Unspecified atrial fibrillation: Secondary | ICD-10-CM

## 2018-02-14 DIAGNOSIS — I482 Chronic atrial fibrillation, unspecified: Secondary | ICD-10-CM | POA: Diagnosis not present

## 2018-02-14 DIAGNOSIS — Z23 Encounter for immunization: Secondary | ICD-10-CM

## 2018-02-14 LAB — COAGUCHEK XS/INR WAIVED
INR: 1.9 — AB (ref 0.9–1.1)
PROTHROMBIN TIME: 22.9 s

## 2018-02-14 LAB — COMPREHENSIVE METABOLIC PANEL
ALT: 14 IU/L (ref 0–44)
AST: 19 IU/L (ref 0–40)
Albumin/Globulin Ratio: 1.9 (ref 1.2–2.2)
Albumin: 4.2 g/dL (ref 3.5–4.7)
Alkaline Phosphatase: 111 IU/L (ref 39–117)
BUN/Creatinine Ratio: 22 (ref 10–24)
BUN: 27 mg/dL (ref 8–27)
Bilirubin Total: 1.1 mg/dL (ref 0.0–1.2)
CALCIUM: 9.1 mg/dL (ref 8.6–10.2)
CO2: 25 mmol/L (ref 20–29)
CREATININE: 1.25 mg/dL (ref 0.76–1.27)
Chloride: 105 mmol/L (ref 96–106)
GFR calc Af Amer: 59 mL/min/{1.73_m2} — ABNORMAL LOW (ref >59)
GFR calc non Af Amer: 51 mL/min/{1.73_m2} — ABNORMAL LOW (ref >59)
Globulin, Total: 2.2 g/dL (ref 1.5–4.5)
Glucose: 90 mg/dL (ref 65–99)
Potassium: 4 mmol/L (ref 3.5–5.2)
Sodium: 142 mmol/L (ref 134–144)
Total Protein: 6.4 g/dL (ref 6.0–8.5)

## 2018-02-14 MED ORDER — DILTIAZEM HCL ER COATED BEADS 240 MG PO CP24: 240.0000 mg | ORAL_CAPSULE | Freq: Every day | ORAL | 6 refills | Status: DC

## 2018-02-14 NOTE — Patient Instructions (Signed)
Description   Continue taking warfarin the same way  INR 1.9 (Goal is between 2-3)  Slightly thick today

## 2018-02-14 NOTE — Telephone Encounter (Signed)
Attempted to call pt at home # however he is too [place] to understand.  He requested I call his son [phone]).  Called and spoke with son who understands pt is to increase Cartia to 240 mg daily and follow up with BPs.   Son will notify patient to increase medication.  New RX sent into [facility].  Pt will check BP at home and son will call the results in - in appr 1 week.  Further f/u will be based on this information.

## 2018-02-14 NOTE — Progress Notes (Signed)
BP results have been sent to Dr. Percival Spanish per his request.  Chlorthalidone was added at his last visit.  Labs were drawn today for Dr. Percival Spanish.

## 2018-02-14 NOTE — Telephone Encounter (Signed)
-----   Message from Minus Breeding, MD sent at 02/14/2018  4:35 PM EDT ----- Regarding: FW: BP check BP is still elevated.  Increase the Cardizem to 240 mg po daily and follow BP.    ----- Message ----- From: Memory Argue, PharmD Sent: 02/14/2018   8:07 AM EDT To: Minus Breeding, MD Subject: BP check                                       Good Morning,  Larry Gates's BP is elevated still this morning.  I checked in twice and it is 192/96 with P 74.  Labs have been drawn.  Thanks  [employer]

## 2018-02-14 NOTE — Progress Notes (Signed)
BP (!) 194/87   Pulse 75   Temp (!) 97 F (36.1 C) (Oral)   Ht 5\' 6"  (1.676 m)   Wt 62 kg   BMI 22.05 kg/m    Subjective:    [name], male    DOB: 1928-12-22, 84 y.o.   MRN: [id number]  HPI: Larry Gates is a 84 y.o. male presenting on 02/14/2018 for ear cleaning bilaterally. Patient has a history of bilateral hearing aids. He went to see his hearing aid doctor 1 month ago for a hearing test, but the doctor was unable to perform the test due to impacted ear wax bilaterally. Patient is here for ear lavage. Patient states he has not noticed any significant hearing loss over the last couple months. Hearing is decreased at baseline. He denies ear pain, tinnitus, dizziness, vertigo, headache, rhinorrhea, fever, or chills.    Relevant past medical, surgical, family and social history reviewed and updated as indicated. Interim medical history since our last visit reviewed. Allergies and medications reviewed and updated.  Review of Systems  Constitutional: Negative for chills and fever.  HENT: Negative for congestion, ear pain, hearing loss, rhinorrhea and tinnitus.   Eyes: Negative for redness and itching.  Respiratory: Negative for shortness of breath.   Cardiovascular: Negative for chest pain.  Skin: Negative.   Neurological: Negative for dizziness, light-headedness and headaches.    Per HPI unless specifically indicated above   Allergies as of 02/14/2018      Reactions   Penicillins Rash      Medication List        Accurate as of 02/14/18  8:55 AM. Always use your most recent med list.          amiodarone 200 MG tablet Commonly known as:  PACERONE TAKE 1 TABLET BY MOUTH ONCE DAILY   CARTIA XT 120 MG 24 hr capsule Generic drug:  diltiazem TAKE 1 CAPSULE BY MOUTH ONCE DAILY   chlorthalidone 25 MG tablet Commonly known as:  HYGROTON Take 1 tablet (25 mg total) by mouth daily.   levothyroxine 88 MCG tablet Commonly known as:  SYNTHROID,  LEVOTHROID Take 1 tablet (88 mcg total) by mouth daily before breakfast.   warfarin 2 MG tablet Commonly known as:  COUMADIN Take as directed by the anticoagulation clinic. If you are unsure how to take this medication, talk to your nurse or doctor. Original instructions:  Take 1 tablet (2 mg total) by mouth daily.          Objective:    BP (!) 194/87   Pulse 75   Temp (!) 97 F (36.1 C) (Oral)   Ht 5\' 6"  (1.676 m)   Wt 62 kg   BMI 22.05 kg/m   Wt Readings from Last 3 Encounters:  02/14/18 62 kg  01/31/18 63.5 kg  10/04/17 62.6 kg    Physical Exam  Constitutional: He is oriented to person, place, and time. He appears well-developed and well-nourished. No distress.  HENT:  Head: Normocephalic and atraumatic.  Nose: Nose normal.  Mouth/Throat: No oropharyngeal exudate.   Could not visualize TM due to bilateral ear wax impaction. Decreased hearing at baseline.  Eyes: Pupils are equal, round, and reactive to light. Conjunctivae and EOM are normal.  Neck: Neck supple.  Cardiovascular: Normal rate, regular rhythm and normal heart sounds. Exam reveals no gallop and no friction rub.  No murmur heard. Pulmonary/Chest: Effort normal and breath sounds normal.  Musculoskeletal: He exhibits no edema.  Neurological: He is alert and oriented to person, place, and time.  Skin: Skin is warm.  Psychiatric: He has a normal mood and affect.    Nurse attempted to remove cerumen with lavage but unable to, used our scope and plastic extractor to help remove cerumen and the nurse came back to lavage afterwards.  Able to remove a good portion of the wax with second lavage    Assessment & Plan:   Problem List Items Addressed This Visit    None    Visit Diagnoses    Bilateral hearing loss due to cerumen impaction    -  Primary     Ear Wax Impaction, bilaterally Patient with history of bilateral hearing aids was sent by hearing aid doctor for ear cleaning. On exam TMs are not visible to  due wax build-up. Patient denies recent change in hearing, pain, tinnitus, congestion, dizziness, fever, chills. Ear lavage performed in office.   Follow up plan: Return if symptoms worsen or fail to improve.  Counseling provided for all of the vaccine components No orders of the defined types were placed in this encounter.  Patient seen and examined with Cadence Kathlen Mody, PA student, agree with assessment and plan above. Caryl Pina, MD [facility] 02/14/2018, 8:55 AM

## 2018-02-15 ENCOUNTER — Encounter: Payer: Self-pay | Admitting: *Deleted

## 2018-02-15 LAB — LIPID PANEL
CHOLESTEROL TOTAL: 146 mg/dL (ref 100–199)
Chol/HDL Ratio: 2.6 ratio (ref 0.0–5.0)
HDL: 57 mg/dL (ref >39)
LDL CALC: 78 mg/dL (ref 0–99)
TRIGLYCERIDES: 57 mg/dL (ref 0–149)
VLDL CHOLESTEROL CAL: 11 mg/dL (ref 5–40)

## 2018-02-15 LAB — TSH: TSH: 3.86 u[IU]/mL (ref 0.450–4.500)

## 2018-04-04 ENCOUNTER — Ambulatory Visit: Payer: Medicare Other | Admitting: Pharmacist Clinician (PhC)/ Clinical Pharmacy Specialist

## 2018-04-04 DIAGNOSIS — I4891 Unspecified atrial fibrillation: Secondary | ICD-10-CM

## 2018-04-04 LAB — COAGUCHEK XS/INR WAIVED
INR: 1.6 — ABNORMAL HIGH (ref 0.9–1.1)
Prothrombin Time: 18.9 s

## 2018-04-04 NOTE — Patient Instructions (Signed)
Description   Take 1 1/2 tablets daily, then take 1 tablet every day of the week.  INR 1.6  (Goal is between 2-3)  Slightly thick today

## 2018-05-09 ENCOUNTER — Ambulatory Visit: Payer: Medicare Other | Admitting: Pharmacist Clinician (PhC)/ Clinical Pharmacy Specialist

## 2018-05-09 DIAGNOSIS — I4891 Unspecified atrial fibrillation: Secondary | ICD-10-CM | POA: Diagnosis not present

## 2018-05-09 LAB — COAGUCHEK XS/INR WAIVED
INR: 2.4 — ABNORMAL HIGH (ref 0.9–1.1)
PROTHROMBIN TIME: 29.3 s

## 2018-05-09 NOTE — Patient Instructions (Signed)
Description   Continue taking 1 tablet a day   INR 2.4  (Goal is between 2-3)  Perfect reading today!

## 2018-05-21 ENCOUNTER — Other Ambulatory Visit: Payer: Self-pay | Admitting: Nurse Practitioner

## 2018-05-21 ENCOUNTER — Encounter: Payer: Self-pay | Admitting: Nurse Practitioner

## 2018-05-21 ENCOUNTER — Ambulatory Visit (INDEPENDENT_AMBULATORY_CARE_PROVIDER_SITE_OTHER): Payer: Medicare Other

## 2018-05-21 ENCOUNTER — Ambulatory Visit (INDEPENDENT_AMBULATORY_CARE_PROVIDER_SITE_OTHER): Payer: Medicare Other | Admitting: Nurse Practitioner

## 2018-05-21 ENCOUNTER — Telehealth: Payer: Self-pay | Admitting: Family Medicine

## 2018-05-21 VITALS — BP 120/61 | HR 100 | Temp 97.5°F | Ht 66.0 in | Wt 140.0 lb

## 2018-05-21 DIAGNOSIS — J181 Lobar pneumonia, unspecified organism: Secondary | ICD-10-CM | POA: Diagnosis not present

## 2018-05-21 DIAGNOSIS — R05 Cough: Secondary | ICD-10-CM

## 2018-05-21 DIAGNOSIS — R2681 Unsteadiness on feet: Secondary | ICD-10-CM

## 2018-05-21 DIAGNOSIS — R059 Cough, unspecified: Secondary | ICD-10-CM

## 2018-05-21 DIAGNOSIS — J189 Pneumonia, unspecified organism: Secondary | ICD-10-CM

## 2018-05-21 MED ORDER — AZITHROMYCIN 250 MG PO TABS: ORAL_TABLET | ORAL | 0 refills | Status: DC

## 2018-05-21 NOTE — Patient Instructions (Signed)
Community-Acquired Pneumonia, Adult  Pneumonia is an infection of the lungs. It causes swelling in the airways of the lungs. Mucus and fluid may also build up inside the airways.  One type of pneumonia can happen while a person is in a hospital. A different type can happen when a person is not in a hospital (community-acquired pneumonia).   What are the causes?    This condition is caused by germs (viruses, bacteria, or fungi). Some types of germs can be passed from one person to another. This can happen when you breathe in droplets from the cough or sneeze of an infected person.  What increases the risk?  You are more likely to develop this condition if you:   Have a long-term (chronic) disease, such as:  ? Chronic obstructive pulmonary disease (COPD).  ? Asthma.  ? Cystic fibrosis.  ? Congestive heart failure.  ? Diabetes.  ? Kidney disease.   Have HIV.   Have sickle cell disease.   Have had your spleen removed.   Do not take good care of your teeth and mouth (poor dental hygiene).   Have a medical condition that increases the risk of breathing in droplets from your own mouth and nose.   Have a weakened body defense system (immune system).   Are a smoker.   Travel to areas where the germs that cause this illness are common.   Are around certain animals or the places they live.  What are the signs or symptoms?   A dry cough.   A wet (productive) cough.   Fever.   Sweating.   Chest pain. This often happens when breathing deeply or coughing.   Fast breathing or trouble breathing.   Shortness of breath.   Shaking chills.   Feeling tired (fatigue).   Muscle aches.  How is this treated?  Treatment for this condition depends on many things. Most adults can be treated at home. In some cases, treatment must happen in a hospital. Treatment may include:   Medicines given by mouth or through an IV tube.   Being given extra oxygen.   Respiratory therapy.  In rare cases, treatment for very bad pneumonia  may include:   Using a machine to help you breathe.   Having a procedure to remove fluid from around your lungs.  Follow these instructions at home:  Medicines   Take over-the-counter and prescription medicines only as told by your doctor.  ? Only take cough medicine if you are losing sleep.   If you were prescribed an antibiotic medicine, take it as told by your doctor. Do not stop taking the antibiotic even if you start to feel better.  General instructions     Sleep with your head and neck raised (elevated). You can do this by sleeping in a recliner or by putting a few pillows under your head.   Rest as needed. Get at least 8 hours of sleep each night.   Drink enough water to keep your pee (urine) pale yellow.   Eat a healthy diet that includes plenty of vegetables, fruits, whole grains, low-fat dairy products, and lean protein.   Do not use any products that contain nicotine or tobacco. These include cigarettes, e-cigarettes, and chewing tobacco. If you need help quitting, ask your doctor.   Keep all follow-up visits as told by your doctor. This is important.  How is this prevented?  A shot (vaccine) can help prevent pneumonia. Shots are often suggested for:   People   older than 83 years of age.   People older than 83 years of age who:  ? Are having cancer treatment.  ? Have long-term (chronic) lung disease.  ? Have problems with their body's defense system.  You may also prevent pneumonia if you take these actions:   Get the flu (influenza) shot every year.   Go to the dentist as often as told.   Wash your hands often. If you cannot use soap and water, use hand sanitizer.  Contact a doctor if:   You have a fever.   You lose sleep because your cough medicine does not help.  Get help right away if:   You are short of breath and it gets worse.   You have more chest pain.   Your sickness gets worse. This is very serious if:  ? You are an older adult.  ? Your body's defense system is weak.   You  cough up blood.  Summary   Pneumonia is an infection of the lungs.   Most adults can be treated at home. Some will need treatment in a hospital.   Drink enough water to keep your pee pale yellow.   Get at least 8 hours of sleep each night.  This information is not intended to replace advice given to you by your health care provider. Make sure you discuss any questions you have with your health care provider.  Document Released: 09/21/2007 Document Revised: 11/30/2017 Document Reviewed: 11/30/2017  Elsevier Interactive Patient Education  2019 [employer]

## 2018-05-21 NOTE — Telephone Encounter (Signed)
Needs to be seen for antibiotic

## 2018-05-21 NOTE — Telephone Encounter (Signed)
Seen MMM today. Please advise

## 2018-05-21 NOTE — Progress Notes (Signed)
   Subjective:    [name], male    DOB: Jul 05, 1928, 85 y.o.   MRN: [id number]   Chief Complaint: Nasal Congestion and Gait Problem   HPI Patient comes in today with 2 complaints: - cough and congestion that started about 4 days ago. Cough has gradually gotten worse. Had  Fever of 101 yesterday. - since he has been sick his balance is off. He has occasional falls anyway.   Review of Systems  Constitutional: Positive for chills and fever.  HENT: Positive for congestion, ear pain, rhinorrhea, sinus pain and trouble swallowing.   Respiratory: Positive for cough.   Gastrointestinal: Negative.   Musculoskeletal: Positive for gait problem (unstaedy since been sick).  Neurological: Negative for dizziness and headaches.  All other systems reviewed and are negative.      Objective:   Physical Exam Vitals signs and nursing note reviewed.  Constitutional:      Appearance: He is obese.  HENT:     Right Ear: Tympanic membrane, ear canal and external ear normal.     Left Ear: Tympanic membrane, ear canal and external ear normal.     Nose: Congestion present.     Mouth/Throat:     Mouth: Mucous membranes are dry.  Neck:     Musculoskeletal: Normal range of motion.  Cardiovascular:     Rate and Rhythm: Normal rate.  Pulmonary:     Effort: Pulmonary effort is normal. No respiratory distress.     Breath sounds: Rales (left lower lobe) present.     Comments: Diminished breath sounds left lower lobe Musculoskeletal:     Comments: GAIT SLOW AND STEADY- WALKING WITH WALKING STICK  Lymphadenopathy:     Cervical: No cervical adenopathy.  Skin:    General: Skin is warm and dry.  Neurological:     General: No focal deficit present.     Mental Status: He is alert and oriented to person, place, and time.  Psychiatric:        Mood and Affect: Mood normal.        Behavior: Behavior normal.    BP 120/61   Pulse 100   Temp (!) 97.5 F (36.4 C) (Oral)   Ht 5\' 6"  (1.676 m)    Wt 140 lb (63.5 kg)   SpO2 95%   BMI 22.60 kg/m   Chest xray- left lower lower pneumonia-Preliminary reading by Ronnald Collum, FNP  [facility]       Assessment & Plan:  Larry Gates in today with chief complaint of Nasal Congestion and Gait Problem   1. Cough mucinex bid - DG Chest 2 View; Future  2. Pneumonia of left lower lobe due to infectious organism ([place]) Run humidifier Motrin or tylenol for fever Recheck chest xray in 6-8 weeks - azithromycin (ZITHROMAX Z-PAK) 250 MG tablet; As directed  Dispense: 6 tablet; Refill: 0  3. Unsteady gait Will get pneumonia resolved and if gait doe snot improve will  Order physical therapy    Mary-Margaret Hassell Done, FNP

## 2018-05-21 NOTE — Telephone Encounter (Signed)
All antibiotic for pneumonia will interfere with coumadin- have INR checked Friday. Family aware

## 2018-05-22 ENCOUNTER — Inpatient Hospital Stay (HOSPITAL_COMMUNITY)
Admission: EM | Admit: 2018-05-22 | Discharge: 2018-05-26 | DRG: 871 | Payer: Medicare Other | Attending: Family Medicine | Admitting: Family Medicine

## 2018-05-22 ENCOUNTER — Other Ambulatory Visit: Payer: Self-pay

## 2018-05-22 ENCOUNTER — Emergency Department (HOSPITAL_COMMUNITY): Payer: Medicare Other

## 2018-05-22 ENCOUNTER — Encounter (HOSPITAL_COMMUNITY): Payer: Self-pay | Admitting: Emergency Medicine

## 2018-05-22 DIAGNOSIS — I4891 Unspecified atrial fibrillation: Secondary | ICD-10-CM | POA: Diagnosis present

## 2018-05-22 DIAGNOSIS — E785 Hyperlipidemia, unspecified: Secondary | ICD-10-CM | POA: Diagnosis not present

## 2018-05-22 DIAGNOSIS — I517 Cardiomegaly: Secondary | ICD-10-CM | POA: Diagnosis not present

## 2018-05-22 DIAGNOSIS — N183 Chronic kidney disease, stage 3 (moderate): Secondary | ICD-10-CM | POA: Diagnosis present

## 2018-05-22 DIAGNOSIS — Z7901 Long term (current) use of anticoagulants: Secondary | ICD-10-CM

## 2018-05-22 DIAGNOSIS — J189 Pneumonia, unspecified organism: Secondary | ICD-10-CM | POA: Diagnosis not present

## 2018-05-22 DIAGNOSIS — J9690 Respiratory failure, unspecified, unspecified whether with hypoxia or hypercapnia: Secondary | ICD-10-CM

## 2018-05-22 DIAGNOSIS — I4892 Unspecified atrial flutter: Secondary | ICD-10-CM | POA: Diagnosis not present

## 2018-05-22 DIAGNOSIS — E876 Hypokalemia: Secondary | ICD-10-CM | POA: Diagnosis present

## 2018-05-22 DIAGNOSIS — Z79890 Hormone replacement therapy (postmenopausal): Secondary | ICD-10-CM

## 2018-05-22 DIAGNOSIS — R739 Hyperglycemia, unspecified: Secondary | ICD-10-CM | POA: Diagnosis not present

## 2018-05-22 DIAGNOSIS — A419 Sepsis, unspecified organism: Secondary | ICD-10-CM | POA: Diagnosis not present

## 2018-05-22 DIAGNOSIS — N189 Chronic kidney disease, unspecified: Secondary | ICD-10-CM | POA: Diagnosis not present

## 2018-05-22 DIAGNOSIS — I1310 Hypertensive heart and chronic kidney disease without heart failure, with stage 1 through stage 4 chronic kidney disease, or unspecified chronic kidney disease: Secondary | ICD-10-CM | POA: Diagnosis present

## 2018-05-22 DIAGNOSIS — J9601 Acute respiratory failure with hypoxia: Secondary | ICD-10-CM | POA: Diagnosis not present

## 2018-05-22 DIAGNOSIS — Z880 Allergy status to penicillin: Secondary | ICD-10-CM

## 2018-05-22 DIAGNOSIS — A4159 Other Gram-negative sepsis: Secondary | ICD-10-CM | POA: Diagnosis not present

## 2018-05-22 DIAGNOSIS — E871 Hypo-osmolality and hyponatremia: Secondary | ICD-10-CM | POA: Diagnosis present

## 2018-05-22 DIAGNOSIS — J90 Pleural effusion, not elsewhere classified: Secondary | ICD-10-CM | POA: Diagnosis not present

## 2018-05-22 DIAGNOSIS — Z8546 Personal history of malignant neoplasm of prostate: Secondary | ICD-10-CM

## 2018-05-22 DIAGNOSIS — E039 Hypothyroidism, unspecified: Secondary | ICD-10-CM | POA: Diagnosis present

## 2018-05-22 DIAGNOSIS — Z820 Family history of epilepsy and other diseases of the nervous system: Secondary | ICD-10-CM | POA: Diagnosis not present

## 2018-05-22 DIAGNOSIS — R0602 Shortness of breath: Secondary | ICD-10-CM | POA: Diagnosis not present

## 2018-05-22 DIAGNOSIS — A481 Legionnaires' disease: Secondary | ICD-10-CM | POA: Diagnosis present

## 2018-05-22 DIAGNOSIS — J984 Other disorders of lung: Secondary | ICD-10-CM | POA: Diagnosis not present

## 2018-05-22 DIAGNOSIS — Z992 Dependence on renal dialysis: Secondary | ICD-10-CM | POA: Diagnosis not present

## 2018-05-22 DIAGNOSIS — R0902 Hypoxemia: Secondary | ICD-10-CM

## 2018-05-22 DIAGNOSIS — I10 Essential (primary) hypertension: Secondary | ICD-10-CM | POA: Diagnosis not present

## 2018-05-22 DIAGNOSIS — N179 Acute kidney failure, unspecified: Secondary | ICD-10-CM | POA: Diagnosis not present

## 2018-05-22 DIAGNOSIS — J986 Disorders of diaphragm: Secondary | ICD-10-CM | POA: Diagnosis not present

## 2018-05-22 DIAGNOSIS — E870 Hyperosmolality and hypernatremia: Secondary | ICD-10-CM | POA: Diagnosis not present

## 2018-05-22 DIAGNOSIS — Z823 Family history of stroke: Secondary | ICD-10-CM | POA: Diagnosis not present

## 2018-05-22 DIAGNOSIS — R339 Retention of urine, unspecified: Secondary | ICD-10-CM | POA: Diagnosis not present

## 2018-05-22 DIAGNOSIS — R918 Other nonspecific abnormal finding of lung field: Secondary | ICD-10-CM | POA: Diagnosis not present

## 2018-05-22 DIAGNOSIS — E43 Unspecified severe protein-calorie malnutrition: Secondary | ICD-10-CM | POA: Diagnosis not present

## 2018-05-22 DIAGNOSIS — R6520 Severe sepsis without septic shock: Secondary | ICD-10-CM | POA: Diagnosis not present

## 2018-05-22 DIAGNOSIS — Z79899 Other long term (current) drug therapy: Secondary | ICD-10-CM | POA: Diagnosis not present

## 2018-05-22 DIAGNOSIS — Z8673 Personal history of transient ischemic attack (TIA), and cerebral infarction without residual deficits: Secondary | ICD-10-CM | POA: Diagnosis not present

## 2018-05-22 DIAGNOSIS — J181 Lobar pneumonia, unspecified organism: Secondary | ICD-10-CM

## 2018-05-22 DIAGNOSIS — Z806 Family history of leukemia: Secondary | ICD-10-CM | POA: Diagnosis not present

## 2018-05-22 DIAGNOSIS — J690 Pneumonitis due to inhalation of food and vomit: Secondary | ICD-10-CM | POA: Diagnosis not present

## 2018-05-22 DIAGNOSIS — Z681 Body mass index (BMI) 19 or less, adult: Secondary | ICD-10-CM | POA: Diagnosis not present

## 2018-05-22 DIAGNOSIS — R0600 Dyspnea, unspecified: Secondary | ICD-10-CM | POA: Diagnosis not present

## 2018-05-22 DIAGNOSIS — H9190 Unspecified hearing loss, unspecified ear: Secondary | ICD-10-CM | POA: Diagnosis not present

## 2018-05-22 DIAGNOSIS — N170 Acute kidney failure with tubular necrosis: Secondary | ICD-10-CM | POA: Diagnosis not present

## 2018-05-22 DIAGNOSIS — I5030 Unspecified diastolic (congestive) heart failure: Secondary | ICD-10-CM | POA: Diagnosis not present

## 2018-05-22 LAB — CBC WITH DIFFERENTIAL/PLATELET
Abs Immature Granulocytes: 0.05 10*3/uL (ref 0.00–0.07)
Basophils Absolute: 0 10*3/uL (ref 0.0–0.1)
Basophils Relative: 0 %
Eosinophils Absolute: 0 10*3/uL (ref 0.0–0.5)
Eosinophils Relative: 0 %
HEMATOCRIT: 37.7 % — AB (ref 39.0–52.0)
Hemoglobin: 13.7 g/dL (ref 13.0–17.0)
Immature Granulocytes: 1 %
Lymphocytes Relative: 2 %
Lymphs Abs: 0.2 10*3/uL — ABNORMAL LOW (ref 0.7–4.0)
MCH: 31.6 pg (ref 26.0–34.0)
MCHC: 36.3 g/dL — ABNORMAL HIGH (ref 30.0–36.0)
MCV: 87.1 fL (ref 80.0–100.0)
Monocytes Absolute: 0.2 10*3/uL (ref 0.1–1.0)
Monocytes Relative: 3 %
Neutro Abs: 8 10*3/uL — ABNORMAL HIGH (ref 1.7–7.7)
Neutrophils Relative %: 94 %
Platelets: 133 10*3/uL — ABNORMAL LOW (ref 150–400)
RBC: 4.33 MIL/uL (ref 4.22–5.81)
RDW: 13.2 % (ref 11.5–15.5)
WBC: 8.4 10*3/uL (ref 4.0–10.5)
nRBC: 0 % (ref 0.0–0.2)

## 2018-05-22 LAB — COMPREHENSIVE METABOLIC PANEL
ALT: 27 U/L (ref 0–44)
AST: 37 U/L (ref 15–41)
Albumin: 3.2 g/dL — ABNORMAL LOW (ref 3.5–5.0)
Alkaline Phosphatase: 54 U/L (ref 38–126)
Anion gap: 15 (ref 5–15)
BUN: 99 mg/dL — ABNORMAL HIGH (ref 8–23)
CO2: 20 mmol/L — AB (ref 22–32)
Calcium: 8.5 mg/dL — ABNORMAL LOW (ref 8.9–10.3)
Chloride: 100 mmol/L (ref 98–111)
Creatinine, Ser: 4.58 mg/dL — ABNORMAL HIGH (ref 0.61–1.24)
GFR calc Af Amer: 12 mL/min — ABNORMAL LOW (ref >60)
GFR calc non Af Amer: 11 mL/min — ABNORMAL LOW (ref >60)
Glucose, Bld: 140 mg/dL — ABNORMAL HIGH (ref 70–99)
Potassium: 3.2 mmol/L — ABNORMAL LOW (ref 3.5–5.1)
SODIUM: 135 mmol/L (ref 135–145)
Total Bilirubin: 0.8 mg/dL (ref 0.3–1.2)
Total Protein: 7 g/dL (ref 6.5–8.1)

## 2018-05-22 LAB — URINALYSIS, ROUTINE W REFLEX MICROSCOPIC
Bacteria, UA: NONE SEEN
Bilirubin Urine: NEGATIVE
Glucose, UA: NEGATIVE mg/dL
Ketones, ur: NEGATIVE mg/dL
Leukocytes, UA: NEGATIVE
NITRITE: NEGATIVE
Protein, ur: 30 mg/dL — AB
SPECIFIC GRAVITY, URINE: 1.012 (ref 1.005–1.030)
pH: 5 (ref 5.0–8.0)

## 2018-05-22 LAB — BLOOD GAS, ARTERIAL
Acid-base deficit: 2.4 mmol/L — ABNORMAL HIGH (ref 0.0–2.0)
Bicarbonate: 23.1 mmol/L (ref 20.0–28.0)
FIO2: 28
O2 Saturation: 93.4 %
PATIENT TEMPERATURE: 37.8
pCO2 arterial: 33.3 mmHg (ref 32.0–48.0)
pH, Arterial: 7.424 (ref 7.350–7.450)
pO2, Arterial: 63.8 mmHg — ABNORMAL LOW (ref 83.0–108.0)

## 2018-05-22 LAB — LACTIC ACID, PLASMA
Lactic Acid, Venous: 1.7 mmol/L (ref 0.5–1.9)
Lactic Acid, Venous: 1 mmol/L (ref 0.5–1.9)

## 2018-05-22 LAB — INFLUENZA PANEL BY PCR (TYPE A & B)
INFLBPCR: NEGATIVE
Influenza A By PCR: NEGATIVE

## 2018-05-22 LAB — PROTIME-INR
INR: 2.69
Prothrombin Time: 28.2 seconds — ABNORMAL HIGH (ref 11.4–15.2)

## 2018-05-22 LAB — MAGNESIUM: Magnesium: 2 mg/dL (ref 1.7–2.4)

## 2018-05-22 MED ORDER — WARFARIN SODIUM 2 MG PO TABS
2.0000 mg | ORAL_TABLET | Freq: Once | ORAL | Status: AC
  Administered 2018-05-22: 2 mg via ORAL
  Filled 2018-05-22: qty 1

## 2018-05-22 MED ORDER — WARFARIN - PHARMACIST DOSING INPATIENT
Freq: Every day | Status: DC
  Administered 2018-05-23 – 2018-05-25 (×3)

## 2018-05-22 MED ORDER — ONDANSETRON HCL 4 MG PO TABS: 4.0000 mg | ORAL_TABLET | Freq: Four times a day (QID) | ORAL | Status: DC | PRN

## 2018-05-22 MED ORDER — IPRATROPIUM-ALBUTEROL 0.5-2.5 (3) MG/3ML IN SOLN
3.0000 mL | RESPIRATORY_TRACT | Status: DC | PRN
  Administered 2018-05-23: 3 mL via RESPIRATORY_TRACT
  Filled 2018-05-22: qty 3

## 2018-05-22 MED ORDER — SODIUM CHLORIDE 0.9% FLUSH
3.0000 mL | Freq: Once | INTRAVENOUS | Status: AC
  Administered 2018-05-22: 3 mL via INTRAVENOUS

## 2018-05-22 MED ORDER — SODIUM CHLORIDE 0.9 % IV SOLN
1000.0000 mL | INTRAVENOUS | Status: DC
  Administered 2018-05-22: 1000 mL via INTRAVENOUS

## 2018-05-22 MED ORDER — AMIODARONE HCL 200 MG PO TABS
200.0000 mg | ORAL_TABLET | Freq: Every day | ORAL | Status: DC
  Administered 2018-05-23 – 2018-05-25 (×3): 200 mg via ORAL
  Filled 2018-05-22 (×3): qty 1

## 2018-05-22 MED ORDER — DILTIAZEM HCL ER COATED BEADS 240 MG PO CP24
240.0000 mg | ORAL_CAPSULE | Freq: Every day | ORAL | Status: DC
  Administered 2018-05-23 – 2018-05-25 (×3): 240 mg via ORAL
  Filled 2018-05-22 (×5): qty 1

## 2018-05-22 MED ORDER — ACETAMINOPHEN 650 MG RE SUPP
650.0000 mg | Freq: Four times a day (QID) | RECTAL | Status: DC | PRN
  Administered 2018-05-25: 650 mg via RECTAL
  Filled 2018-05-22: qty 1

## 2018-05-22 MED ORDER — SODIUM CHLORIDE 0.9 % IV SOLN
500.0000 mg | INTRAVENOUS | Status: DC
  Administered 2018-05-22 – 2018-05-24 (×3): 500 mg via INTRAVENOUS
  Filled 2018-05-22 (×6): qty 500

## 2018-05-22 MED ORDER — SODIUM CHLORIDE 0.9 % IV BOLUS
500.0000 mL | Freq: Once | INTRAVENOUS | Status: AC
  Administered 2018-05-22: 500 mL via INTRAVENOUS

## 2018-05-22 MED ORDER — LEVOTHYROXINE SODIUM 88 MCG PO TABS
88.0000 ug | ORAL_TABLET | Freq: Every day | ORAL | Status: DC
  Administered 2018-05-23 – 2018-05-25 (×3): 88 ug via ORAL
  Filled 2018-05-22 (×3): qty 1

## 2018-05-22 MED ORDER — ONDANSETRON HCL 4 MG/2ML IJ SOLN: 4.0000 mg | Freq: Four times a day (QID) | INTRAMUSCULAR | Status: DC | PRN

## 2018-05-22 MED ORDER — SODIUM CHLORIDE 0.9 % IV SOLN
2.0000 g | INTRAVENOUS | Status: DC
  Administered 2018-05-22 – 2018-05-23 (×2): 2 g via INTRAVENOUS
  Filled 2018-05-22 (×5): qty 20
  Filled 2018-05-22: qty 2

## 2018-05-22 MED ORDER — GUAIFENESIN-DM 100-10 MG/5ML PO SYRP
10.0000 mL | ORAL_SOLUTION | Freq: Three times a day (TID) | ORAL | Status: AC
  Administered 2018-05-23 – 2018-05-24 (×3): 10 mL via ORAL
  Filled 2018-05-22 (×7): qty 10

## 2018-05-22 MED ORDER — ACETAMINOPHEN 325 MG PO TABS
650.0000 mg | ORAL_TABLET | Freq: Four times a day (QID) | ORAL | Status: DC | PRN
  Filled 2018-05-22: qty 2

## 2018-05-22 MED ORDER — POTASSIUM CHLORIDE IN NACL 40-0.9 MEQ/L-% IV SOLN
INTRAVENOUS | Status: AC
  Administered 2018-05-22: 100 mL/h via INTRAVENOUS
  Administered 2018-05-23: 125 mL/h via INTRAVENOUS
  Filled 2018-05-22 (×5): qty 1000

## 2018-05-22 MED ORDER — ACETAMINOPHEN 325 MG PO TABS
650.0000 mg | ORAL_TABLET | Freq: Once | ORAL | Status: AC | PRN
  Administered 2018-05-22: 650 mg via ORAL
  Filled 2018-05-22: qty 2

## 2018-05-22 NOTE — ED Notes (Signed)
Pt placed on bedpain

## 2018-05-22 NOTE — ED Notes (Signed)
Pt unable to provide urine and stool sample.

## 2018-05-22 NOTE — H&P (Addendum)
History and Physical    Larry Gates JJK:[id number] DOB: 1928-08-07 DOA: 05/22/2018  PCP: Larry Fraise, Larry Gates   Patient coming from: Home  I have personally briefly reviewed patient's old medical records in [place]  Chief Complaint: SOB, cough  HPI: Larry Gates is a 85 y.o. male with medical history significant for Atrial fibrillation/flutter, HTN , presented to the ED with complaints of difficulty breathing and productive cough of about 3 to 4 days duration.  Also with generalized weakness, feeling dizzy, cannot walk.  Patient lives alone.  Patient always reports loose stools over the past 1-2 days, without vomiting. Patient's saw his primary care provider yesterday was diagnosed with a pneumonia and prescribed azithromycin.  The patient's felt worse so presented to the ED.  ED Course: temp 103, blood pressure systolic 371- 696V.  INR 2.6.  WBC 8.4.  Normal lactic acid 1.7.  ABG pH 7.4, PCO2 33, hypoxic with O2 sat 63.  Two-view chest x-ray shows left upper and lower lobe pulmonary infiltrate.  Negative influenza panel.  Blood and urine cultures obtained.  Started on IV ceftriaxone and azithromycin.  Patient was placed on BiPAP as he was tachypneic, O2 sats 90% on RA. Hospitalist to admit for pneumonia.  Review of Systems: As per HPI other systems reviewed and negative  Past Medical History:  Diagnosis Date  . Anticoagulated on Coumadin    a. Followed @ [facility]  . Atypical atrial flutter ([place])    a. Dx 07/2010 and coumadin initiated;  b. 08/2010 s/p DCCV.  . Cataract   . Hard of hearing   . Midsternal chest pain    a. In setting of A Flutter 07/2010;  b. Cath 07/2010: nonobs dzs  . Prostate cancer ([place])   . TIA (transient ischemic attack)   . Vertigo     Past Surgical History:  Procedure Laterality Date  . APPENDECTOMY    . CARDIOVERSION  10/14/2011   Procedure: CARDIOVERSION;  Surgeon: Lelon Perla, Larry Gates;  Location: [place];  Service: Cardiovascular;  Laterality: N/A;  .  CARDIOVERSION N/A 07/24/2012   Procedure: CARDIOVERSION;  Surgeon: [name], Larry Gates;  Location: [place];  Service: Cardiovascular;  Laterality: N/A;  . EYE SURGERY Bilateral    cataract  . I&D EXTREMITY Left 05/04/2013   Procedure: IRRIGATION AND DEBRIDEMENT of left hand with repair of lacerations and revision amputation of index finger;  Surgeon: Roseanne Kaufman, Larry Gates;  Location: [place];  Service: Orthopedics;  Laterality: Left;  . TONSILLECTOMY       reports that he has never smoked. He has never used smokeless tobacco. He reports that he does not drink alcohol or use drugs.  Allergies  Allergen Reactions  . Penicillins Rash    Family History  Problem Relation Age of Onset  . CVA Father   . Alzheimer's disease Father   . Leukemia Father   . Alzheimer's disease Mother   . Heart Problems Brother        stents in his 10's-80's  . Early death Sister   . Early death Brother     Prior to Admission medications   Medication Sig Start Date End Date Taking? Authorizing Provider  amiodarone (PACERONE) 200 MG tablet TAKE 1 TABLET BY MOUTH ONCE DAILY Patient taking differently: Take 200 mg by mouth daily.  01/26/18  Yes Minus Breeding, Larry Gates  azithromycin (ZITHROMAX Z-PAK) 250 MG tablet As directed Patient taking differently: Take 250-500 mg by mouth See admin instructions. As directed starting on  05/21/2018 take 500mg  on day 1 then take 250mg  on days 2 through 5 05/21/18  Yes Hassell Done, Mary-Margaret, FNP  chlorthalidone (HYGROTON) 25 MG tablet Take 1 tablet (25 mg total) by mouth daily. 01/31/18  Yes Minus Breeding, Larry Gates  diltiazem (CARDIZEM CD) 240 MG 24 hr capsule Take 1 capsule (240 mg total) by mouth daily. 02/14/18  Yes Minus Breeding, Larry Gates  levothyroxine (SYNTHROID, LEVOTHROID) 88 MCG tablet Take 1 tablet (88 mcg total) by mouth daily before breakfast. 05/15/17  Yes Larry Fraise, Larry Gates  warfarin (COUMADIN) 2 MG tablet Take 1 tablet (2 mg total) by mouth daily. 06/02/17  Yes Memory Argue, PharmD    Physical Exam: Vitals:   05/22/18 1625 05/22/18 1630 05/22/18 1645 05/22/18 1700  BP:  115/64  111/67  Pulse:  82 80 80  Resp:  (!) 32 (!) 35 (!) 29  Temp:      TempSrc:      SpO2: 93% 94% 94% 94%  Weight:      Height:        Constitutional: On Bipap Vitals:   05/22/18 1625 05/22/18 1630 05/22/18 1645 05/22/18 1700  BP:  115/64  111/67  Pulse:  82 80 80  Resp:  (!) 32 (!) 35 (!) 29  Temp:      TempSrc:      SpO2: 93% 94% 94% 94%  Weight:      Height:       Eyes: PERRL, lids and conjunctivae normal ENMT: Mucous membranes are moist. Posterior pharynx clear of any exudate or lesions. Neck: normal, supple, no masses, no thyromegaly Respiratory: On BiPAP, equal breath sounds bilaterally, coarse Cardiovascular: Regular rate and rhythm, no murmurs / rubs / gallops. No extremity edema. 2+ pedal pulses.   Abdomen: no tenderness, no masses palpated. No hepatosplenomegaly. Bowel sounds positive.  Musculoskeletal: no clubbing / cyanosis. No joint deformity upper and lower extremities. Good ROM, no contractures. Normal muscle tone.  Skin: no rashes, lesions, ulcers. No induration Neurologic: CN 2-12 grossly intact. Strength 5/5 in all 4.  Psychiatric: Normal judgment and insight. Alert and oriented x 3. Normal mood.   Labs on Admission: I have personally reviewed following labs and imaging studies  CBC: Recent Labs  Lab 05/22/18 1513  WBC 8.4  NEUTROABS 8.0*  HGB 13.7  HCT 37.7*  MCV 87.1  PLT 767*   Basic Metabolic Panel: Recent Labs  Lab 05/22/18 1513  NA 135  K 3.2*  CL 100  CO2 20*  GLUCOSE 140*  BUN 99*  CREATININE 4.58*  CALCIUM 8.5*   GFR: Estimated Creatinine Clearance: 10.1 mL/min (A) (by C-G formula based on SCr of 4.58 mg/dL (H)). Liver Function Tests: Recent Labs  Lab 05/22/18 1513  AST 37  ALT 27  ALKPHOS 54  BILITOT 0.8  PROT 7.0  ALBUMIN 3.2*   Coagulation Profile: Recent Labs  Lab 05/22/18 1513  INR 2.69     Radiological Exams on Admission: Dg Chest 2 View  Result Date: 05/21/2018 CLINICAL DATA:  Cough EXAM: CHEST - 2 VIEW COMPARISON:  None. FINDINGS: Cardiomegaly. There is marked elevation of the left hemidiaphragm with a dense, heterogeneous opacity of the left lung base. Chronic fracture deformities of the right ribs and age-indeterminate vertebral body endplate deformities of the lower thoracic and upper lumbar spine. IMPRESSION: There is marked elevation of the left hemidiaphragm with a dense, heterogeneous opacity of the left lung base, concerning for infection or aspiration. Recommend follow-up radiographs in 6-8 weeks to ensure complete resolution  and exclude underlying malignancy. Electronically Signed   By: Eddie Candle M.D.   On: 05/21/2018 10:01   Dg Chest Portable 1 View  Result Date: 05/22/2018 CLINICAL DATA:  POSSIBLE SEPSIS. SEEN BY [facility] YESTERDAY AND DIAGNOSED WITH PNEUMONIA AND GIVEN Z-PAK. SOB MUCH WORSE TODAY.HX: NON-SMOKER, A-FIB,PNEUMONIA. EXAM: PORTABLE CHEST 1 VIEW COMPARISON:  05/21/2018 FINDINGS: Shallow lung inflation. Patient is rotated favoring the LEFT side. There is increased opacity of LEFT LOWER lobe and probable LEFT UPPER lobe infiltrate, associated with elevation of LEFT hemidiaphragm which appear stable. Numerous remote RIGHT rib fractures are identified. The RIGHT lung is otherwise clear. There is atherosclerotic calcification of the thoracic aorta. IMPRESSION: Increased density and size of LEFT pulmonary infiltrate. Suspect involvement of the LEFT UPPER lobe as well as the LEFT LOWER lobe. Stable elevation of the LEFT hemidiaphragm. Electronically Signed   By: Nolon Nations M.D.   On: 05/22/2018 16:39    EKG: Independently reviewed.  Sinus tachycardia.  QTC 462.  Assessment/Plan Active Problems:   CAP (community acquired pneumonia)    Community acquired pneumonia-SOB, cough, febrile 103, initial tachycardic, tachypneic meets sepsis criteria  without lactic acidosis.  WBC 8.4.  Port Chest xray-upper and lower lobe pneumonia. -Continue IV ceftriaxone and azithromycin started in ED -F/u blood cultures -BMP CBC a.m. -Mucolytics, DuoNebs -Continue BiPAP for now - N/s + 40KCL 100cc/hr X 15 hrs -Follow-up UA, urine culture - Swallow evaluation  Acute kidney injury-cr 4.58, baseline 1-1.2.  Likely from diarrhea over the past few days, with ongoing Chlorthalidone 25 mg daily use. -Hold home chlorthalidone -Hydrate -BMP a.m.  Diarrhea- Abdomen benign. WBC- 8.4. -Stool For C. Difficile -Hydrate.  Hypokalemia- 3.2.  Likely from recent diarrhea. - Hydrate, replete - BMP a.m  Atrial fibrillation/flutter-initial tachycardia now rate controlled.  EKG sinus tach. Therpeutic INR at 2. 6 -Continue warfarin per pharmacy -Amiodarone 200mg  and Cardizem 240 daily  DVT prophylaxis: Warfarin Code Status: Full Family Communication: Daughter in-law at bedside. Son Larry Gates would be primary Media planner. Disposition Plan: per rounding team Consults called: None Admission status: Inpt, Step down.   Bethena Roys Larry Gates [facility]  05/22/2018, 11:04 PM

## 2018-05-22 NOTE — ED Notes (Signed)
Pt was able to pivot and use BSC with one assist. Pt had BM and urinated.

## 2018-05-22 NOTE — ED Notes (Signed)
Called [facility] for IVF

## 2018-05-22 NOTE — ED Triage Notes (Signed)
DX with PNE from pcp yesterday, given zpack.  Pt feeling worse today and cant hardly get up.  Pt is usually able to ambulate without assistance.

## 2018-05-22 NOTE — ED Provider Notes (Addendum)
[facility] EMERGENCY DEPARTMENT Provider Note   CSN: [id number] Arrival date & time: 05/22/18  1430     History   Chief Complaint No chief complaint on file.   HPI Larry Gates is a 85 y.o. male.  Patient brought in by family members.  Was diagnosed with pneumonia yesterday started on Zithromax.  Today was very weak visible shortness of breath.  Patient not normally on oxygen.  Requiring 2 L of oxygen to maintain sats into the low 90%.  Significant fever to 103.  Apparently flu testing was negative.     Past Medical History:  Diagnosis Date  . Anticoagulated on Coumadin    a. Followed @ [facility]  . Atypical atrial flutter ([place])    a. Dx 07/2010 and coumadin initiated;  b. 08/2010 s/p DCCV.  . Cataract   . Hard of hearing   . Midsternal chest pain    a. In setting of A Flutter 07/2010;  b. Cath 07/2010: nonobs dzs  . Prostate cancer ([place])   . TIA (transient ischemic attack)   . Vertigo     Patient Active Problem List   Diagnosis Date Noted  . CAP (community acquired pneumonia) 05/22/2018  . Closed fracture of right proximal humerus 08/09/2017  . Long-term use of high-risk medication 01/19/2017  . Accelerated hypertension 12/08/2014  . Thyroid activity decreased 12/08/2014  . Essential hypertension 08/09/2012  . Atrial flutter ([place]) 08/30/2010  . Hyperlipidemia 08/30/2010  . Long term current use of anticoagulant therapy 08/24/2010  . Atrial fibrillation, chronic 08/17/2010    Past Surgical History:  Procedure Laterality Date  . APPENDECTOMY    . CARDIOVERSION  10/14/2011   Procedure: CARDIOVERSION;  Surgeon: Lelon Perla, MD;  Location: [place];  Service: Cardiovascular;  Laterality: N/A;  . CARDIOVERSION N/A 07/24/2012   Procedure: CARDIOVERSION;  Surgeon: [name], MD;  Location: [place];  Service: Cardiovascular;  Laterality: N/A;  . EYE SURGERY Bilateral    cataract  . I&D EXTREMITY Left 05/04/2013   Procedure: IRRIGATION AND DEBRIDEMENT of left  hand with repair of lacerations and revision amputation of index finger;  Surgeon: Roseanne Kaufman, MD;  Location: [place];  Service: Orthopedics;  Laterality: Left;  . TONSILLECTOMY          Home Medications    Prior to Admission medications   Medication Sig Start Date End Date Taking? Authorizing Provider  amiodarone (PACERONE) 200 MG tablet TAKE 1 TABLET BY MOUTH ONCE DAILY Patient taking differently: Take 200 mg by mouth daily.  01/26/18  Yes Minus Breeding, MD  azithromycin (ZITHROMAX Z-PAK) 250 MG tablet As directed Patient taking differently: Take 250-500 mg by mouth See admin instructions. As directed starting on 05/21/2018 take 573m on day 1 then take 2545mon days 2 through 5 05/21/18  Yes MaHassell DoneMary-Margaret, FNP  chlorthalidone (HYGROTON) 25 MG tablet Take 1 tablet (25 mg total) by mouth daily. 01/31/18  Yes HoMinus BreedingMD  diltiazem (CARDIZEM CD) 240 MG 24 hr capsule Take 1 capsule (240 mg total) by mouth daily. 02/14/18  Yes HoMinus BreedingMD  levothyroxine (SYNTHROID, LEVOTHROID) 88 MCG tablet Take 1 tablet (88 mcg total) by mouth daily before breakfast. 05/15/17  Yes StClaretta FraiseMD  warfarin (COUMADIN) 2 MG tablet Take 1 tablet (2 mg total) by mouth daily. 06/02/17  Yes BoMemory ArguePharmD    Family History Family History  Problem Relation Age of Onset  . CVA Father   . Alzheimer's disease Father   .  Leukemia Father   . Alzheimer's disease Mother   . Heart Problems Brother        stents in his 47's-80's  . Early death Sister   . Early death Brother     Social History Social History   Tobacco Use  . Smoking status: Never Smoker  . Smokeless tobacco: Never Used  Substance Use Topics  . Alcohol use: No  . Drug use: No     Allergies   Penicillins   Review of Systems Review of Systems  Constitutional: Positive for fatigue and fever. Negative for chills.  HENT: Positive for congestion. Negative for rhinorrhea and sore throat.   Eyes:  Negative for visual disturbance.  Respiratory: Positive for shortness of breath. Negative for cough.   Cardiovascular: Negative for chest pain and leg swelling.  Gastrointestinal: Negative for abdominal pain, diarrhea, nausea and vomiting.  Genitourinary: Negative for dysuria.  Musculoskeletal: Negative for back pain and neck pain.  Skin: Negative for rash.  Neurological: Positive for weakness. Negative for dizziness, light-headedness and headaches.  Hematological: Bruises/bleeds easily.  Psychiatric/Behavioral: Negative for confusion.     Physical Exam Updated Vital Signs BP 110/65   Pulse 71   Temp 100 F (37.8 C) (Oral)   Resp (!) 28   Ht 1.676 m (5' 6")   Wt 63 kg   SpO2 97%   BMI 22.42 kg/m   Physical Exam Vitals signs and nursing note reviewed.  Constitutional:      Appearance: He is well-developed.  HENT:     Head: Normocephalic and atraumatic.     Mouth/Throat:     Mouth: Mucous membranes are dry.  Eyes:     Conjunctiva/sclera: Conjunctivae normal.  Neck:     Musculoskeletal: Neck supple.  Cardiovascular:     Rate and Rhythm: Regular rhythm. Tachycardia present.     Heart sounds: No murmur.  Pulmonary:     Effort: Respiratory distress present.     Breath sounds: Normal breath sounds.     Comments: Decreased breath sounds bilaterally tachypneic Abdominal:     Palpations: Abdomen is soft.     Tenderness: There is no abdominal tenderness.  Musculoskeletal: Normal range of motion.        General: No swelling.  Skin:    General: Skin is warm and dry.  Neurological:     General: No focal deficit present.     Mental Status: He is alert.     Comments: Patient hard of hearing.  May have some mild dementia.  No significant focal deficits.      ED Treatments / Results  Labs (all labs ordered are listed, but only abnormal results are displayed) Labs Reviewed  COMPREHENSIVE METABOLIC PANEL - Abnormal; Notable for the following components:      Result Value     Potassium 3.2 (*)    CO2 20 (*)    Glucose, Bld 140 (*)    BUN 99 (*)    Creatinine, Ser 4.58 (*)    Calcium 8.5 (*)    Albumin 3.2 (*)    GFR calc non Af Amer 11 (*)    GFR calc Af Amer 12 (*)    All other components within normal limits  CBC WITH DIFFERENTIAL/PLATELET - Abnormal; Notable for the following components:   HCT 37.7 (*)    MCHC 36.3 (*)    Platelets 133 (*)    Neutro Abs 8.0 (*)    Lymphs Abs 0.2 (*)    All other components within  normal limits  PROTIME-INR - Abnormal; Notable for the following components:   Prothrombin Time 28.2 (*)    All other components within normal limits  BLOOD GAS, ARTERIAL - Abnormal; Notable for the following components:   pO2, Arterial 63.8 (*)    Acid-base deficit 2.4 (*)    All other components within normal limits  CULTURE, BLOOD (ROUTINE X 2)  CULTURE, BLOOD (ROUTINE X 2)  URINE CULTURE  C DIFFICILE QUICK SCREEN W PCR REFLEX  LACTIC ACID, PLASMA  LACTIC ACID, PLASMA  INFLUENZA PANEL BY PCR (TYPE A & B)  MAGNESIUM  URINALYSIS, ROUTINE W REFLEX MICROSCOPIC  BASIC METABOLIC PANEL  CBC    EKG EKG Interpretation  Date/Time:  Tuesday May 22 2018 14:52:59 EST Ventricular Rate:  114 PR Interval:    QRS Duration: 106 QT Interval:  334 QTC Calculation: 462 R Axis:   108 Text Interpretation:  Right and left arm electrode reversal, interpretation assumes no reversal Sinus tachycardia Right axis deviation Abnormal lateral Q waves Borderline ST depression, anterolateral leads Minimal ST elevation, inferior leads Baseline wander in lead(s) V6 Confirmed by Fredia Sorrow [phone]) on 05/22/2018 3:27:46 PM   Radiology Dg Chest 2 View  Result Date: 05/21/2018 CLINICAL DATA:  Cough EXAM: CHEST - 2 VIEW COMPARISON:  None. FINDINGS: Cardiomegaly. There is marked elevation of the left hemidiaphragm with a dense, heterogeneous opacity of the left lung base. Chronic fracture deformities of the right ribs and age-indeterminate vertebral  body endplate deformities of the lower thoracic and upper lumbar spine. IMPRESSION: There is marked elevation of the left hemidiaphragm with a dense, heterogeneous opacity of the left lung base, concerning for infection or aspiration. Recommend follow-up radiographs in 6-8 weeks to ensure complete resolution and exclude underlying malignancy. Electronically Signed   By: Eddie Candle M.D.   On: 05/21/2018 10:01   Dg Chest Portable 1 View  Result Date: 05/22/2018 CLINICAL DATA:  POSSIBLE SEPSIS. SEEN BY [facility] YESTERDAY AND DIAGNOSED WITH PNEUMONIA AND GIVEN Z-PAK. SOB MUCH WORSE TODAY.HX: NON-SMOKER, A-FIB,PNEUMONIA. EXAM: PORTABLE CHEST 1 VIEW COMPARISON:  05/21/2018 FINDINGS: Shallow lung inflation. Patient is rotated favoring the LEFT side. There is increased opacity of LEFT LOWER lobe and probable LEFT UPPER lobe infiltrate, associated with elevation of LEFT hemidiaphragm which appear stable. Numerous remote RIGHT rib fractures are identified. The RIGHT lung is otherwise clear. There is atherosclerotic calcification of the thoracic aorta. IMPRESSION: Increased density and size of LEFT pulmonary infiltrate. Suspect involvement of the LEFT UPPER lobe as well as the LEFT LOWER lobe. Stable elevation of the LEFT hemidiaphragm. Electronically Signed   By: Nolon Nations M.D.   On: 05/22/2018 16:39    Procedures Procedures (including critical care time)  CRITICAL CARE Performed by: Fredia Sorrow Total critical care time: 30 minutes Critical care time was exclusive of separately billable procedures and treating other patients. Critical care was necessary to treat or prevent imminent or life-threatening deterioration. Critical care was time spent personally by me on the following activities: development of treatment plan with patient and/or surrogate as well as nursing, discussions with consultants, evaluation of patient's response to treatment, examination of patient, obtaining history from  patient or surrogate, ordering and performing treatments and interventions, ordering and review of laboratory studies, ordering and review of radiographic studies, pulse oximetry and re-evaluation of patient's condition.   Medications Ordered in ED Medications  cefTRIAXone (ROCEPHIN) 2 g in sodium chloride 0.9 % 100 mL IVPB (0 g Intravenous Stopped 05/22/18 1640)  azithromycin (ZITHROMAX) 500  mg in sodium chloride 0.9 % 250 mL IVPB (500 mg Intravenous New Bag/Given 05/22/18 1640)  amiodarone (PACERONE) tablet 200 mg (has no administration in time range)  diltiazem (CARDIZEM CD) 24 hr capsule 240 mg (240 mg Oral Not Given 05/22/18 1929)  levothyroxine (SYNTHROID, LEVOTHROID) tablet 88 mcg (has no administration in time range)  acetaminophen (TYLENOL) tablet 650 mg (has no administration in time range)    Or  acetaminophen (TYLENOL) suppository 650 mg (has no administration in time range)  ondansetron (ZOFRAN) tablet 4 mg (has no administration in time range)    Or  ondansetron (ZOFRAN) injection 4 mg (has no administration in time range)  0.9 % NaCl with KCl 40 mEq / L  infusion (has no administration in time range)  warfarin (COUMADIN) tablet 2 mg (has no administration in time range)  Warfarin - Pharmacist Dosing Inpatient (has no administration in time range)  sodium chloride flush (NS) 0.9 % injection 3 mL (3 mLs Intravenous Given 05/22/18 1510)  acetaminophen (TYLENOL) tablet 650 mg (650 mg Oral Given 05/22/18 1520)  sodium chloride 0.9 % bolus 500 mL (0 mLs Intravenous Stopped 05/22/18 1625)  sodium chloride 0.9 % bolus 500 mL (500 mLs Intravenous New Bag/Given 05/22/18 1728)     Initial Impression / Assessment and Plan / ED Course  I have reviewed the triage vital signs and the nursing notes.  Pertinent labs & imaging results that were available during my care of the patient were reviewed by me and considered in my medical decision making (see chart for details).    Patient met sepsis  criteria tachycardic tachypneic febrile.  Patient with diagnosis of pneumonia yesterday chest x-ray here today shows left-sided pneumonia.  Patient had just started Zithromax yesterday.  Patient started on broad-spectrum antibiotics for community-acquired pneumonia.  No recent admission.  Since patient was tachypneic shallow breaths rapid breaths started him on BiPAP to help rest him.  Patient feeling better on that.  Initial blood gas without any significant abnormalities other than reflective of the hypoxia with sats below 90% on room air.  Patient not normally on oxygen.  Patient was never hypotensive so not given a full 30 cc/kg fluid bolus.  But patient renal function consistent with acute kidney injury.  May be prerenal.  Patient given fluids gently.  Discussed with hospitalist who will admit.   Final Clinical Impressions(s) / ED Diagnoses   Final diagnoses:  Sepsis, due to unspecified organism, unspecified whether acute organ dysfunction present [facility])  AKI (acute kidney injury) ([place])  Community acquired pneumonia of left lung, unspecified part of lung  Hypoxia    ED Discharge Orders    None       Fredia Sorrow, MD 05/22/18 2031    Fredia Sorrow, MD 05/22/18 2032

## 2018-05-23 ENCOUNTER — Inpatient Hospital Stay (HOSPITAL_COMMUNITY): Payer: Medicare Other

## 2018-05-23 DIAGNOSIS — Z992 Dependence on renal dialysis: Secondary | ICD-10-CM

## 2018-05-23 DIAGNOSIS — J181 Lobar pneumonia, unspecified organism: Secondary | ICD-10-CM

## 2018-05-23 DIAGNOSIS — N179 Acute kidney failure, unspecified: Secondary | ICD-10-CM

## 2018-05-23 DIAGNOSIS — A419 Sepsis, unspecified organism: Secondary | ICD-10-CM

## 2018-05-23 DIAGNOSIS — J9601 Acute respiratory failure with hypoxia: Secondary | ICD-10-CM

## 2018-05-23 DIAGNOSIS — N189 Chronic kidney disease, unspecified: Secondary | ICD-10-CM

## 2018-05-23 LAB — CBC
HCT: 30.9 % — ABNORMAL LOW (ref 39.0–52.0)
Hemoglobin: 11.5 g/dL — ABNORMAL LOW (ref 13.0–17.0)
MCH: 31.9 pg (ref 26.0–34.0)
MCHC: 37.2 g/dL — ABNORMAL HIGH (ref 30.0–36.0)
MCV: 85.8 fL (ref 80.0–100.0)
NRBC: 0 % (ref 0.0–0.2)
Platelets: 114 10*3/uL — ABNORMAL LOW (ref 150–400)
RBC: 3.6 MIL/uL — ABNORMAL LOW (ref 4.22–5.81)
RDW: 12.9 % (ref 11.5–15.5)
WBC: 5.9 10*3/uL (ref 4.0–10.5)

## 2018-05-23 LAB — BASIC METABOLIC PANEL
Anion gap: 12 (ref 5–15)
BUN: 100 mg/dL — ABNORMAL HIGH (ref 8–23)
CHLORIDE: 110 mmol/L (ref 98–111)
CO2: 18 mmol/L — ABNORMAL LOW (ref 22–32)
Calcium: 8 mg/dL — ABNORMAL LOW (ref 8.9–10.3)
Creatinine, Ser: 4.25 mg/dL — ABNORMAL HIGH (ref 0.61–1.24)
GFR calc Af Amer: 14 mL/min — ABNORMAL LOW (ref >60)
GFR calc non Af Amer: 12 mL/min — ABNORMAL LOW (ref >60)
Glucose, Bld: 107 mg/dL — ABNORMAL HIGH (ref 70–99)
Potassium: 3.5 mmol/L (ref 3.5–5.1)
Sodium: 140 mmol/L (ref 135–145)

## 2018-05-23 LAB — RESPIRATORY PANEL BY PCR

## 2018-05-23 LAB — CULTURE, BLOOD (ROUTINE X 2)
Culture: NO GROWTH
Special Requests: ADEQUATE

## 2018-05-23 LAB — PROTIME-INR
INR: 3.37
Prothrombin Time: 33.6 seconds — ABNORMAL HIGH (ref 11.4–15.2)

## 2018-05-23 LAB — GLUCOSE, CAPILLARY
GLUCOSE-CAPILLARY: 147 mg/dL — AB (ref 70–99)
Glucose-Capillary: 120 mg/dL — ABNORMAL HIGH (ref 70–99)
Glucose-Capillary: 162 mg/dL — ABNORMAL HIGH (ref 70–99)

## 2018-05-23 LAB — STREP PNEUMONIAE URINARY ANTIGEN: Strep Pneumo Urinary Antigen: NEGATIVE

## 2018-05-23 LAB — MRSA PCR SCREENING: MRSA by PCR: NEGATIVE

## 2018-05-23 LAB — PROCALCITONIN: Procalcitonin: 6.2 ng/mL

## 2018-05-23 MED ORDER — IPRATROPIUM-ALBUTEROL 0.5-2.5 (3) MG/3ML IN SOLN
3.0000 mL | Freq: Four times a day (QID) | RESPIRATORY_TRACT | Status: DC
  Administered 2018-05-23 – 2018-05-24 (×4): 3 mL via RESPIRATORY_TRACT
  Filled 2018-05-23 (×3): qty 3

## 2018-05-23 MED ORDER — ORAL CARE MOUTH RINSE
15.0000 mL | Freq: Two times a day (BID) | OROMUCOSAL | Status: DC
  Administered 2018-05-24 – 2018-05-26 (×3): 15 mL via OROMUCOSAL

## 2018-05-23 MED ORDER — CHLORHEXIDINE GLUCONATE 0.12 % MT SOLN
15.0000 mL | Freq: Two times a day (BID) | OROMUCOSAL | Status: DC
  Administered 2018-05-24 – 2018-05-26 (×6): 15 mL via OROMUCOSAL
  Filled 2018-05-23 (×5): qty 15

## 2018-05-23 MED ORDER — METHYLPREDNISOLONE SODIUM SUCC 125 MG IJ SOLR
60.0000 mg | Freq: Two times a day (BID) | INTRAMUSCULAR | Status: DC
  Administered 2018-05-23 – 2018-05-24 (×2): 60 mg via INTRAVENOUS
  Filled 2018-05-23 (×2): qty 2

## 2018-05-23 MED ORDER — BUDESONIDE 0.5 MG/2ML IN SUSP
0.5000 mg | Freq: Two times a day (BID) | RESPIRATORY_TRACT | Status: DC
  Administered 2018-05-23 – 2018-05-26 (×6): 0.5 mg via RESPIRATORY_TRACT
  Filled 2018-05-23 (×5): qty 2

## 2018-05-23 MED ORDER — IPRATROPIUM-ALBUTEROL 0.5-2.5 (3) MG/3ML IN SOLN
3.0000 mL | Freq: Three times a day (TID) | RESPIRATORY_TRACT | Status: DC
  Administered 2018-05-23: 3 mL via RESPIRATORY_TRACT
  Filled 2018-05-23: qty 3

## 2018-05-23 NOTE — ED Notes (Signed)
Stool sample still to be collected.

## 2018-05-23 NOTE — Progress Notes (Signed)
PROGRESS NOTE  Larry Gates TXM:[id number] DOB: Mar 18, 1929 DOA: 05/22/2018 PCP: Claretta Fraise, MD  Brief History:  85 year old male with a history of atrial flutter on warfarin, prostate cancer, TIA, hypertension, hyperlipidemia presenting with confusion, increasing generalized weakness, increasing shortness of breath.  His symptoms began on 05/18/18 with cough and chest congestion.  His symptoms progressoved over the next 1-2 days with worsening cough, sob, and increasing generalized weakness. Patient went to see his primary care provider on 05/21/2018.  The patient was diagnosed with pneumonia and started on a azithromycin.  Unfortunately, the patient continued to have increasing cough and shortness of breath.  In addition, the patient has been having loose stools for the past 2 days without hematochezia or melena.  At baseline, the patient lives by himself, but he was not able to get up out of bed because of generalized weakness.  As a result, the patient was brought to the emergency department for further evaluation.  In the ED, the patient was febrile to 103.0 F and tachycardic.  He was hemodynamically stable.  The patient was placed on BiPAP due to increased work of breathing.  He was started on ceftriaxone and azithromycin.  Assessment/Plan: Sepsis -Present at time of admission -Patient presented with fever, tachycardia, and respiratory failure -Secondary to pneumonia  -Lactic acid peaked 1.7 -Continue empiric ceftriaxone and azithromycin pending culture data -Follow blood cultures -Urinalysis negative for pyuria -Check procalcitonin -Influenza PCR negative  Acute respiratory failure with hypoxia -Patient has been requiring BiPAP -Wean BiPAP as tolerated  -Secondary to pneumonia -Personally reviewed chest x-ray--LLL consolidation  Lobar pneumonia -Urine Legionella antigen -Urine Streptococcus pneumoniae antigen -Continue empiric ceftriaxone and azithromycin -Personally  reviewed chest x-ray--LLL consolidation -Continue duo nebs  Acute on chronic renal failure--CKD stage III -Baseline creatinine 0.9-1.2 -Secondary to sepsis and volume depletion -serum creatinine peaked 4.58 -Continue IV fluids -A.m. BMP -renal US  Atrial flutter -Presently in sinus rhythm -Continue amiodarone -Continue diltiazem CD -Continue warfarin  Essential Hypertension -holding chlorthalidone temporarily  Hypokalemia -Replete -mag 2.0  Hypothyroidism -Continue Synthroid  Hyponatremia -due to volume depletion -continue IVF  Diarrhea -no BM since admission -d/c cdiff  Disposition Plan:   Anticipate SNF 2-3 days Family Communication:   Daughter in Sports coach updated at bedside  Consultants:  none  Code Status:  FULL   DVT Prophylaxis:  Warfarin   Procedures: As Listed in Progress Note Above  Antibiotics: Ceftriaxone 2/4>>> azithro 2/4>>>    Subjective: Pt states breathing is a little better although he remains dyspneic at rest.  Denies headache, cp, n/v/d, abd pain, headache.  No rashes, no dysuria  Objective: Vitals:   05/23/18 0700 05/23/18 0802 05/23/18 0806 05/23/18 0811  BP: 140/68     Pulse: 89  88 89  Resp: (!) 22   (!) 27  Temp:    98 F (36.7 C)  TempSrc:    Axillary  SpO2: 97% 97% 99% 98%  Weight:      Height:        Intake/Output Summary (Last 24 hours) at 05/23/2018 0933 Last data filed at 05/23/2018 0600 Gross per 24 hour  Intake 1667.32 ml  Output 151 ml  Net 1516.32 ml   Weight change:  Exam:   General:  Pt is alert, follows commands appropriately, not in mild distress  HEENT: No icterus, No thrush, No neck mass, [place]/AT  Cardiovascular: RRR, S1/S2, no rubs, no gallops  Respiratory: bilateral rhonchi and wheezing  Abdomen: Soft/+BS, non tender, non distended, no guarding  Extremities: No edema, No lymphangitis, No petechiae, No rashes, no synovitis   Data Reviewed: I have personally reviewed following labs and imaging  studies Basic Metabolic Panel: Recent Labs  Lab 05/22/18 1513 05/23/18 0443  NA 135 140  K 3.2* 3.5  CL 100 110  CO2 20* 18*  GLUCOSE 140* 107*  BUN 99* 100*  CREATININE 4.58* 4.25*  CALCIUM 8.5* 8.0*  MG 2.0  --    Liver Function Tests: Recent Labs  Lab 05/22/18 1513  AST 37  ALT 27  ALKPHOS 54  BILITOT 0.8  PROT 7.0  ALBUMIN 3.2*   No results for input(s): LIPASE, AMYLASE in the last 168 hours. No results for input(s): AMMONIA in the last 168 hours. Coagulation Profile: Recent Labs  Lab 05/22/18 1513 05/23/18 0816  INR 2.69 3.37   CBC: Recent Labs  Lab 05/22/18 1513 05/23/18 0443  WBC 8.4 5.9  NEUTROABS 8.0*  --   HGB 13.7 11.5*  HCT 37.7* 30.9*  MCV 87.1 85.8  PLT 133* 114*   Cardiac Enzymes: No results for input(s): CKTOTAL, CKMB, CKMBINDEX, TROPONINI in the last 168 hours. BNP: Invalid input(s): POCBNP CBG: Recent Labs  Lab 05/23/18 0805  GLUCAP 120*   HbA1C: No results for input(s): HGBA1C in the last 72 hours. Urine analysis:    Component Value Date/Time   COLORURINE YELLOW 05/22/2018 1456   APPEARANCEUR HAZY (A) 05/22/2018 1456   LABSPEC 1.012 05/22/2018 1456   PHURINE 5.0 05/22/2018 1456   GLUCOSEU NEGATIVE 05/22/2018 1456   HGBUR MODERATE (A) 05/22/2018 1456   BILIRUBINUR NEGATIVE 05/22/2018 1456   KETONESUR NEGATIVE 05/22/2018 1456   PROTEINUR 30 (A) 05/22/2018 1456   NITRITE NEGATIVE 05/22/2018 1456   LEUKOCYTESUR NEGATIVE 05/22/2018 1456   Sepsis Labs: @LABRCNTIP (procalcitonin:4,lacticidven:4) ) Recent Results (from the past 240 hour(s))  Culture, blood (Routine x 2)     Status: None   Collection Time: 05/22/18  3:13 PM  Result Value Ref Range Status   Specimen Description BLOOD LEFT FOREARM  Final   Special Requests   Final    BOTTLES DRAWN AEROBIC AND ANAEROBIC Blood Culture adequate volume   Culture   Final    NO GROWTH 84 DAYS Performed at [facility], [address] [place], [place] [address]    Report Status  05/23/2018 FINAL  Final  Culture, blood (Routine x 2)     Status: None (Preliminary result)   Collection Time: 05/22/18  3:15 PM  Result Value Ref Range Status   Specimen Description BLOOD RIGHT WRIST  Final   Special Requests   Final    BOTTLES DRAWN AEROBIC AND ANAEROBIC Blood Culture adequate volume   Culture   Final    NO GROWTH < 24 HOURS Performed at [facility], [address] [place], [place] [address]    Report Status PENDING  Incomplete  MRSA PCR Screening     Status: None   Collection Time: 05/23/18 12:35 AM  Result Value Ref Range Status   MRSA by PCR NEGATIVE NEGATIVE Final    Comment:        The GeneXpert MRSA Assay (FDA approved for NASAL specimens only), is one component of a comprehensive MRSA colonization surveillance program. It is not intended to diagnose MRSA infection nor to guide or monitor treatment for MRSA infections. Performed at [facility], [address] [place], [place] [address]      Scheduled Meds: . amiodarone  200 mg Oral  Daily  . diltiazem  240 mg Oral Daily  . guaiFENesin-dextromethorphan  10 mL Oral Q8H  . ipratropium-albuterol  3 mL Nebulization TID  . levothyroxine  88 mcg Oral Q0600  . Warfarin - Pharmacist Dosing Inpatient   Does not apply q1800   Continuous Infusions: . 0.9 % NaCl with KCl 40 mEq / L 125 mL/hr (05/23/18 0422)  . azithromycin 500 mg (05/22/18 1640)  . cefTRIAXone (ROCEPHIN)  IV Stopped (05/22/18 1640)    Procedures/Studies: Dg Chest 2 View  Result Date: 05/21/2018 CLINICAL DATA:  Cough EXAM: CHEST - 2 VIEW COMPARISON:  None. FINDINGS: Cardiomegaly. There is marked elevation of the left hemidiaphragm with a dense, heterogeneous opacity of the left lung base. Chronic fracture deformities of the right ribs and age-indeterminate vertebral body endplate deformities of the lower thoracic and upper lumbar spine. IMPRESSION: There is marked elevation of the left hemidiaphragm with a dense, heterogeneous opacity of the  left lung base, concerning for infection or aspiration. Recommend follow-up radiographs in 6-8 weeks to ensure complete resolution and exclude underlying malignancy. Electronically Signed   By: Eddie Candle M.D.   On: 05/21/2018 10:01   Dg Chest Portable 1 View  Result Date: 05/22/2018 CLINICAL DATA:  POSSIBLE SEPSIS. SEEN BY [facility] YESTERDAY AND DIAGNOSED WITH PNEUMONIA AND GIVEN Z-PAK. SOB MUCH WORSE TODAY.HX: NON-SMOKER, A-FIB,PNEUMONIA. EXAM: PORTABLE CHEST 1 VIEW COMPARISON:  05/21/2018 FINDINGS: Shallow lung inflation. Patient is rotated favoring the LEFT side. There is increased opacity of LEFT LOWER lobe and probable LEFT UPPER lobe infiltrate, associated with elevation of LEFT hemidiaphragm which appear stable. Numerous remote RIGHT rib fractures are identified. The RIGHT lung is otherwise clear. There is atherosclerotic calcification of the thoracic aorta. IMPRESSION: Increased density and size of LEFT pulmonary infiltrate. Suspect involvement of the LEFT UPPER lobe as well as the LEFT LOWER lobe. Stable elevation of the LEFT hemidiaphragm. Electronically Signed   By: Nolon Nations M.D.   On: 05/22/2018 16:39    Orson Eva, DO  [facility] Pager [phone]  If 7PM-7AM, please contact night-coverage [email or web address] Password [email or web address] 05/23/2018, 9:33 AM   LOS: 1 day

## 2018-05-23 NOTE — Progress Notes (Signed)
ANTICOAGULATION CONSULT NOTE - Initial Consult  Pharmacy Consult for warfarin Indication: atrial fibrillation  Allergies  Allergen Reactions  . Penicillins Rash    Patient Measurements: Height: 5\' 6"  (167.6 cm) Weight: 141 lb 8.6 oz (64.2 kg) IBW/kg (Calculated) : 63.8   Vital Signs: Temp: 98 F (36.7 C) (02/05 0811) Temp Source: Axillary (02/05 0811) BP: 140/68 (02/05 0700) Pulse Rate: 89 (02/05 0811)  Labs: Recent Labs    05/22/18 1513 05/23/18 0443 05/23/18 0816  HGB 13.7 11.5*  --   HCT 37.7* 30.9*  --   PLT 133* 114*  --   LABPROT 28.2*  --  33.6*  INR 2.69  --  3.37  CREATININE 4.58* 4.25*  --     Estimated Creatinine Clearance: 11.1 mL/min (A) (by C-G formula based on SCr of 4.25 mg/dL (H)).   Medical History: Past Medical History:  Diagnosis Date  . Anticoagulated on Coumadin    a. Followed @ [facility]  . Atypical atrial flutter ([place])    a. Dx 07/2010 and coumadin initiated;  b. 08/2010 s/p DCCV.  . Cataract   . Hard of hearing   . Midsternal chest pain    a. In setting of A Flutter 07/2010;  b. Cath 07/2010: nonobs dzs  . Prostate cancer ([employer])   . TIA (transient ischemic attack)   . Vertigo     Medications:  Medications Prior to Admission  Medication Sig Dispense Refill Last Dose  . amiodarone (PACERONE) 200 MG tablet TAKE 1 TABLET BY MOUTH ONCE DAILY (Patient taking differently: Take 200 mg by mouth daily. ) 30 tablet 9 05/21/2018 at Unknown time  . azithromycin (ZITHROMAX Z-PAK) 250 MG tablet As directed (Patient taking differently: Take 250-500 mg by mouth See admin instructions. As directed starting on 05/21/2018 take 500mg  on day 1 then take 250mg  on days 2 through 5) 6 tablet 0 05/21/2018 at Unknown time  . chlorthalidone (HYGROTON) 25 MG tablet Take 1 tablet (25 mg total) by mouth daily. 90 tablet 3 05/21/2018 at Unknown time  . diltiazem (CARDIZEM CD) 240 MG 24 hr capsule Take 1 capsule (240 mg total) by mouth daily. 30 capsule 6 05/21/2018 at Unknown  time  . levothyroxine (SYNTHROID, LEVOTHROID) 88 MCG tablet Take 1 tablet (88 mcg total) by mouth daily before breakfast. 90 tablet 3 05/21/2018 at Unknown time  . warfarin (COUMADIN) 2 MG tablet Take 1 tablet (2 mg total) by mouth daily. 90 tablet 3 05/20/2018 at Unknown time    Assessment: Pharmacy consulted to dose warfarin in patient with atrial fibrillation. INR is supratherapeutic today at  3.37.  Home dose is listed as warfarin 2 mg daily.  Goal of Therapy:  INR 2-3 Monitor platelets by anticoagulation protocol: Yes   Plan:  Hold warfarin x 1 dose. Monitor labs and s/s of bleeding.  Revonda Standard Annjanette Wertenberger 05/23/2018,10:44 AM

## 2018-05-23 NOTE — Evaluation (Signed)
Clinical/Bedside Swallow Evaluation Patient Details  Name: Larry Gates MRN: [id number] Date of Birth: 03-Mar-1929  Today's Date: 05/23/2018 Time: SLP Start Time (ACUTE ONLY): 0900 SLP Stop Time (ACUTE ONLY): 0930 SLP Time Calculation (min) (ACUTE ONLY): 30 min  Past Medical History:  Past Medical History:  Diagnosis Date  . Anticoagulated on Coumadin    a. Followed @ [facility]  . Atypical atrial flutter ([place])    a. Dx 07/2010 and coumadin initiated;  b. 08/2010 s/p DCCV.  . Cataract   . Hard of hearing   . Midsternal chest pain    a. In setting of A Flutter 07/2010;  b. Cath 07/2010: nonobs dzs  . Prostate cancer ([place])   . TIA (transient ischemic attack)   . Vertigo    Past Surgical History:  Past Surgical History:  Procedure Laterality Date  . APPENDECTOMY    . CARDIOVERSION  10/14/2011   Procedure: CARDIOVERSION;  Surgeon: Lelon Perla, MD;  Location: [facility];  Service: Cardiovascular;  Laterality: N/A;  . CARDIOVERSION N/A 07/24/2012   Procedure: CARDIOVERSION;  Surgeon: [name], MD;  Location: [facility];  Service: Cardiovascular;  Laterality: N/A;  . EYE SURGERY Bilateral    cataract  . I&D EXTREMITY Left 05/04/2013   Procedure: IRRIGATION AND DEBRIDEMENT of left hand with repair of lacerations and revision amputation of index finger;  Surgeon: Roseanne Kaufman, MD;  Location: [facility];  Service: Orthopedics;  Laterality: Left;  . TONSILLECTOMY     HPI:  Larry Gates is a 85 y.o. male with medical history significant for Atrial fibrillation/flutter, HTN , presented to the ED with complaints of difficulty breathing and productive cough of about 3 to 4 days duration.  Also with generalized weakness, feeling dizzy, cannot walk.  Patient lives alone.  Patient always reports loose stools over the past 1-2 days, without vomiting. Patient's saw his primary care provider yesterday was diagnosed with a pneumonia and prescribed azithromycin.  The patient's felt worse so presented to the  ED. ED Course: temp 103, blood pressure systolic 419- 379K.  INR 2.6.  WBC 8.4.  Normal lactic acid 1.7.  ABG pH 7.4, PCO2 33, hypoxic with O2 sat 63.  Two-view chest x-ray shows left upper and lower lobe pulmonary infiltrate.  Negative influenza panel.  Blood and urine cultures obtained.  Started on IV ceftriaxone and azithromycin.  Patient was placed on BiPAP as he was tachypneic, O2 sats 90% on RA. Hospitalist to admit for pneumonia. BSE requested.   Assessment / Plan / Recommendation Clinical Impression  Clinical swallow evaluation completed at bedside with family present. Upon SLP arrival, Pt's daughter-in-law was feeding Pt breakfast meal. Pt reportedly recently take off of Bi-PAP. Pt appeared to be short of breath and working hard to breathe with fluctuating O2 saturation and elevated RR. Pt also holding small basin and expectorating secretions and grits repeatedly with intermittent congested cough. Pt without facial asymmetry, but demonstrated impaired mastication and lingual control with solids resulting in oral residuals and eventual expectoration. It is difficult to determine if chest congestion and coughing is directly related to po intake at this time, however Pt is at high risk for aspiration given compromised respiratory status and work of breathing to coordinate with swallow.   Pt continues to request food despite, obvious difficulty with solid foods. Pt with congested cough throughout the evaluation. Would consider making Pt NPO except for meds in puree and small sips of water/ice chips after oral care vs D1/puree with thin liquids  once he is able to tolerate being off Bi-PAP. Discussed with Dr. Carles Collet and will defer to him once he sees Pt. Education regarding swallow/respiration reciprocity and risk for aspiration completed with family. SLP will follow.  SLP Visit Diagnosis: Dysphagia, unspecified (R13.10)    Aspiration Risk  Moderate aspiration risk    Diet Recommendation NPO;Dysphagia 1  (Puree);Thin liquid;NPO except meds;Ice chips PRN after oral care   Liquid Administration via: Cup Medication Administration: Whole meds with puree Supervision: Full supervision/cueing for compensatory strategies Compensations: Slow rate;Small sips/bites;Lingual sweep for clearance of pocketing;Multiple dry swallows after each bite/sip Postural Changes: Seated upright at 90 degrees;Remain upright for at least 30 minutes after po intake    Other  Recommendations Oral Care Recommendations: Oral care QID;Staff/trained caregiver to provide oral care;Oral care prior to ice chip/H20 Other Recommendations: Have oral suction available;Clarify dietary restrictions   Follow up Recommendations Skilled Nursing facility      Frequency and Duration min 2x/week  1 week       Prognosis Prognosis for Safe Diet Advancement: Guarded Barriers to Reach Goals: (respiratory status)      Swallow Study   General Date of Onset: 05/22/18 HPI: Larry Gates is a 85 y.o. male with medical history significant for Atrial fibrillation/flutter, HTN , presented to the ED with complaints of difficulty breathing and productive cough of about 3 to 4 days duration.  Also with generalized weakness, feeling dizzy, cannot walk.  Patient lives alone.  Patient always reports loose stools over the past 1-2 days, without vomiting. Patient's saw his primary care provider yesterday was diagnosed with a pneumonia and prescribed azithromycin.  The patient's felt worse so presented to the ED. ED Course: temp 103, blood pressure systolic 650- 354S.  INR 2.6.  WBC 8.4.  Normal lactic acid 1.7.  ABG pH 7.4, PCO2 33, hypoxic with O2 sat 63.  Two-view chest x-ray shows left upper and lower lobe pulmonary infiltrate.  Negative influenza panel.  Blood and urine cultures obtained.  Started on IV ceftriaxone and azithromycin.  Patient was placed on BiPAP as he was tachypneic, O2 sats 90% on RA. Hospitalist to admit for pneumonia. BSE  requested. Type of Study: Bedside Swallow Evaluation Previous Swallow Assessment: None on record Diet Prior to this Study: Regular;Thin liquids(heart healthy) Temperature Spikes Noted: No Respiratory Status: Nasal cannula(was on Bi-PAP this AM) History of Recent Intubation: No Behavior/Cognition: Alert;Cooperative;Pleasant mood;Requires cueing(HOH) Oral Cavity Assessment: (solid food residuals as family feeding breakfast) Oral Care Completed by SLP: No Oral Cavity - Dentition: Adequate natural dentition;Missing dentition Vision: Functional for self-feeding Self-Feeding Abilities: Able to feed self;Needs assist;Needs set up Patient Positioning: Upright in bed Baseline Vocal Quality: Normal Volitional Cough: Congested Volitional Swallow: Able to elicit    Oral/Motor/Sensory Function Overall Oral Motor/Sensory Function: Within functional limits   Ice Chips Ice chips: Not tested   Thin Liquid Thin Liquid: Impaired Presentation: Cup;Straw Oral Phase Impairments: Reduced lingual movement/coordination Pharyngeal  Phase Impairments: Suspected delayed Swallow;Cough - Immediate;Cough - Delayed    Nectar Thick Nectar Thick Liquid: Not tested   Honey Thick Honey Thick Liquid: Not tested   Puree Puree: Impaired Presentation: Spoon Oral Phase Functional Implications: Prolonged oral transit Pharyngeal Phase Impairments: Suspected delayed Swallow   Solid     Solid: Impaired Presentation: Spoon Oral Phase Impairments: Reduced lingual movement/coordination Oral Phase Functional Implications: Prolonged oral transit;Impaired mastication;Oral residue Pharyngeal Phase Impairments: Cough - Delayed     Thank you,  Genene Churn, [place]  , 05/23/2018,11:45 AM

## 2018-05-23 NOTE — Progress Notes (Signed)
Removed from Bipap around 0815 this am patient was somewhat comfortable. RR still in the mid to high 20s. His BBS were rhonci with exp. Wheeze. He coughed up some pale red/pinkish thick sputum. I had to replace him back on Bipap around 1055 due to increased work of breathing. After placement, patient seemed comfortable again and resting. RT will cont to check on patient.

## 2018-05-24 ENCOUNTER — Other Ambulatory Visit: Payer: Self-pay

## 2018-05-24 ENCOUNTER — Inpatient Hospital Stay (HOSPITAL_COMMUNITY): Payer: Medicare Other

## 2018-05-24 DIAGNOSIS — J181 Lobar pneumonia, unspecified organism: Secondary | ICD-10-CM

## 2018-05-24 DIAGNOSIS — E876 Hypokalemia: Secondary | ICD-10-CM

## 2018-05-24 LAB — BASIC METABOLIC PANEL
Anion gap: 12 (ref 5–15)
BUN: 104 mg/dL — ABNORMAL HIGH (ref 8–23)
CO2: 18 mmol/L — ABNORMAL LOW (ref 22–32)
Calcium: 8.9 mg/dL (ref 8.9–10.3)
Chloride: 113 mmol/L — ABNORMAL HIGH (ref 98–111)
Creatinine, Ser: 3.88 mg/dL — ABNORMAL HIGH (ref 0.61–1.24)
GFR calc Af Amer: 15 mL/min — ABNORMAL LOW (ref >60)
GFR calc non Af Amer: 13 mL/min — ABNORMAL LOW (ref >60)
Glucose, Bld: 196 mg/dL — ABNORMAL HIGH (ref 70–99)
Potassium: 3 mmol/L — ABNORMAL LOW (ref 3.5–5.1)
Sodium: 143 mmol/L (ref 135–145)

## 2018-05-24 LAB — CBC
HCT: 30.4 % — ABNORMAL LOW (ref 39.0–52.0)
HEMOGLOBIN: 11.2 g/dL — AB (ref 13.0–17.0)
MCH: 32.1 pg (ref 26.0–34.0)
MCHC: 36.8 g/dL — ABNORMAL HIGH (ref 30.0–36.0)
MCV: 87.1 fL (ref 80.0–100.0)
Platelets: 136 10*3/uL — ABNORMAL LOW (ref 150–400)
RBC: 3.49 MIL/uL — ABNORMAL LOW (ref 4.22–5.81)
RDW: 12.8 % (ref 11.5–15.5)
WBC: 3.3 10*3/uL — AB (ref 4.0–10.5)
nRBC: 0 % (ref 0.0–0.2)

## 2018-05-24 LAB — PROTIME-INR
INR: 5.4
Prothrombin Time: 48.5 seconds — ABNORMAL HIGH (ref 11.4–15.2)

## 2018-05-24 LAB — URINE CULTURE: Culture: NO GROWTH

## 2018-05-24 LAB — MAGNESIUM: Magnesium: 2.3 mg/dL (ref 1.7–2.4)

## 2018-05-24 LAB — PROCALCITONIN: Procalcitonin: 4.47 ng/mL

## 2018-05-24 MED ORDER — METHYLPREDNISOLONE SODIUM SUCC 125 MG IJ SOLR
60.0000 mg | Freq: Three times a day (TID) | INTRAMUSCULAR | Status: DC
  Administered 2018-05-24 – 2018-05-26 (×7): 60 mg via INTRAVENOUS
  Filled 2018-05-24 (×7): qty 2

## 2018-05-24 MED ORDER — IPRATROPIUM-ALBUTEROL 0.5-2.5 (3) MG/3ML IN SOLN
3.0000 mL | RESPIRATORY_TRACT | Status: DC
  Administered 2018-05-24 – 2018-05-26 (×12): 3 mL via RESPIRATORY_TRACT
  Filled 2018-05-24 (×13): qty 3

## 2018-05-24 MED ORDER — POTASSIUM CHLORIDE CRYS ER 20 MEQ PO TBCR
40.0000 meq | EXTENDED_RELEASE_TABLET | ORAL | Status: AC
  Administered 2018-05-24 (×2): 40 meq via ORAL
  Filled 2018-05-24 (×2): qty 2

## 2018-05-24 MED ORDER — GUAIFENESIN ER 600 MG PO TB12
600.0000 mg | ORAL_TABLET | Freq: Two times a day (BID) | ORAL | Status: DC
  Administered 2018-05-24 – 2018-05-25 (×3): 600 mg via ORAL
  Filled 2018-05-24 (×4): qty 1

## 2018-05-24 MED ORDER — GUAIFENESIN-DM 100-10 MG/5ML PO SYRP
5.0000 mL | ORAL_SOLUTION | ORAL | Status: DC | PRN
  Filled 2018-05-24: qty 5

## 2018-05-24 NOTE — Progress Notes (Signed)
[facility] for warfarin Indication: atrial fibrillation  Allergies  Allergen Reactions  . Penicillins Rash    Patient Measurements: Height: 5\' 6"  (167.6 cm) Weight: 140 lb 6.9 oz (63.7 kg) IBW/kg (Calculated) : 63.8   Vital Signs: Temp: 97.5 F (36.4 C) (02/06 0741) Temp Source: Axillary (02/06 0741) BP: 124/64 (02/06 0600) Pulse Rate: 77 (02/06 0741)  Labs: Recent Labs    05/22/18 1513 05/23/18 0443 05/23/18 0816 05/24/18 0426  HGB 13.7 11.5*  --  11.2*  HCT 37.7* 30.9*  --  30.4*  PLT 133* 114*  --  136*  LABPROT 28.2*  --  33.6* 48.5*  INR 2.69  --  3.37 5.40*  CREATININE 4.58* 4.25*  --  3.88*    Estimated Creatinine Clearance: 12.1 mL/min (A) (by C-G formula based on SCr of 3.88 mg/dL (H)).   Medical History: Past Medical History:  Diagnosis Date  . Anticoagulated on Coumadin    a. Followed @ [facility]  . Atypical atrial flutter ([place])    a. Dx 07/2010 and coumadin initiated;  b. 08/2010 s/p DCCV.  . Cataract   . Hard of hearing   . Midsternal chest pain    a. In setting of A Flutter 07/2010;  b. Cath 07/2010: nonobs dzs  . Prostate cancer ([place])   . TIA (transient ischemic attack)   . Vertigo     Medications:  Medications Prior to Admission  Medication Sig Dispense Refill Last Dose  . amiodarone (PACERONE) 200 MG tablet TAKE 1 TABLET BY MOUTH ONCE DAILY (Patient taking differently: Take 200 mg by mouth daily. ) 30 tablet 9 05/21/2018 at Unknown time  . azithromycin (ZITHROMAX Z-PAK) 250 MG tablet As directed (Patient taking differently: Take 250-500 mg by mouth See admin instructions. As directed starting on 05/21/2018 take 500mg  on day 1 then take 250mg  on days 2 through 5) 6 tablet 0 05/21/2018 at Unknown time  . chlorthalidone (HYGROTON) 25 MG tablet Take 1 tablet (25 mg total) by mouth daily. 90 tablet 3 05/21/2018 at Unknown time  . diltiazem (CARDIZEM CD) 240 MG 24 hr capsule Take 1 capsule (240 mg total) by mouth daily.  30 capsule 6 05/21/2018 at Unknown time  . levothyroxine (SYNTHROID, LEVOTHROID) 88 MCG tablet Take 1 tablet (88 mcg total) by mouth daily before breakfast. 90 tablet 3 05/21/2018 at Unknown time  . warfarin (COUMADIN) 2 MG tablet Take 1 tablet (2 mg total) by mouth daily. 90 tablet 3 05/20/2018 at Unknown time    Assessment: Pharmacy consulted to dose warfarin in patient with atrial fibrillation. INR is supratherapeutic today at  5.40.  Home dose is listed as warfarin 2 mg daily.  Goal of Therapy:  INR 2-3 Monitor platelets by anticoagulation protocol: Yes   Plan:  Hold warfarin x 1 dose. Monitor labs and s/s of bleeding.  Larry Gates 05/24/2018,9:08 AM

## 2018-05-24 NOTE — Progress Notes (Signed)
PROGRESS NOTE                                                                                                                                                                                                             Patient Demographics:    Larry Gates, is a 85 y.o. male, DOB - [birth year], XMI:[id number]  Admit date - 05/22/2018   Admitting Physician Courage Denton Brick, MD  Outpatient Primary MD for the patient is Claretta Fraise, MD  LOS - 2  Outpatient Specialists: None  No chief complaint on file.      Brief Narrative 85 year old male with a flutter on warfarin, prostate cancer, TIA, hypertension, hyperlipidemia presenting with confusion, generalized weakness, increasing shortness of breath since 1/31 when symptoms started with cough and chest congestion.  Patient seen by his PCP on 2/3 and was diagnosed with pneumonia and started on azithromycin.  However symptoms continue to worsen.  He also had loose stools for 2 days prior to admission with progressive generalized weakness. In the ED he was septic with fever of 103 F, tachycardic and hypoxic requiring BiPAP due to increased work of breathing. Admitted to stepdown unit.   Subjective:   Patient reports still having productive cough with increasing shortness of breath.   Assessment  & Plan :   Principal problem Sepsis ([place]) Now afebrile but still requiring 4 L via nasal cannula.  Continue empiric Rocephin and azithromycin.  Cultures so far negative.  Flu PCR and respiratory viral panel negative. Continue IV fluids.  Acute respiratory failure with hypoxia ([facility]) Secondary to left lobar pneumonia.  Weaned off BiPAP and on 4 L via nasal cannula.  Suspect he would desaturate further if he ambulates.  Continue Rocephin and azithromycin.  Urine strep pneumonia antigen negative, Legionella pending. Will increase IV Solu-Medrol to 60 mg every 8 hours and increased scheduled  DuoNeb to every 4 hours.  Patient actively wheezing. Follow-up chest x-ray today shows stable left-sided infiltrate.  If no improvement will require increasing work of breathing will consult pulmonary.  Acute kidney injury ([place]) Likely prerenal ATN with sepsis.  Continue IV hydration.  Renal ultrasound suggestive of medical renal disease (baseline creatinine however less than sign 1). Avoid nephrotoxins and monitor.  Renal function slowly trending down.  Monitor I's/O.  A flutter with supratherapeutic INR Possibly due to antibiotic use and  sepsis.  Holding Coumadin for now.  Monitor INR closely. Rate control with Cardizem.  Continue amiodarone.  Hypothyroidism Continue Synthroid.  TSH normal.  Essential hypertension Chlorthalidone on hold.  Hypokalemia Replenished.  Magnesium normal  Diarrhea Resolved.  Continue stepdown monitoring given active respiratory failure.  Code Status : Full code  Family Communication  : Son at bedside  Disposition Plan  : Possibly needs SNF  Barriers For Discharge : Active symptoms  Consults  : None  Procedures  : Renal ultrasound  DVT Prophylaxis  : Supratherapeutic INR  Lab Results  Component Value Date   PLT 136 (L) 05/24/2018    Antibiotics  :   Anti-infectives (From admission, onward)   Start     Dose/Rate Route Frequency Ordered Stop   05/22/18 1545  cefTRIAXone (ROCEPHIN) 2 g in sodium chloride 0.9 % 100 mL IVPB     2 g 200 mL/hr over 30 Minutes Intravenous Every 24 hours 05/22/18 1532     05/22/18 1545  azithromycin (ZITHROMAX) 500 mg in sodium chloride 0.9 % 250 mL IVPB     500 mg 250 mL/hr over 60 Minutes Intravenous Every 24 hours 05/22/18 1532          Objective:   Vitals:   05/24/18 0859 05/24/18 0904 05/24/18 1130 05/24/18 1200  BP:    126/74  Pulse:   77 77  Resp:      Temp:   (!) 97.2 F (36.2 C)   TempSrc:   Oral   SpO2: 93% 95% 93% 96%  Weight:      Height:        Wt Readings from Last 3 Encounters:    05/24/18 63.7 kg  05/21/18 63.5 kg  02/14/18 62 kg     Intake/Output Summary (Last 24 hours) at 05/24/2018 1444 Last data filed at 05/24/2018 1201 Gross per 24 hour  Intake 500 ml  Output 600 ml  Net -100 ml     Physical Exam  Gen: Fatigued, not in acute distress HEENT: Moist mucosa, supple neck, no JVD Chest: Diffuse wheezing bilaterally, CVS: Normal S1-S2, no murmur rub or gallop GI: Soft, nondistended, nontender, bowel sounds present Musculoskeletal: Warm, no edema CNs: Hard of hearing, alert and oriented     Data Review:    CBC Recent Labs  Lab 05/22/18 1513 05/23/18 0443 05/24/18 0426  WBC 8.4 5.9 3.3*  HGB 13.7 11.5* 11.2*  HCT 37.7* 30.9* 30.4*  PLT 133* 114* 136*  MCV 87.1 85.8 87.1  MCH 31.6 31.9 32.1  MCHC 36.3* 37.2* 36.8*  RDW 13.2 12.9 12.8  LYMPHSABS 0.2*  --   --   MONOABS 0.2  --   --   EOSABS 0.0  --   --   BASOSABS 0.0  --   --     Chemistries  Recent Labs  Lab 05/22/18 1513 05/23/18 0443 05/24/18 0426  NA 135 140 143  K 3.2* 3.5 3.0*  CL 100 110 113*  CO2 20* 18* 18*  GLUCOSE 140* 107* 196*  BUN 99* 100* 104*  CREATININE 4.58* 4.25* 3.88*  CALCIUM 8.5* 8.0* 8.9  MG 2.0  --  2.3  AST 37  --   --   ALT 27  --   --   ALKPHOS 54  --   --   BILITOT 0.8  --   --    ------------------------------------------------------------------------------------------------------------------ No results for input(s): CHOL, HDL, LDLCALC, TRIG, CHOLHDL, LDLDIRECT in the last 72 hours.  Lab Results  Component  Value Date   HGBA1C 4.3 04/01/2016   ------------------------------------------------------------------------------------------------------------------ No results for input(s): TSH, T4TOTAL, T3FREE, THYROIDAB in the last 72 hours.  Invalid input(s): FREET3 ------------------------------------------------------------------------------------------------------------------ No results for input(s): VITAMINB12, FOLATE, FERRITIN, TIBC, IRON,  RETICCTPCT in the last 72 hours.  Coagulation profile Recent Labs  Lab 05/22/18 1513 05/23/18 0816 05/24/18 0426  INR 2.69 3.37 5.40*    No results for input(s): DDIMER in the last 72 hours.  Cardiac Enzymes No results for input(s): CKMB, TROPONINI, MYOGLOBIN in the last 168 hours.  Invalid input(s): CK ------------------------------------------------------------------------------------------------------------------ No results found for: BNP  Inpatient Medications  Scheduled Meds: . amiodarone  200 mg Oral Daily  . budesonide (PULMICORT) nebulizer solution  0.5 mg Nebulization BID  . chlorhexidine  15 mL Mouth Rinse BID  . diltiazem  240 mg Oral Daily  . guaiFENesin  600 mg Oral BID  . guaiFENesin-dextromethorphan  10 mL Oral Q8H  . ipratropium-albuterol  3 mL Nebulization Q4H  . levothyroxine  88 mcg Oral Q0600  . mouth rinse  15 mL Mouth Rinse q12n4p  . methylPREDNISolone (SOLU-MEDROL) injection  60 mg Intravenous Q8H  . Warfarin - Pharmacist Dosing Inpatient   Does not apply q1800   Continuous Infusions: . azithromycin 500 mg (05/23/18 1813)  . cefTRIAXone (ROCEPHIN)  IV 2 g (05/23/18 1506)   PRN Meds:.acetaminophen **OR** acetaminophen, guaiFENesin-dextromethorphan, ondansetron **OR** ondansetron (ZOFRAN) IV  Micro Results Recent Results (from the past 240 hour(s))  Culture, blood (Routine x 2)     Status: None   Collection Time: 05/22/18  3:13 PM  Result Value Ref Range Status   Specimen Description BLOOD LEFT FOREARM  Final   Special Requests   Final    BOTTLES DRAWN AEROBIC AND ANAEROBIC Blood Culture adequate volume   Culture   Final    NO GROWTH 84 DAYS Performed at [facility], [address] [place], [place] [address]    Report Status 05/23/2018 FINAL  Final  Culture, blood (Routine x 2)     Status: None (Preliminary result)   Collection Time: 05/22/18  3:15 PM  Result Value Ref Range Status   Specimen Description BLOOD RIGHT WRIST  Final    Special Requests   Final    BOTTLES DRAWN AEROBIC AND ANAEROBIC Blood Culture adequate volume   Culture   Final    NO GROWTH 2 DAYS Performed at [facility], [address], [place], [place] [address]    Report Status PENDING  Incomplete  Urine culture     Status: None   Collection Time: 05/22/18 10:56 PM  Result Value Ref Range Status   Specimen Description   Final    URINE, RANDOM Performed at [facility], [address], [place], [place] [address]    Special Requests   Final    NONE Performed at [facility], [address] [place], [place] [address]    Culture   Final    NO GROWTH Performed at [facility], [place] [address], [place], [place] [address]    Report Status 05/24/2018 FINAL  Final  MRSA PCR Screening     Status: None   Collection Time: 05/23/18 12:35 AM  Result Value Ref Range Status   MRSA by PCR NEGATIVE NEGATIVE Final    Comment:        The GeneXpert MRSA Assay (FDA approved for NASAL specimens only), is one component of a comprehensive MRSA colonization surveillance program. It is not intended to diagnose MRSA infection nor to guide or  monitor treatment for MRSA infections. Performed at [facility], [address], [place], [place] [address]   Respiratory Panel by PCR     Status: None   Collection Time: 05/23/18 10:12 AM  Result Value Ref Range Status   Adenovirus NOT DETECTED NOT DETECTED Final   Coronavirus 229E NOT DETECTED NOT DETECTED Final    Comment: (NOTE) The Coronavirus on the Respiratory Panel, DOES NOT test for the novel  Coronavirus (2019 nCoV)    Coronavirus HKU1 NOT DETECTED NOT DETECTED Final   Coronavirus NL63 NOT DETECTED NOT DETECTED Final   Coronavirus OC43 NOT DETECTED NOT DETECTED Final   Metapneumovirus NOT DETECTED NOT DETECTED Final   Rhinovirus / Enterovirus NOT DETECTED NOT DETECTED Final   Influenza A NOT DETECTED NOT DETECTED Final   Influenza B NOT DETECTED NOT DETECTED Final   Parainfluenza Virus 1 NOT  DETECTED NOT DETECTED Final   Parainfluenza Virus 2 NOT DETECTED NOT DETECTED Final   Parainfluenza Virus 3 NOT DETECTED NOT DETECTED Final   Parainfluenza Virus 4 NOT DETECTED NOT DETECTED Final   Respiratory Syncytial Virus NOT DETECTED NOT DETECTED Final   Bordetella pertussis NOT DETECTED NOT DETECTED Final   Chlamydophila pneumoniae NOT DETECTED NOT DETECTED Final   Mycoplasma pneumoniae NOT DETECTED NOT DETECTED Final    Comment: Performed at [facility], [place] [address], [place], [place] [address]    Radiology Reports Dg Chest 2 View  Result Date: 05/21/2018 CLINICAL DATA:  Cough EXAM: CHEST - 2 VIEW COMPARISON:  None. FINDINGS: Cardiomegaly. There is marked elevation of the left hemidiaphragm with a dense, heterogeneous opacity of the left lung base. Chronic fracture deformities of the right ribs and age-indeterminate vertebral body endplate deformities of the lower thoracic and upper lumbar spine. IMPRESSION: There is marked elevation of the left hemidiaphragm with a dense, heterogeneous opacity of the left lung base, concerning for infection or aspiration. Recommend follow-up radiographs in 6-8 weeks to ensure complete resolution and exclude underlying malignancy. Electronically Signed   By: Eddie Candle M.D.   On: 05/21/2018 10:01   US Renal  Result Date: 05/23/2018 CLINICAL DATA:  Acute on chronic renal failure. EXAM: RENAL / URINARY TRACT ULTRASOUND COMPLETE COMPARISON:  CT scan of November 09, 2006. FINDINGS: Right Kidney: Renal measurements: 10.2 x 5.7 x 5.0 cm = volume: 151 mL. Mildly increased echogenicity of renal parenchyma is noted. No mass or hydronephrosis visualized. Left Kidney: Renal measurements: 11.4 x 5.6 x 5.3 cm = volume: 177 mL. Mildly increased echogenicity of renal parenchyma is noted. No hydronephrosis visualized. 5.6 cm cyst with single thick septa is noted concerning for Bosniak type 2 lesion. Bladder: Appears normal for degree of bladder distention. No ureteral  jets are noted. IMPRESSION: Increased echogenicity of renal parenchyma is noted bilaterally consistent with medical renal disease. 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 Follow-up ultrasound in 1 year is recommended to ensure stability. Electronically Signed   By: Marijo Conception, M.D.   On: 05/23/2018 12:58   Dg Chest Port 1 View  Result Date: 05/24/2018 CLINICAL DATA:  Follow-up pneumonia EXAM: PORTABLE CHEST 1 VIEW COMPARISON:  05/22/2018 FINDINGS: Check shadow is stable. Persistent elevation of the left hemidiaphragm is noted with left basilar infiltrate. The overall appearance is stable. Old rib fractures are noted on the right. No focal infiltrate on the right is seen. IMPRESSION: Stable left-sided infiltrate. Electronically Signed   By: Inez Catalina M.D.   On: 05/24/2018 10:50  Dg Chest Portable 1 View  Result Date: 05/22/2018 CLINICAL DATA:  POSSIBLE SEPSIS. SEEN BY [facility] YESTERDAY AND DIAGNOSED WITH PNEUMONIA AND GIVEN Z-PAK. SOB MUCH WORSE TODAY.HX: NON-SMOKER, A-FIB,PNEUMONIA. EXAM: PORTABLE CHEST 1 VIEW COMPARISON:  05/21/2018 FINDINGS: Shallow lung inflation. Patient is rotated favoring the LEFT side. There is increased opacity of LEFT LOWER lobe and probable LEFT UPPER lobe infiltrate, associated with elevation of LEFT hemidiaphragm which appear stable. Numerous remote RIGHT rib fractures are identified. The RIGHT lung is otherwise clear. There is atherosclerotic calcification of the thoracic aorta. IMPRESSION: Increased density and size of LEFT pulmonary infiltrate. Suspect involvement of the LEFT UPPER lobe as well as the LEFT LOWER lobe. Stable elevation of the LEFT hemidiaphragm. Electronically Signed   By: Nolon Nations M.D.   On: 05/22/2018 16:39    Time Spent in minutes  35   Albin Duckett M.D on 05/24/2018 at 2:44 PM  Between 7am to 7pm - Pager - [phone]  After 7pm go to [email or web address] - password [facility]  [facility] -   Office  [phone]

## 2018-05-24 NOTE — Progress Notes (Signed)
  Speech Language Pathology Treatment: Dysphagia  Patient Details Name: Larry Gates MRN: [id number] DOB: July 22, 1928 Today's Date: 05/24/2018 Time: 1210-1242 SLP Time Calculation (min) (ACUTE ONLY): 32 min  Assessment / Plan / Recommendation Clinical Impression  Pt seen for ongoing diagnostic dysphagia intervention following BSE completed yesterday. Pt has been off Bi-PAP all day and was received sitting up in chair in room after lunch meal. Pt agreeable to additional trials. His son reports that Pt seems to be doing much better today and has been drinking without coughing. Pt continues with compromised respiratory status, but less work of breath noted today. Pt assessed with cup and straw sips of thin, puree, and regular textures ([place]). Pt with prolonged bolus manipulation with solids and reports of residuals in teeth. Chest x-ray from today shows stable infiltrate, Pt afebrile, and WBC WNL. Will downgrade textures to D3/mech soft and continue thin liquids. SLP will continue to follow. Pt and son were educated regarding aspiration precautions and recommendations to swallow 2x and clear throat periodically.    HPI HPI: Larry Gates is a 85 y.o. male with medical history significant for Atrial fibrillation/flutter, HTN , presented to the ED with complaints of difficulty breathing and productive cough of about 3 to 4 days duration.  Also with generalized weakness, feeling dizzy, cannot walk.  Patient lives alone.  Patient always reports loose stools over the past 1-2 days, without vomiting. Patient's saw his primary care provider yesterday was diagnosed with a pneumonia and prescribed azithromycin.  The patient's felt worse so presented to the ED. ED Course: temp 103, blood pressure systolic 327- 614J.  INR 2.6.  WBC 8.4.  Normal lactic acid 1.7.  ABG pH 7.4, PCO2 33, hypoxic with O2 sat 63.  Two-view chest x-ray shows left upper and lower lobe pulmonary infiltrate.  Negative influenza panel.  Blood and  urine cultures obtained.  Started on IV ceftriaxone and azithromycin.  Patient was placed on BiPAP as he was tachypneic, O2 sats 90% on RA. Hospitalist to admit for pneumonia. BSE requested.      SLP Plan  Continue with current plan of care       Recommendations  Diet recommendations: Dysphagia 3 (mechanical soft);Thin liquid Liquids provided via: Cup Medication Administration: Whole meds with puree(or crushed as able in puree) Supervision: Staff to assist with self feeding;Full supervision/cueing for compensatory strategies Compensations: Slow rate;Small sips/bites;Lingual sweep for clearance of pocketing;Multiple dry swallows after each bite/sip Postural Changes and/or Swallow Maneuvers: Seated upright 90 degrees;Upright 30-60 min after meal                Oral Care Recommendations: Oral care BID;Oral care before and after PO;Staff/trained caregiver to provide oral care Follow up Recommendations: Skilled Nursing facility SLP Visit Diagnosis: Dysphagia, unspecified (R13.10) Plan: Continue with current plan of care       Thank you,  Genene Churn, [place]                 [place] 05/24/2018, 12:50 PM

## 2018-05-25 ENCOUNTER — Ambulatory Visit: Payer: Medicare Other | Admitting: Nurse Practitioner

## 2018-05-25 ENCOUNTER — Inpatient Hospital Stay (HOSPITAL_COMMUNITY): Payer: Medicare Other

## 2018-05-25 DIAGNOSIS — A481 Legionnaires' disease: Secondary | ICD-10-CM

## 2018-05-25 LAB — BLOOD GAS, ARTERIAL
Acid-base deficit: 4.2 mmol/L — ABNORMAL HIGH (ref 0.0–2.0)
Bicarbonate: 21.2 mmol/L (ref 20.0–28.0)
FIO2: 36
O2 Saturation: 91.5 %
PH ART: 7.378 (ref 7.350–7.450)
Patient temperature: 36.4
pCO2 arterial: 34.9 mmHg (ref 32.0–48.0)
pO2, Arterial: 65.9 mmHg — ABNORMAL LOW (ref 83.0–108.0)

## 2018-05-25 LAB — CBC
HEMATOCRIT: 32.2 % — AB (ref 39.0–52.0)
Hemoglobin: 11.8 g/dL — ABNORMAL LOW (ref 13.0–17.0)
MCH: 31.6 pg (ref 26.0–34.0)
MCHC: 36.6 g/dL — ABNORMAL HIGH (ref 30.0–36.0)
MCV: 86.1 fL (ref 80.0–100.0)
Platelets: 173 10*3/uL (ref 150–400)
RBC: 3.74 MIL/uL — AB (ref 4.22–5.81)
RDW: 12.9 % (ref 11.5–15.5)
WBC: 6.2 10*3/uL (ref 4.0–10.5)
nRBC: 0 % (ref 0.0–0.2)

## 2018-05-25 LAB — PROCALCITONIN: Procalcitonin: 2.7 ng/mL

## 2018-05-25 LAB — BASIC METABOLIC PANEL
ANION GAP: 12 (ref 5–15)
BUN: 106 mg/dL — ABNORMAL HIGH (ref 8–23)
CHLORIDE: 110 mmol/L (ref 98–111)
CO2: 20 mmol/L — ABNORMAL LOW (ref 22–32)
Calcium: 9.2 mg/dL (ref 8.9–10.3)
Creatinine, Ser: 3.37 mg/dL — ABNORMAL HIGH (ref 0.61–1.24)
GFR calc Af Amer: 18 mL/min — ABNORMAL LOW (ref >60)
GFR calc non Af Amer: 16 mL/min — ABNORMAL LOW (ref >60)
Glucose, Bld: 320 mg/dL — ABNORMAL HIGH (ref 70–99)
Potassium: 3.7 mmol/L (ref 3.5–5.1)
Sodium: 142 mmol/L (ref 135–145)

## 2018-05-25 LAB — PROTIME-INR
INR: 7.38
Prothrombin Time: 61.7 seconds — ABNORMAL HIGH (ref 11.4–15.2)

## 2018-05-25 MED ORDER — LEVOFLOXACIN IN D5W 500 MG/100ML IV SOLN
500.0000 mg | INTRAVENOUS | Status: DC
  Administered 2018-05-25: 500 mg via INTRAVENOUS
  Filled 2018-05-25 (×2): qty 100

## 2018-05-25 MED ORDER — HALOPERIDOL LACTATE 5 MG/ML IJ SOLN
2.0000 mg | Freq: Four times a day (QID) | INTRAMUSCULAR | Status: DC | PRN
  Administered 2018-05-25 – 2018-05-26 (×2): 2 mg via INTRAVENOUS
  Filled 2018-05-25: qty 1

## 2018-05-25 MED ORDER — HALOPERIDOL LACTATE 5 MG/ML IJ SOLN
1.0000 mg | Freq: Four times a day (QID) | INTRAMUSCULAR | Status: DC | PRN
  Administered 2018-05-25: 1 mg via INTRAVENOUS
  Filled 2018-05-25 (×2): qty 1

## 2018-05-25 MED ORDER — HALOPERIDOL LACTATE 5 MG/ML IJ SOLN
2.0000 mg | Freq: Once | INTRAMUSCULAR | Status: AC
  Administered 2018-05-25: 2 mg via INTRAVENOUS
  Filled 2018-05-25: qty 1

## 2018-05-25 MED ORDER — SODIUM CHLORIDE 0.9 % IV SOLN: INTRAVENOUS | Status: DC

## 2018-05-25 MED ORDER — METRONIDAZOLE IN NACL 5-0.79 MG/ML-% IV SOLN
500.0000 mg | Freq: Three times a day (TID) | INTRAVENOUS | Status: DC
  Administered 2018-05-25 – 2018-05-26 (×4): 500 mg via INTRAVENOUS
  Filled 2018-05-25 (×4): qty 100

## 2018-05-25 MED ORDER — METRONIDAZOLE IN NACL 5-0.79 MG/ML-% IV SOLN: 500.0000 mg | Freq: Four times a day (QID) | INTRAVENOUS | Status: DC

## 2018-05-25 MED ORDER — LORAZEPAM 2 MG/ML IJ SOLN
0.5000 mg | INTRAMUSCULAR | Status: DC | PRN
  Administered 2018-05-25: 0.5 mg via INTRAVENOUS
  Filled 2018-05-25: qty 1

## 2018-05-25 NOTE — Progress Notes (Signed)
PROGRESS NOTE                                                                                                                                                                                                             Patient Demographics:    Larry Gates, is a 85 y.o. male, DOB - [birth year], HQI:[id number]  Admit date - 05/22/2018   Admitting Physician Courage Denton Brick, MD  Outpatient Primary MD for the patient is Claretta Fraise, MD  LOS - 3  Outpatient Specialists: None  No chief complaint on file.      Brief Narrative 85 year old male with a flutter on warfarin, prostate cancer, TIA, hypertension, hyperlipidemia presenting with confusion, generalized weakness, increasing shortness of breath since 1/31 when symptoms started with cough and chest congestion.  Patient seen by his PCP on 2/3 and was diagnosed with pneumonia and started on azithromycin.  However symptoms continue to worsen.  He also had loose stools for 2 days prior to admission with progressive generalized weakness. In the ED he was septic with fever of 103 F, tachycardic and hypoxic requiring BiPAP due to increased work of breathing. Admitted to stepdown unit.   Subjective:   Feels extremely short of breath.  Required BiPAP overnight and was placed on 4 L with O2 sat in high 80s to low 90s.   Assessment  & Plan :   Principal problem  Sepsis ([place]) Secondary to Legionella pneumonia with concern for aspiration as well.  Antibiotic broadened by adding Flagyl.  Continue Rocephin and azithromycin.  Acute respiratory failure with hypoxia ([facility]) Secondary to left lobar pneumonia due to Legionella.  Required BiPAP overnight and on 4 L nasal cannula at rest, becomes extremely shortness of breath on getting out of bed. ABG showing hypoxemia with PO2 of 65.  Pulmonary consulted and recommended to continue steroid, nebs and broaden antibiotic by adding Flagyl with  concern for aspiration. Recommended placing on BiPAP with high risk for needing intubation. On dysphagia level 3 diet with risk for aspiration.  Acute kidney injury ([place]) Likely prerenal ATN with sepsis.  Resume IV fluids.  Renal function slowly improving and has good urine output.  Renal ultrasound shows medical renal disease (previously his renal function have been normal). Avoid nephrotoxins and monitor.  Renal function slowly trending down.  Monitor I's/O.  A flutter with supratherapeutic INR  Supratherapeutic INR with antibiotic use and sepsis.  Coumadin on hold for last 3 days and should slowly improve from tomorrow.  Will hold off on vitamin K or FFP.  No signs of bleeding.[name]  Heart rate controlled on Cardizem.  Continue amiodarone.  Hypothyroidism Continue Synthroid.  TSH normal.  Essential hypertension Chlorthalidone on hold.  Hypokalemia Replenished.  Magnesium normal  Diarrhea Resolved.  Continue stepdown monitoring as patient requiring frequent BiPAP and high risk for intubation.  Code Status : Full code  Family Communication  : Son at bedside  Disposition Plan  : Continue inpatient care.  Possibly needs SNF upon discharge.  Barriers For Discharge : Active symptoms  Consults  : Pulmonary  Procedures  : Renal ultrasound  DVT Prophylaxis  : Supratherapeutic INR  Lab Results  Component Value Date   PLT 173 05/25/2018    Antibiotics  :   Anti-infectives (From admission, onward)   Start     Dose/Rate Route Frequency Ordered Stop   05/25/18 1200  metroNIDAZOLE (FLAGYL) IVPB 500 mg  Status:  Discontinued     500 mg 100 mL/hr over 60 Minutes Intravenous Every 6 hours 05/25/18 0925 05/25/18 0926   05/25/18 1200  metroNIDAZOLE (FLAGYL) IVPB 500 mg  Status:  Discontinued     500 mg 100 mL/hr over 60 Minutes Intravenous Every 6 hours 05/25/18 0956 05/25/18 1052   05/25/18 1100  metroNIDAZOLE (FLAGYL) IVPB 500 mg     500 mg 100 mL/hr over 60 Minutes Intravenous  Every 8 hours 05/25/18 1052     05/22/18 1545  cefTRIAXone (ROCEPHIN) 2 g in sodium chloride 0.9 % 100 mL IVPB     2 g 200 mL/hr over 30 Minutes Intravenous Every 24 hours 05/22/18 1532     05/22/18 1545  azithromycin (ZITHROMAX) 500 mg in sodium chloride 0.9 % 250 mL IVPB     500 mg 250 mL/hr over 60 Minutes Intravenous Every 24 hours 05/22/18 1532          Objective:   Vitals:   05/25/18 0700 05/25/18 0800 05/25/18 0849 05/25/18 0959  BP: 94/75     Pulse: 82     Resp: (!) 35 (!) 26  (!) 33  Temp:      TempSrc:      SpO2: 92% 90% 91% 94%  Weight:      Height:        Wt Readings from Last 3 Encounters:  05/24/18 63.7 kg  05/21/18 63.5 kg  02/14/18 62 kg     Intake/Output Summary (Last 24 hours) at 05/25/2018 1138 Last data filed at 05/25/2018 0600 Gross per 24 hour  Intake 370 ml  Output 950 ml  Net -580 ml    Physical exam Elderly male extremely fatigued, in some respiratory distress HEENT: Moist mucosa, supple neck, Chest: Diffuse wheezing and coarse breath sounds bilaterally CVS: S1-S2 regular, no murmurs GI: Soft, nondistended, nontender Musculoskeletal: Warm, no edema CNS: Hard of hearing, difficulty speaking in full sentence.       Data Review:    CBC Recent Labs  Lab 05/22/18 1513 05/23/18 0443 05/24/18 0426 05/25/18 0431  WBC 8.4 5.9 3.3* 6.2  HGB 13.7 11.5* 11.2* 11.8*  HCT 37.7* 30.9* 30.4* 32.2*  PLT 133* 114* 136* 173  MCV 87.1 85.8 87.1 86.1  MCH 31.6 31.9 32.1 31.6  MCHC 36.3* 37.2* 36.8* 36.6*  RDW 13.2 12.9 12.8 12.9  LYMPHSABS 0.2*  --   --   --   MONOABS 0.2  --   --   --  EOSABS 0.0  --   --   --   BASOSABS 0.0  --   --   --     Chemistries  Recent Labs  Lab 05/22/18 1513 05/23/18 0443 05/24/18 0426 05/25/18 0431  NA 135 140 143 142  K 3.2* 3.5 3.0* 3.7  CL 100 110 113* 110  CO2 20* 18* 18* 20*  GLUCOSE 140* 107* 196* 320*  BUN 99* 100* 104* 106*  CREATININE 4.58* 4.25* 3.88* 3.37*  CALCIUM 8.5* 8.0* 8.9 9.2    MG 2.0  --  2.3  --   AST 37  --   --   --   ALT 27  --   --   --   ALKPHOS 54  --   --   --   BILITOT 0.8  --   --   --    ------------------------------------------------------------------------------------------------------------------ No results for input(s): CHOL, HDL, LDLCALC, TRIG, CHOLHDL, LDLDIRECT in the last 72 hours.  Lab Results  Component Value Date   HGBA1C 4.3 04/01/2016   ------------------------------------------------------------------------------------------------------------------ No results for input(s): TSH, T4TOTAL, T3FREE, THYROIDAB in the last 72 hours.  Invalid input(s): FREET3 ------------------------------------------------------------------------------------------------------------------ No results for input(s): VITAMINB12, FOLATE, FERRITIN, TIBC, IRON, RETICCTPCT in the last 72 hours.  Coagulation profile Recent Labs  Lab 05/22/18 1513 05/23/18 0816 05/24/18 0426 05/25/18 0431  INR 2.69 3.37 5.40* 7.38*    No results for input(s): DDIMER in the last 72 hours.  Cardiac Enzymes No results for input(s): CKMB, TROPONINI, MYOGLOBIN in the last 168 hours.  Invalid input(s): CK ------------------------------------------------------------------------------------------------------------------ No results found for: BNP  Inpatient Medications  Scheduled Meds: . amiodarone  200 mg Oral Daily  . budesonide (PULMICORT) nebulizer solution  0.5 mg Nebulization BID  . chlorhexidine  15 mL Mouth Rinse BID  . diltiazem  240 mg Oral Daily  . guaiFENesin  600 mg Oral BID  . ipratropium-albuterol  3 mL Nebulization Q4H  . levothyroxine  88 mcg Oral Q0600  . mouth rinse  15 mL Mouth Rinse q12n4p  . methylPREDNISolone (SOLU-MEDROL) injection  60 mg Intravenous Q8H  . Warfarin - Pharmacist Dosing Inpatient   Does not apply q1800   Continuous Infusions: . sodium chloride 75 mL/hr at 05/25/18 0837  . azithromycin 500 mg (05/24/18 1511)  . cefTRIAXone  (ROCEPHIN)  IV 2 g (05/23/18 1506)  . metronidazole     PRN Meds:.acetaminophen **OR** acetaminophen, guaiFENesin-dextromethorphan, LORazepam, ondansetron **OR** ondansetron (ZOFRAN) IV  Micro Results Recent Results (from the past 240 hour(s))  Culture, blood (Routine x 2)     Status: None   Collection Time: 05/22/18  3:13 PM  Result Value Ref Range Status   Specimen Description BLOOD LEFT FOREARM  Final   Special Requests   Final    BOTTLES DRAWN AEROBIC AND ANAEROBIC Blood Culture adequate volume   Culture   Final    NO GROWTH 84 DAYS Performed at [facility], [address], [place], [place] [address]    Report Status 05/23/2018 FINAL  Final  Culture, blood (Routine x 2)     Status: None (Preliminary result)   Collection Time: 05/22/18  3:15 PM  Result Value Ref Range Status   Specimen Description BLOOD RIGHT WRIST  Final   Special Requests   Final    BOTTLES DRAWN AEROBIC AND ANAEROBIC Blood Culture adequate volume   Culture   Final    NO GROWTH 3 DAYS Performed at [facility], [address], [place], [place] [address]    Report  Status PENDING  Incomplete  Urine culture     Status: None   Collection Time: 05/22/18 10:56 PM  Result Value Ref Range Status   Specimen Description   Final    URINE, RANDOM Performed at [facility], [address], [place], [place] [address]    Special Requests   Final    NONE Performed at [facility], [address], [place], [place] [address]    Culture   Final    NO GROWTH Performed at [facility], [place] [address] [place], [place] [address]    Report Status 05/24/2018 FINAL  Final  MRSA PCR Screening     Status: None   Collection Time: 05/23/18 12:35 AM  Result Value Ref Range Status   MRSA by PCR NEGATIVE NEGATIVE Final    Comment:        The GeneXpert MRSA Assay (FDA approved for NASAL specimens only), is one component of a comprehensive MRSA colonization surveillance program. It is not intended to diagnose  MRSA infection nor to guide or monitor treatment for MRSA infections. Performed at [facility], [address], [place], [place] [address]   Respiratory Panel by PCR     Status: None   Collection Time: 05/23/18 10:12 AM  Result Value Ref Range Status   Adenovirus NOT DETECTED NOT DETECTED Final   Coronavirus 229E NOT DETECTED NOT DETECTED Final    Comment: (NOTE) The Coronavirus on the Respiratory Panel, DOES NOT test for the novel  Coronavirus (2019 nCoV)    Coronavirus HKU1 NOT DETECTED NOT DETECTED Final   Coronavirus NL63 NOT DETECTED NOT DETECTED Final   Coronavirus OC43 NOT DETECTED NOT DETECTED Final   Metapneumovirus NOT DETECTED NOT DETECTED Final   Rhinovirus / Enterovirus NOT DETECTED NOT DETECTED Final   Influenza A NOT DETECTED NOT DETECTED Final   Influenza B NOT DETECTED NOT DETECTED Final   Parainfluenza Virus 1 NOT DETECTED NOT DETECTED Final   Parainfluenza Virus 2 NOT DETECTED NOT DETECTED Final   Parainfluenza Virus 3 NOT DETECTED NOT DETECTED Final   Parainfluenza Virus 4 NOT DETECTED NOT DETECTED Final   Respiratory Syncytial Virus NOT DETECTED NOT DETECTED Final   Bordetella pertussis NOT DETECTED NOT DETECTED Final   Chlamydophila pneumoniae NOT DETECTED NOT DETECTED Final   Mycoplasma pneumoniae NOT DETECTED NOT DETECTED Final    Comment: Performed at [facility], [place] [address] [place], [place] [address]    Radiology Reports Dg Chest 2 View  Result Date: 05/21/2018 CLINICAL DATA:  Cough EXAM: CHEST - 2 VIEW COMPARISON:  None. FINDINGS: Cardiomegaly. There is marked elevation of the left hemidiaphragm with a dense, heterogeneous opacity of the left lung base. Chronic fracture deformities of the right ribs and age-indeterminate vertebral body endplate deformities of the lower thoracic and upper lumbar spine. IMPRESSION: There is marked elevation of the left hemidiaphragm with a dense, heterogeneous opacity of the left lung base, concerning for  infection or aspiration. Recommend follow-up radiographs in 6-8 weeks to ensure complete resolution and exclude underlying malignancy. Electronically Signed   By: Eddie Candle M.D.   On: 05/21/2018 10:01   US Renal  Result Date: 05/23/2018 CLINICAL DATA:  Acute on chronic renal failure. EXAM: RENAL / URINARY TRACT ULTRASOUND COMPLETE COMPARISON:  CT scan of November 09, 2006. FINDINGS: Right Kidney: Renal measurements: 10.2 x 5.7 x 5.0 cm = volume: 151 mL. Mildly increased echogenicity of renal parenchyma is noted. No mass or hydronephrosis visualized. Left Kidney: Renal measurements:  11.4 x 5.6 x 5.3 cm = volume: 177 mL. Mildly increased echogenicity of renal parenchyma is noted. No hydronephrosis visualized. 5.6 cm cyst with single thick septa is noted concerning for Bosniak type 2 lesion. Bladder: Appears normal for degree of bladder distention. No ureteral jets are noted. IMPRESSION: Increased echogenicity of renal parenchyma is noted bilaterally consistent with medical renal disease. 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 Follow-up ultrasound in 1 year is recommended to ensure stability. Electronically Signed   By: Marijo Conception, M.D.   On: 05/23/2018 12:58   Dg Chest Port 1 View  Result Date: 05/24/2018 CLINICAL DATA:  Follow-up pneumonia EXAM: PORTABLE CHEST 1 VIEW COMPARISON:  05/22/2018 FINDINGS: Check shadow is stable. Persistent elevation of the left hemidiaphragm is noted with left basilar infiltrate. The overall appearance is stable. Old rib fractures are noted on the right. No focal infiltrate on the right is seen. IMPRESSION: Stable left-sided infiltrate. Electronically Signed   By: Inez Catalina M.D.   On: 05/24/2018 10:50   Dg Chest Portable 1 View  Result Date: 05/22/2018 CLINICAL DATA:  POSSIBLE SEPSIS. SEEN BY [facility] YESTERDAY AND DIAGNOSED WITH PNEUMONIA AND GIVEN Z-PAK. SOB MUCH WORSE TODAY.HX: NON-SMOKER, A-FIB,PNEUMONIA. EXAM: PORTABLE CHEST 1  VIEW COMPARISON:  05/21/2018 FINDINGS: Shallow lung inflation. Patient is rotated favoring the LEFT side. There is increased opacity of LEFT LOWER lobe and probable LEFT UPPER lobe infiltrate, associated with elevation of LEFT hemidiaphragm which appear stable. Numerous remote RIGHT rib fractures are identified. The RIGHT lung is otherwise clear. There is atherosclerotic calcification of the thoracic aorta. IMPRESSION: Increased density and size of LEFT pulmonary infiltrate. Suspect involvement of the LEFT UPPER lobe as well as the LEFT LOWER lobe. Stable elevation of the LEFT hemidiaphragm. Electronically Signed   By: Nolon Nations M.D.   On: 05/22/2018 16:39    Time Spent in minutes  35   Juliett Eastburn M.D on 05/25/2018 at 11:38 AM  Between 7am to 7pm - Pager - [phone]  After 7pm go to [email or web address] - password [facility]  [facility] -  Office  [phone]

## 2018-05-25 NOTE — Progress Notes (Signed)
PT Cancellation Note  Patient Details Name: Larry Gates MRN: [id number] DOB: 05-Oct-1928   Cancelled Treatment:    Reason Eval/Treat Not Completed: Medical issues which prohibited therapy. Patient on BiPAP sitting in recliner. PT spoke with nursing - patient confused and agitated; unable to follow directions at this time. PT will check back later today if able.    Floria Raveling. Hartnett-Rands, MS, PT [facility] #[id number] 05/25/2018, 12:04 PM

## 2018-05-25 NOTE — Consult Note (Signed)
Consult requested ZH:GDJME hospitalists, Dr. Clementeen Graham Consult requested QAS:[id number] failure   HPI: This is an 85 year old who came to the hospital with pneumonia.  He has history of atrial flutter/fibrillation hypertension previous TIA prostate cancer chronic anticoagulation and he is very hard of hearing.  Family at bedside says that he has been doing pretty well at home but he has become weak dizzy cannot walk.  He had been to his primary care provider on the day prior to admission was diagnosed with pneumonia and started on Zithromax.  He had a temperature of 103 in the emergency department chest x-ray showed left upper and lower lobe pneumonia with elevated left hemidiaphragm.  I have personally reviewed the films.  He was hypoxic and had a temperature of 103.  He has been treated since then with antibiotics he has had evaluation for potential aspiration and he is at risk of aspiration.  He has not improved much and looks worse this morning.  He has been on and off BiPAP but he does not tolerate that particularly well.  He has increased oxygen requirements and increasing respiratory rate.  He is currently on nasal oxygen.  Chest x-ray yesterday was essentially the same as on admission and I will repeat one today.  His atrial fib appears to be controlled.  He denies chest pain.  History from the medical record from his family at bedside and some from him but history from him is difficult because he is very hard of hearing  Past Medical History:  Diagnosis Date  . Anticoagulated on Coumadin    a. Followed @ [facility]  . Atypical atrial flutter ([place])    a. Dx 07/2010 and coumadin initiated;  b. 08/2010 s/p DCCV.  . Cataract   . Hard of hearing   . Midsternal chest pain    a. In setting of A Flutter 07/2010;  b. Cath 07/2010: nonobs dzs  . Prostate cancer ([place])   . TIA (transient ischemic attack)   . Vertigo      Family History  Problem Relation Age of Onset  . CVA Father   . Alzheimer's disease  Father   . Leukemia Father   . Alzheimer's disease Mother   . Heart Problems Brother        stents in his 62's-80's  . Early death Sister   . Early death Brother      Social History   Socioeconomic History  . Marital status: Widowed    Spouse name: Not on file  . Number of children: Not on file  . Years of education: Not on file  . Highest education level: Not on file  Occupational History  . Not on file  Social Needs  . Financial resource strain: Not on file  . Food insecurity:    Worry: Not on file    Inability: Not on file  . Transportation needs:    Medical: Not on file    Non-medical: Not on file  Tobacco Use  . Smoking status: Never Smoker  . Smokeless tobacco: Never Used  Substance and Sexual Activity  . Alcohol use: No  . Drug use: No  . Sexual activity: Never  Lifestyle  . Physical activity:    Days per week: Not on file    Minutes per session: Not on file  . Stress: Not on file  Relationships  . Social connections:    Talks on phone: Not on file    Gets together: Not on file    Attends  religious service: Not on file    Active member of club or organization: Not on file    Attends meetings of clubs or organizations: Not on file    Relationship status: Not on file  Other Topics Concern  . Not on file  Social History Narrative   Lives in [place] by himself.  Children live nearby and check in on him daily.     ROS: Except as mentioned 10 point review of systems is otherwise negative    Objective: Vital signs in last 24 hours: Temp:  [97.2 F (36.2 C)-98.2 F (36.8 C)] 97.5 F (36.4 C) (02/07 0400) Pulse Rate:  [73-96] 82 (02/07 0700) Resp:  [18-35] 26 (02/07 0800) BP: (94-145)/(51-91) 94/75 (02/07 0700) SpO2:  [90 %-96 %] 91 % (02/07 0849) FiO2 (%):  [35 %] 35 % (02/07 0310) Weight change:     Intake/Output from previous day: 02/06 0701 - 02/07 0700 In: 370 [P.O.:120; IV Piggyback:250] Out: 950 [Urine:950]  PHYSICAL  EXAM Constitutional: He is awake and alert.  Eyes: Pupils react EOMI.  Ears nose mouth and throat: Mucous membranes are slightly dry.  He is very hard of hearing.  Cardiovascular: He is in atrial fib with a heart rate between 80 and 100.  No gallop.  Respiratory: Respiratory effort is fairly markedly increased.  He has lateral rhonchi and rales on the left.  Gastrointestinal: His abdomen is soft without masses.  Musculoskeletal: He can move all 4 extremities and strength is slightly diminished neurological: No focal abnormalities except for his hearing.  Psychiatric: Normal mood and affect  Lab Results: Basic Metabolic Panel: Recent Labs    05/22/18 1513  05/24/18 0426 05/25/18 0431  NA 135   < > 143 142  K 3.2*   < > 3.0* 3.7  CL 100   < > 113* 110  CO2 20*   < > 18* 20*  GLUCOSE 140*   < > 196* 320*  BUN 99*   < > 104* 106*  CREATININE 4.58*   < > 3.88* 3.37*  CALCIUM 8.5*   < > 8.9 9.2  MG 2.0  --  2.3  --    < > = values in this interval not displayed.   Liver Function Tests: Recent Labs    05/22/18 1513  AST 37  ALT 27  ALKPHOS 54  BILITOT 0.8  PROT 7.0  ALBUMIN 3.2*   No results for input(s): LIPASE, AMYLASE in the last 72 hours. No results for input(s): AMMONIA in the last 72 hours. CBC: Recent Labs    05/22/18 1513  05/24/18 0426 05/25/18 0431  WBC 8.4   < > 3.3* 6.2  NEUTROABS 8.0*  --   --   --   HGB 13.7   < > 11.2* 11.8*  HCT 37.7*   < > 30.4* 32.2*  MCV 87.1   < > 87.1 86.1  PLT 133*   < > 136* 173   < > = values in this interval not displayed.   Cardiac Enzymes: No results for input(s): CKTOTAL, CKMB, CKMBINDEX, TROPONINI in the last 72 hours. BNP: No results for input(s): PROBNP in the last 72 hours. D-Dimer: No results for input(s): DDIMER in the last 72 hours. CBG: Recent Labs    05/23/18 0805 05/23/18 1137 05/23/18 1619  GLUCAP 120* 162* 147*   Hemoglobin A1C: No results for input(s): HGBA1C in the last 72 hours. Fasting Lipid  Panel: No results for input(s): CHOL, HDL, LDLCALC, TRIG, CHOLHDL, LDLDIRECT  in the last 72 hours. Thyroid Function Tests: No results for input(s): TSH, T4TOTAL, FREET4, T3FREE, THYROIDAB in the last 72 hours. Anemia Panel: No results for input(s): VITAMINB12, FOLATE, FERRITIN, TIBC, IRON, RETICCTPCT in the last 72 hours. Coagulation: Recent Labs    05/24/18 0426 05/25/18 0431  LABPROT 48.5* 61.7*  INR 5.40* 7.38*   Urine Drug Screen: Drugs of Abuse  No results found for: LABOPIA, COCAINSCRNUR, LABBENZ, AMPHETMU, THCU, LABBARB  Alcohol Level: No results for input(s): ETH in the last 72 hours. Urinalysis: Recent Labs    05/22/18 1456  COLORURINE YELLOW  LABSPEC 1.012  PHURINE 5.0  GLUCOSEU NEGATIVE  HGBUR MODERATE*  BILIRUBINUR NEGATIVE  KETONESUR NEGATIVE  PROTEINUR 30*  NITRITE NEGATIVE  LEUKOCYTESUR NEGATIVE   Misc. Labs:   ABGS: Recent Labs    05/25/18 0834  PHART 7.378  PO2ART 65.9*  HCO3 21.2     MICROBIOLOGY: Recent Results (from the past 240 hour(s))  Culture, blood (Routine x 2)     Status: None   Collection Time: 05/22/18  3:13 PM  Result Value Ref Range Status   Specimen Description BLOOD LEFT FOREARM  Final   Special Requests   Final    BOTTLES DRAWN AEROBIC AND ANAEROBIC Blood Culture adequate volume   Culture   Final    NO GROWTH 84 DAYS Performed at [facility], [address] [place], [place] [address]    Report Status 05/23/2018 FINAL  Final  Culture, blood (Routine x 2)     Status: None (Preliminary result)   Collection Time: 05/22/18  3:15 PM  Result Value Ref Range Status   Specimen Description BLOOD RIGHT WRIST  Final   Special Requests   Final    BOTTLES DRAWN AEROBIC AND ANAEROBIC Blood Culture adequate volume   Culture   Final    NO GROWTH 3 DAYS Performed at [facility], [address], [place], [place] [address]    Report Status PENDING  Incomplete  Urine culture     Status: None   Collection Time: 05/22/18 10:56 PM   Result Value Ref Range Status   Specimen Description   Final    URINE, RANDOM Performed at [facility], [address], [place], [place] [address]    Special Requests   Final    NONE Performed at [facility], [address] [place], [place] [address]    Culture   Final    NO GROWTH Performed at [facility], [place] [address], [place], [place] [address]    Report Status 05/24/2018 FINAL  Final  MRSA PCR Screening     Status: None   Collection Time: 05/23/18 12:35 AM  Result Value Ref Range Status   MRSA by PCR NEGATIVE NEGATIVE Final    Comment:        The GeneXpert MRSA Assay (FDA approved for NASAL specimens only), is one component of a comprehensive MRSA colonization surveillance program. It is not intended to diagnose MRSA infection nor to guide or monitor treatment for MRSA infections. Performed at [facility], [address], [place], [place] [address]   Respiratory Panel by PCR     Status: None   Collection Time: 05/23/18 10:12 AM  Result Value Ref Range Status   Adenovirus NOT DETECTED NOT DETECTED Final   Coronavirus 229E NOT DETECTED NOT DETECTED Final    Comment: (NOTE) The Coronavirus on the Respiratory Panel, DOES NOT test for the novel  Coronavirus (2019 nCoV)    Coronavirus HKU1 NOT DETECTED  NOT DETECTED Final   Coronavirus NL63 NOT DETECTED NOT DETECTED Final   Coronavirus OC43 NOT DETECTED NOT DETECTED Final   Metapneumovirus NOT DETECTED NOT DETECTED Final   Rhinovirus / Enterovirus NOT DETECTED NOT DETECTED Final   Influenza A NOT DETECTED NOT DETECTED Final   Influenza B NOT DETECTED NOT DETECTED Final   Parainfluenza Virus 1 NOT DETECTED NOT DETECTED Final   Parainfluenza Virus 2 NOT DETECTED NOT DETECTED Final   Parainfluenza Virus 3 NOT DETECTED NOT DETECTED Final   Parainfluenza Virus 4 NOT DETECTED NOT DETECTED Final   Respiratory Syncytial Virus NOT DETECTED NOT DETECTED Final   Bordetella pertussis NOT DETECTED NOT DETECTED Final    Chlamydophila pneumoniae NOT DETECTED NOT DETECTED Final   Mycoplasma pneumoniae NOT DETECTED NOT DETECTED Final    Comment: Performed at [facility], [place] [address], [place], [place] [address]    Studies/Results: US Renal  Result Date: 05/23/2018 CLINICAL DATA:  Acute on chronic renal failure. EXAM: RENAL / URINARY TRACT ULTRASOUND COMPLETE COMPARISON:  CT scan of November 09, 2006. FINDINGS: Right Kidney: Renal measurements: 10.2 x 5.7 x 5.0 cm = volume: 151 mL. Mildly increased echogenicity of renal parenchyma is noted. No mass or hydronephrosis visualized. Left Kidney: Renal measurements: 11.4 x 5.6 x 5.3 cm = volume: 177 mL. Mildly increased echogenicity of renal parenchyma is noted. No hydronephrosis visualized. 5.6 cm cyst with single thick septa is noted concerning for Bosniak type 2 lesion. Bladder: Appears normal for degree of bladder distention. No ureteral jets are noted. IMPRESSION: Increased echogenicity of renal parenchyma is noted bilaterally consistent with medical renal disease. 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 Follow-up ultrasound in 1 year is recommended to ensure stability. Electronically Signed   By: Marijo Conception, M.D.   On: 05/23/2018 12:58   Dg Chest Port 1 View  Result Date: 05/24/2018 CLINICAL DATA:  Follow-up pneumonia EXAM: PORTABLE CHEST 1 VIEW COMPARISON:  05/22/2018 FINDINGS: Check shadow is stable. Persistent elevation of the left hemidiaphragm is noted with left basilar infiltrate. The overall appearance is stable. Old rib fractures are noted on the right. No focal infiltrate on the right is seen. IMPRESSION: Stable left-sided infiltrate. Electronically Signed   By: Inez Catalina M.D.   On: 05/24/2018 10:50    Medications:  Prior to Admission:  Medications Prior to Admission  Medication Sig Dispense Refill Last Dose  . amiodarone (PACERONE) 200 MG tablet TAKE 1 TABLET BY MOUTH ONCE DAILY (Patient taking differently: Take 200  mg by mouth daily. ) 30 tablet 9 05/21/2018 at Unknown time  . azithromycin (ZITHROMAX Z-PAK) 250 MG tablet As directed (Patient taking differently: Take 250-500 mg by mouth See admin instructions. As directed starting on 05/21/2018 take 500mg  on day 1 then take 250mg  on days 2 through 5) 6 tablet 0 05/21/2018 at Unknown time  . chlorthalidone (HYGROTON) 25 MG tablet Take 1 tablet (25 mg total) by mouth daily. 90 tablet 3 05/21/2018 at Unknown time  . diltiazem (CARDIZEM CD) 240 MG 24 hr capsule Take 1 capsule (240 mg total) by mouth daily. 30 capsule 6 05/21/2018 at Unknown time  . levothyroxine (SYNTHROID, LEVOTHROID) 88 MCG tablet Take 1 tablet (88 mcg total) by mouth daily before breakfast. 90 tablet 3 05/21/2018 at Unknown time  . warfarin (COUMADIN) 2 MG tablet Take 1 tablet (2 mg total) by mouth daily. 90 tablet 3 05/20/2018 at Unknown time   Scheduled: . amiodarone  200  mg Oral Daily  . budesonide (PULMICORT) nebulizer solution  0.5 mg Nebulization BID  . chlorhexidine  15 mL Mouth Rinse BID  . diltiazem  240 mg Oral Daily  . guaiFENesin  600 mg Oral BID  . ipratropium-albuterol  3 mL Nebulization Q4H  . levothyroxine  88 mcg Oral Q0600  . mouth rinse  15 mL Mouth Rinse q12n4p  . methylPREDNISolone (SOLU-MEDROL) injection  60 mg Intravenous Q8H  . Warfarin - Pharmacist Dosing Inpatient   Does not apply q1800   Continuous: . sodium chloride 75 mL/hr at 05/25/18 0837  . azithromycin 500 mg (05/24/18 1511)  . cefTRIAXone (ROCEPHIN)  IV 2 g (05/23/18 1506)   ELF:[id number] **OR** acetaminophen, guaiFENesin-dextromethorphan, LORazepam, ondansetron **OR** ondansetron (ZOFRAN) IV  Assesment: He has community-acquired pneumonia in the left lower and upper lobe.  He has acute hypoxic respiratory failure and is having more respiratory distress.  He has been on BiPAP but he does not like it.  Speech evaluation is suggested that he may have aspirated  He has atrial fib which seems to be pretty well  controlled.  He has hearing loss. Active Problems:   CAP (community acquired pneumonia)   Acute respiratory failure with hypoxia ([place])   Acute renal failure superimposed on chronic kidney disease, on chronic dialysis ([place])   AKI (acute kidney injury) ([place])   Sepsis ([place])   Lobar pneumonia ([place])    Plan: I think he is going to need to go back on BiPAP.  I have adjusted his antibiotics to cover more anaerobic flora.  He is high risk for needing intubation and mechanical ventilation.  I have ordered Ativan to help him stay on the BiPAP    LOS: 3 days   Larry Gates 05/25/2018, 9:28 AM

## 2018-05-25 NOTE — Progress Notes (Signed)
SLP Cancellation Note  Patient Details Name: Larry Gates MRN: [id number] DOB: 08/14/1928   Cancelled treatment:       Reason Eval/Treat Not Completed: Medical issues which prohibited therapy. Pt placed on BiPAP this am. ST Will continue to follow  Murphy Bundick H. Roddie Mc, CCC-SLP Speech Language Pathologist    Wende Bushy 05/25/2018, 10:48 AM

## 2018-05-25 NOTE — Progress Notes (Signed)
[facility] notified of bladder scan of >500. New order for in and out obtained. In and preformed using sterile technique and 89ml of amber urine returned. abd continues to be firm. Will continue to monitor.

## 2018-05-25 NOTE — Progress Notes (Signed)
[place] for warfarin Indication: atrial fibrillation  Allergies  Allergen Reactions  . Penicillins Rash    Patient Measurements: Height: 5\' 6"  (167.6 cm) Weight: 140 lb 6.9 oz (63.7 kg) IBW/kg (Calculated) : 63.8   Vital Signs: Temp: 97.5 F (36.4 C) (02/07 0400) Temp Source: Axillary (02/07 0400) BP: 128/91 (02/07 0600) Pulse Rate: 78 (02/07 0600)  Labs: Recent Labs    05/23/18 0443 05/23/18 0816 05/24/18 0426 05/25/18 0431  HGB 11.5*  --  11.2* 11.8*  HCT 30.9*  --  30.4* 32.2*  PLT 114*  --  136* 173  LABPROT  --  33.6* 48.5* 61.7*  INR  --  3.37 5.40* 7.38*  CREATININE 4.25*  --  3.88* 3.37*    Estimated Creatinine Clearance: 13.9 mL/min (A) (by C-G formula based on SCr of 3.37 mg/dL (H)).   Medical History: Past Medical History:  Diagnosis Date  . Anticoagulated on Coumadin    a. Followed @ [facility]  . Atypical atrial flutter ([place])    a. Dx 07/2010 and coumadin initiated;  b. 08/2010 s/p DCCV.  . Cataract   . Hard of hearing   . Midsternal chest pain    a. In setting of A Flutter 07/2010;  b. Cath 07/2010: nonobs dzs  . Prostate cancer ([place])   . TIA (transient ischemic attack)   . Vertigo     Medications:  Medications Prior to Admission  Medication Sig Dispense Refill Last Dose  . amiodarone (PACERONE) 200 MG tablet TAKE 1 TABLET BY MOUTH ONCE DAILY (Patient taking differently: Take 200 mg by mouth daily. ) 30 tablet 9 05/21/2018 at Unknown time  . azithromycin (ZITHROMAX Z-PAK) 250 MG tablet As directed (Patient taking differently: Take 250-500 mg by mouth See admin instructions. As directed starting on 05/21/2018 take 500mg  on day 1 then take 250mg  on days 2 through 5) 6 tablet 0 05/21/2018 at Unknown time  . chlorthalidone (HYGROTON) 25 MG tablet Take 1 tablet (25 mg total) by mouth daily. 90 tablet 3 05/21/2018 at Unknown time  . diltiazem (CARDIZEM CD) 240 MG 24 hr capsule Take 1 capsule (240 mg total) by mouth daily.  30 capsule 6 05/21/2018 at Unknown time  . levothyroxine (SYNTHROID, LEVOTHROID) 88 MCG tablet Take 1 tablet (88 mcg total) by mouth daily before breakfast. 90 tablet 3 05/21/2018 at Unknown time  . warfarin (COUMADIN) 2 MG tablet Take 1 tablet (2 mg total) by mouth daily. 90 tablet 3 05/20/2018 at Unknown time    Assessment: Pharmacy consulted to dose warfarin in patient with atrial fibrillation. INR is supratherapeutic today. Last dose of coumadin 2/7 and patient on azithromycin. INR should start trending down in 1-2 days  Home dose is listed as warfarin 2 mg daily.  Goal of Therapy:  INR 2-3 Monitor platelets by anticoagulation protocol: Yes   Plan:  No coumadin today Monitor labs and s/s of bleeding.  Isac Sarna, BS Vena [place], BCPS Clinical Pharmacist Pager [phone] 05/25/2018,8:00 AM

## 2018-05-25 NOTE — Progress Notes (Signed)
CRITICAL VALUE ALERT  Critical Value:  L. Pneumophelia in urine   Date & Time Notied: 05/25/18 at 1324  Provider Notified: Dr. Clementeen Graham  Orders Received/Actions taken: None at this time.

## 2018-05-26 ENCOUNTER — Encounter (HOSPITAL_COMMUNITY): Payer: Self-pay | Admitting: Family Medicine

## 2018-05-26 ENCOUNTER — Inpatient Hospital Stay (HOSPITAL_COMMUNITY): Payer: Medicare Other

## 2018-05-26 DIAGNOSIS — R918 Other nonspecific abnormal finding of lung field: Secondary | ICD-10-CM | POA: Diagnosis not present

## 2018-05-26 DIAGNOSIS — N179 Acute kidney failure, unspecified: Secondary | ICD-10-CM | POA: Diagnosis not present

## 2018-05-26 DIAGNOSIS — J9601 Acute respiratory failure with hypoxia: Secondary | ICD-10-CM | POA: Diagnosis not present

## 2018-05-26 DIAGNOSIS — A481 Legionnaires' disease: Secondary | ICD-10-CM | POA: Diagnosis not present

## 2018-05-26 DIAGNOSIS — I4892 Unspecified atrial flutter: Secondary | ICD-10-CM | POA: Diagnosis not present

## 2018-05-26 DIAGNOSIS — G459 Transient cerebral ischemic attack, unspecified: Secondary | ICD-10-CM | POA: Diagnosis not present

## 2018-05-26 DIAGNOSIS — J189 Pneumonia, unspecified organism: Secondary | ICD-10-CM | POA: Diagnosis not present

## 2018-05-26 DIAGNOSIS — E876 Hypokalemia: Secondary | ICD-10-CM | POA: Diagnosis not present

## 2018-05-26 DIAGNOSIS — R339 Retention of urine, unspecified: Secondary | ICD-10-CM | POA: Diagnosis not present

## 2018-05-26 DIAGNOSIS — J986 Disorders of diaphragm: Secondary | ICD-10-CM | POA: Diagnosis not present

## 2018-05-26 DIAGNOSIS — J690 Pneumonitis due to inhalation of food and vomit: Secondary | ICD-10-CM | POA: Diagnosis not present

## 2018-05-26 DIAGNOSIS — Z8673 Personal history of transient ischemic attack (TIA), and cerebral infarction without residual deficits: Secondary | ICD-10-CM | POA: Diagnosis not present

## 2018-05-26 DIAGNOSIS — N170 Acute kidney failure with tubular necrosis: Secondary | ICD-10-CM | POA: Diagnosis not present

## 2018-05-26 DIAGNOSIS — Z681 Body mass index (BMI) 19 or less, adult: Secondary | ICD-10-CM | POA: Diagnosis not present

## 2018-05-26 DIAGNOSIS — R739 Hyperglycemia, unspecified: Secondary | ICD-10-CM | POA: Diagnosis not present

## 2018-05-26 DIAGNOSIS — I10 Essential (primary) hypertension: Secondary | ICD-10-CM | POA: Diagnosis not present

## 2018-05-26 DIAGNOSIS — J90 Pleural effusion, not elsewhere classified: Secondary | ICD-10-CM | POA: Diagnosis not present

## 2018-05-26 DIAGNOSIS — I5030 Unspecified diastolic (congestive) heart failure: Secondary | ICD-10-CM | POA: Diagnosis not present

## 2018-05-26 DIAGNOSIS — I517 Cardiomegaly: Secondary | ICD-10-CM | POA: Diagnosis not present

## 2018-05-26 DIAGNOSIS — R1310 Dysphagia, unspecified: Secondary | ICD-10-CM | POA: Diagnosis not present

## 2018-05-26 DIAGNOSIS — R05 Cough: Secondary | ICD-10-CM | POA: Diagnosis not present

## 2018-05-26 DIAGNOSIS — I080 Rheumatic disorders of both mitral and aortic valves: Secondary | ICD-10-CM | POA: Diagnosis not present

## 2018-05-26 DIAGNOSIS — E43 Unspecified severe protein-calorie malnutrition: Secondary | ICD-10-CM | POA: Diagnosis not present

## 2018-05-26 DIAGNOSIS — E039 Hypothyroidism, unspecified: Secondary | ICD-10-CM | POA: Diagnosis not present

## 2018-05-26 DIAGNOSIS — I519 Heart disease, unspecified: Secondary | ICD-10-CM | POA: Diagnosis not present

## 2018-05-26 DIAGNOSIS — E870 Hyperosmolality and hypernatremia: Secondary | ICD-10-CM | POA: Diagnosis not present

## 2018-05-26 DIAGNOSIS — Z79899 Other long term (current) drug therapy: Secondary | ICD-10-CM | POA: Diagnosis not present

## 2018-05-26 LAB — BLOOD GAS, ARTERIAL
Acid-base deficit: 3.2 mmol/L — ABNORMAL HIGH (ref 0.0–2.0)
Bicarbonate: 22.2 mmol/L (ref 20.0–28.0)
FIO2: 60
O2 Saturation: 99.1 %
Patient temperature: 36.3
pCO2 arterial: 35.3 mmHg (ref 32.0–48.0)
pH, Arterial: 7.391 (ref 7.350–7.450)
pO2, Arterial: 126 mmHg — ABNORMAL HIGH (ref 83.0–108.0)

## 2018-05-26 LAB — CBC
HCT: 30.7 % — ABNORMAL LOW (ref 39.0–52.0)
Hemoglobin: 11.4 g/dL — ABNORMAL LOW (ref 13.0–17.0)
MCH: 31.9 pg (ref 26.0–34.0)
MCHC: 37.1 g/dL — ABNORMAL HIGH (ref 30.0–36.0)
MCV: 86 fL (ref 80.0–100.0)
PLATELETS: 174 10*3/uL (ref 150–400)
RBC: 3.57 MIL/uL — AB (ref 4.22–5.81)
RDW: 13.2 % (ref 11.5–15.5)
WBC: 5.4 10*3/uL (ref 4.0–10.5)
nRBC: 0 % (ref 0.0–0.2)

## 2018-05-26 LAB — COMPREHENSIVE METABOLIC PANEL
ALK PHOS: 50 U/L (ref 38–126)
ALT: 21 U/L (ref 0–44)
AST: 20 U/L (ref 15–41)
Albumin: 2.3 g/dL — ABNORMAL LOW (ref 3.5–5.0)
Anion gap: 10 (ref 5–15)
BUN: 107 mg/dL — ABNORMAL HIGH (ref 8–23)
CALCIUM: 9.2 mg/dL (ref 8.9–10.3)
CO2: 19 mmol/L — ABNORMAL LOW (ref 22–32)
Chloride: 117 mmol/L — ABNORMAL HIGH (ref 98–111)
Creatinine, Ser: 3.07 mg/dL — ABNORMAL HIGH (ref 0.61–1.24)
GFR calc Af Amer: 20 mL/min — ABNORMAL LOW (ref >60)
GFR calc non Af Amer: 17 mL/min — ABNORMAL LOW (ref >60)
Glucose, Bld: 271 mg/dL — ABNORMAL HIGH (ref 70–99)
Potassium: 3.2 mmol/L — ABNORMAL LOW (ref 3.5–5.1)
Sodium: 146 mmol/L — ABNORMAL HIGH (ref 135–145)
Total Bilirubin: 0.7 mg/dL (ref 0.3–1.2)
Total Protein: 5.5 g/dL — ABNORMAL LOW (ref 6.5–8.1)

## 2018-05-26 LAB — PROTIME-INR
INR: 8.14
Prothrombin Time: 66.6 seconds — ABNORMAL HIGH (ref 11.4–15.2)

## 2018-05-26 LAB — MAGNESIUM: Magnesium: 2.2 mg/dL (ref 1.7–2.4)

## 2018-05-26 MED ORDER — POTASSIUM CHLORIDE IN NACL 20-0.45 MEQ/L-% IV SOLN
INTRAVENOUS | Status: DC
  Administered 2018-05-26: 14:00:00 via INTRAVENOUS
  Filled 2018-05-26 (×5): qty 1000

## 2018-05-26 MED ORDER — VITAMIN K1 10 MG/ML IJ SOLN
5.0000 mg | Freq: Once | INTRAVENOUS | Status: AC
  Administered 2018-05-26: 5 mg via INTRAVENOUS
  Filled 2018-05-26: qty 0.5

## 2018-05-26 NOTE — Progress Notes (Addendum)
Subjective: He has significantly deteriorated since I saw him yesterday.  He is much more dyspneic.  He is struggling to breathe.  He is on BiPAP and now on 60%.  He is mildly agitated.  Objective: Vital signs in last 24 hours: Temp:  [96.5 F (35.8 C)-97.8 F (36.6 C)] 97.6 F (36.4 C) (02/08 0755) Pulse Rate:  [69-131] 93 (02/08 0800) Resp:  [18-46] 18 (02/08 0700) BP: (113-164)/(57-127) 156/57 (02/08 0800) SpO2:  [88 %-100 %] 98 % (02/08 0800) FiO2 (%):  [45 %-60 %] 60 % (02/08 0312) Weight:  [65.1 kg] 65.1 kg (02/08 0500) Weight change:     Intake/Output from previous day: 02/07 0701 - 02/08 0700 In: 100 [IV Piggyback:100] Out: 1200 [Urine:1200]  PHYSICAL EXAM General appearance: He looks more short of breath.  He is somewhat agitated.  He is on BiPAP.  He is trying to communicate. Resp: He has much more rales and rhonchi on the right than he did yesterday.  He still has rales and rhonchi on the left. Cardio: irregularly irregular rhythm GI: soft, non-tender; bowel sounds normal; no masses,  no organomegaly Extremities: extremities normal, atraumatic, no cyanosis or edema  Lab Results:  Results for orders placed or performed during the hospital encounter of 05/22/18 (from the past 48 hour(s))  Protime-INR     Status: Abnormal   Collection Time: 05/25/18  4:31 AM  Result Value Ref Range   Prothrombin Time 61.7 (H) 11.4 - 15.2 seconds   INR 7.38 (HH)     Comment: REPEATED TO VERIFY CRITICAL RESULT CALLED TO, READ BACK BY AND VERIFIED WITH: MURPHY @ 0710 ON [id number] BY HENDERSON L. Performed at [facility], [address] [place], [place] [address]   CBC     Status: Abnormal   Collection Time: 05/25/18  4:31 AM  Result Value Ref Range   WBC 6.2 4.0 - 10.5 K/uL   RBC 3.74 (L) 4.22 - 5.81 MIL/uL   Hemoglobin 11.8 (L) 13.0 - 17.0 g/dL   HCT 32.2 (L) 39.0 - 52.0 %   MCV 86.1 80.0 - 100.0 fL   MCH 31.6 26.0 - 34.0 pg   MCHC 36.6 (H) 30.0 - 36.0 g/dL   RDW 12.9 11.5 -  15.5 %   Platelets 173 150 - 400 K/uL   nRBC 0.0 0.0 - 0.2 %    Comment: Performed at [facility], [address] [place], [place] [address]  Procalcitonin     Status: None   Collection Time: 05/25/18  4:31 AM  Result Value Ref Range   Procalcitonin 2.70 ng/mL    Comment:        Interpretation: PCT > 2 ng/mL: Systemic infection (sepsis) is likely, unless other causes are known. (NOTE)       Sepsis PCT Algorithm           Lower Respiratory Tract                                      Infection PCT Algorithm    ----------------------------     ----------------------------         PCT < 0.25 ng/mL                PCT < 0.10 ng/mL         Strongly encourage             Strongly discourage   discontinuation of  antibiotics    initiation of antibiotics    ----------------------------     -----------------------------       PCT 0.25 - 0.50 ng/mL            PCT 0.10 - 0.25 ng/mL               OR       >80% decrease in PCT            Discourage initiation of                                            antibiotics      Encourage discontinuation           of antibiotics    ----------------------------     -----------------------------         PCT >= 0.50 ng/mL              PCT 0.26 - 0.50 ng/mL               AND       <80% decrease in PCT              Encourage initiation of                                             antibiotics       Encourage continuation           of antibiotics    ----------------------------     -----------------------------        PCT >= 0.50 ng/mL                  PCT > 0.50 ng/mL               AND         increase in PCT                  Strongly encourage                                      initiation of antibiotics    Strongly encourage escalation           of antibiotics                                     -----------------------------                                           PCT <= 0.25 ng/mL                                                 OR                                         >  80% decrease in PCT                                     Discontinue / Do not initiate                                             antibiotics Performed at [facility], [address], [place], [place] [address]   Basic metabolic panel     Status: Abnormal   Collection Time: 05/25/18  4:31 AM  Result Value Ref Range   Sodium 142 135 - 145 mmol/L   Potassium 3.7 3.5 - 5.1 mmol/L    Comment: DELTA CHECK NOTED   Chloride 110 98 - 111 mmol/L   CO2 20 (L) 22 - 32 mmol/L   Glucose, Bld 320 (H) 70 - 99 mg/dL   BUN 106 (H) 8 - 23 mg/dL    Comment: RESULTS CONFIRMED BY MANUAL DILUTION   Creatinine, Ser 3.37 (H) 0.61 - 1.24 mg/dL   Calcium 9.2 8.9 - 10.3 mg/dL   GFR calc non Af Amer 16 (L) >60 mL/min   GFR calc Af Amer 18 (L) >60 mL/min   Anion gap 12 5 - 15    Comment: Performed at [facility], [address], [place], [place] [address]  Blood gas, arterial     Status: Abnormal   Collection Time: 05/25/18  8:34 AM  Result Value Ref Range   FIO2 36.00    pH, Arterial 7.378 7.350 - 7.450   pCO2 arterial 34.9 32.0 - 48.0 mmHg   pO2, Arterial 65.9 (L) 83.0 - 108.0 mmHg   Bicarbonate 21.2 20.0 - 28.0 mmol/L   Acid-base deficit 4.2 (H) 0.0 - 2.0 mmol/L   O2 Saturation 91.5 %   Patient temperature 36.4    Allens test (pass/fail) PASS PASS    Comment: Performed at [facility], [address] [place], [place] [address]  Protime-INR     Status: Abnormal   Collection Time: 05/26/18  5:13 AM  Result Value Ref Range   Prothrombin Time 66.6 (H) 11.4 - 15.2 seconds   INR 8.14 (HH)     Comment: REPEATED TO VERIFY CRITICAL RESULT CALLED TO, READ BACK BY AND VERIFIED WITH: SMITH @ [place] ON [id number] BY HENDERSON L. Performed at [facility], [address] [place], [place] [address]   CBC     Status: Abnormal   Collection Time: 05/26/18  5:13 AM  Result Value Ref Range   WBC 5.4 4.0 - 10.5 K/uL   RBC 3.57 (L) 4.22 - 5.81 MIL/uL   Hemoglobin 11.4 (L) 13.0 - 17.0 g/dL    HCT 30.7 (L) 39.0 - 52.0 %   MCV 86.0 80.0 - 100.0 fL   MCH 31.9 26.0 - 34.0 pg   MCHC 37.1 (H) 30.0 - 36.0 g/dL   RDW 13.2 11.5 - 15.5 %   Platelets 174 150 - 400 K/uL   nRBC 0.0 0.0 - 0.2 %    Comment: Performed at [facility], [address], [place], [place] [address]  Comprehensive metabolic panel     Status: Abnormal   Collection Time: 05/26/18  5:13 AM  Result Value Ref Range   Sodium 146 (H) 135 - 145 mmol/L   Potassium 3.2 (L) 3.5 - 5.1 mmol/L  Chloride 117 (H) 98 - 111 mmol/L   CO2 19 (L) 22 - 32 mmol/L   Glucose, Bld 271 (H) 70 - 99 mg/dL   BUN 107 (H) 8 - 23 mg/dL    Comment: RESULTS CONFIRMED BY MANUAL DILUTION   Creatinine, Ser 3.07 (H) 0.61 - 1.24 mg/dL   Calcium 9.2 8.9 - 10.3 mg/dL   Total Protein 5.5 (L) 6.5 - 8.1 g/dL   Albumin 2.3 (L) 3.5 - 5.0 g/dL   AST 20 15 - 41 U/L   ALT 21 0 - 44 U/L   Alkaline Phosphatase 50 38 - 126 U/L   Total Bilirubin 0.7 0.3 - 1.2 mg/dL   GFR calc non Af Amer 17 (L) >60 mL/min   GFR calc Af Amer 20 (L) >60 mL/min   Anion gap 10 5 - 15    Comment: Performed at [facility], [address], [place], [place] [address]  Magnesium     Status: None   Collection Time: 05/26/18  5:32 AM  Result Value Ref Range   Magnesium 2.2 1.7 - 2.4 mg/dL    Comment: Performed at [facility], [address], [place], [place] [address]    ABGS Recent Labs    05/25/18 0834  PHART 7.378  PO2ART 65.9*  HCO3 21.2   CULTURES Recent Results (from the past 240 hour(s))  Culture, blood (Routine x 2)     Status: None   Collection Time: 05/22/18  3:13 PM  Result Value Ref Range Status   Specimen Description BLOOD LEFT FOREARM  Final   Special Requests   Final    BOTTLES DRAWN AEROBIC AND ANAEROBIC Blood Culture adequate volume   Culture   Final    NO GROWTH 84 DAYS Performed at [facility], [address] [place], [place] [address]    Report Status 05/23/2018 FINAL  Final  Culture, blood (Routine x 2)     Status: None (Preliminary result)    Collection Time: 05/22/18  3:15 PM  Result Value Ref Range Status   Specimen Description BLOOD RIGHT WRIST  Final   Special Requests   Final    BOTTLES DRAWN AEROBIC AND ANAEROBIC Blood Culture adequate volume   Culture   Final    NO GROWTH 4 DAYS Performed at [facility], [address] [place], [place] [address]    Report Status PENDING  Incomplete  Urine culture     Status: None   Collection Time: 05/22/18 10:56 PM  Result Value Ref Range Status   Specimen Description   Final    URINE, RANDOM Performed at [facility], [address], [place], [place] [address]    Special Requests   Final    NONE Performed at [facility], [address] [place], [place] [address]    Culture   Final    NO GROWTH Performed at [facility], [place] [address], [place], [place] [address]    Report Status 05/24/2018 FINAL  Final  MRSA PCR Screening     Status: None   Collection Time: 05/23/18 12:35 AM  Result Value Ref Range Status   MRSA by PCR NEGATIVE NEGATIVE Final    Comment:        The GeneXpert MRSA Assay (FDA approved for NASAL specimens only), is one component of a comprehensive MRSA colonization surveillance program. It is not intended to diagnose MRSA infection nor to guide or monitor treatment for MRSA infections. Performed at [facility], [address] [place], [place]  [address]   Respiratory Panel by PCR     Status: None   Collection Time: 05/23/18 10:12 AM  Result Value Ref Range Status   Adenovirus NOT DETECTED NOT DETECTED Final   Coronavirus 229E NOT DETECTED NOT DETECTED Final    Comment: (NOTE) The Coronavirus on the Respiratory Panel, DOES NOT test for the novel  Coronavirus (2019 nCoV)    Coronavirus HKU1 NOT DETECTED NOT DETECTED Final   Coronavirus NL63 NOT DETECTED NOT DETECTED Final   Coronavirus OC43 NOT DETECTED NOT DETECTED Final   Metapneumovirus NOT DETECTED NOT DETECTED Final   Rhinovirus / Enterovirus NOT DETECTED NOT DETECTED Final    Influenza A NOT DETECTED NOT DETECTED Final   Influenza B NOT DETECTED NOT DETECTED Final   Parainfluenza Virus 1 NOT DETECTED NOT DETECTED Final   Parainfluenza Virus 2 NOT DETECTED NOT DETECTED Final   Parainfluenza Virus 3 NOT DETECTED NOT DETECTED Final   Parainfluenza Virus 4 NOT DETECTED NOT DETECTED Final   Respiratory Syncytial Virus NOT DETECTED NOT DETECTED Final   Bordetella pertussis NOT DETECTED NOT DETECTED Final   Chlamydophila pneumoniae NOT DETECTED NOT DETECTED Final   Mycoplasma pneumoniae NOT DETECTED NOT DETECTED Final    Comment: Performed at [facility], [place]. [address] [place], [place] [address]   Studies/Results: Dg Chest Port 1 View  Result Date: 05/25/2018 CLINICAL DATA:  Pneumonia, shortness of breath EXAM: PORTABLE CHEST 1 VIEW COMPARISON:  05/24/2018 FINDINGS: Stable AP portable examination with very dense consolidation of the left lung and volume loss with elevation of the left hemidiaphragm. No new airspace opacity. The right lung is normally aerated. The cardiomediastinal silhouette is largely obscured. IMPRESSION: Stable AP portable examination with very dense consolidation of the left lung and volume loss with elevation of the left hemidiaphragm. No new airspace opacity. The right lung is normally aerated. The cardiomediastinal silhouette is largely obscured. Electronically Signed   By: Eddie Candle M.D.   On: 05/25/2018 13:49   Dg Chest Port 1 View  Result Date: 05/24/2018 CLINICAL DATA:  Follow-up pneumonia EXAM: PORTABLE CHEST 1 VIEW COMPARISON:  05/22/2018 FINDINGS: Check shadow is stable. Persistent elevation of the left hemidiaphragm is noted with left basilar infiltrate. The overall appearance is stable. Old rib fractures are noted on the right. No focal infiltrate on the right is seen. IMPRESSION: Stable left-sided infiltrate. Electronically Signed   By: Inez Catalina M.D.   On: 05/24/2018 10:50    Medications:  Prior to Admission:  Medications  Prior to Admission  Medication Sig Dispense Refill Last Dose  . amiodarone (PACERONE) 200 MG tablet TAKE 1 TABLET BY MOUTH ONCE DAILY (Patient taking differently: Take 200 mg by mouth daily. ) 30 tablet 9 05/21/2018 at Unknown time  . azithromycin (ZITHROMAX Z-PAK) 250 MG tablet As directed (Patient taking differently: Take 250-500 mg by mouth See admin instructions. As directed starting on 05/21/2018 take 500mg  on day 1 then take 250mg  on days 2 through 5) 6 tablet 0 05/21/2018 at Unknown time  . chlorthalidone (HYGROTON) 25 MG tablet Take 1 tablet (25 mg total) by mouth daily. 90 tablet 3 05/21/2018 at Unknown time  . diltiazem (CARDIZEM CD) 240 MG 24 hr capsule Take 1 capsule (240 mg total) by mouth daily. 30 capsule 6 05/21/2018 at Unknown time  . levothyroxine (SYNTHROID, LEVOTHROID) 88 MCG tablet Take 1 tablet (88 mcg total) by mouth daily before breakfast. 90 tablet 3 05/21/2018 at Unknown time  . warfarin (COUMADIN) 2 MG tablet  Take 1 tablet (2 mg total) by mouth daily. 90 tablet 3 05/20/2018 at Unknown time   Scheduled: . amiodarone  200 mg Oral Daily  . budesonide (PULMICORT) nebulizer solution  0.5 mg Nebulization BID  . chlorhexidine  15 mL Mouth Rinse BID  . diltiazem  240 mg Oral Daily  . guaiFENesin  600 mg Oral BID  . ipratropium-albuterol  3 mL Nebulization Q4H  . levothyroxine  88 mcg Oral Q0600  . mouth rinse  15 mL Mouth Rinse q12n4p  . methylPREDNISolone (SOLU-MEDROL) injection  60 mg Intravenous Q8H  . Warfarin - Pharmacist Dosing Inpatient   Does not apply q1800   Continuous: . 0.45 % NaCl with KCl 20 mEq / L    . levofloxacin (LEVAQUIN) IV 500 mg (05/25/18 1451)  . metronidazole 500 mg (05/26/18 0228)  . phytonadione (VITAMIN K) IV     JTT:[id number] **OR** acetaminophen, guaiFENesin-dextromethorphan, haloperidol lactate, haloperidol lactate, LORazepam, ondansetron **OR** ondansetron (ZOFRAN) IV  Assesment: He was admitted with community-acquired pneumonia.  He is positive  for Legionella and he is on Levaquin for that.  I have concerns that he is aspirating and he is on Flagyl to cover aspiration.  He does list a penicillin allergy.  He is clearly worse.  He is requiring BiPAP with 60% oxygen.  Chest x-ray which I have personally reviewed shows the pneumonia looks more dense and more extensive.? RUL  Infiltrate.  He was septic on admission.  He has atrial fib with rapid ventricular response.  He has been uncomfortable and agitated and has received Ativan but that does not appear to have been very effective  He is chronically anticoagulated and his INR has continued to go out. Active Problems:   CAP (community acquired pneumonia)   Acute respiratory failure with hypoxia ([place])   Acute renal failure superimposed on chronic kidney disease, on chronic dialysis (HCC)   AKI (acute kidney injury) ([place])   Sepsis ([place])   Lobar pneumonia (HCC)   Legionella pneumonia ([place])    Plan: I think is likely that he is going to need to be intubated and placed on mechanical ventilation.  We are awaiting arterial blood gas.  Discussed with Dr. Wynetta Emery hospitalist attending    LOS: 4 days   Larry Gates 05/26/2018, 8:33 AM

## 2018-05-26 NOTE — Progress Notes (Signed)
RT called to assess patient on BIPAP. Patient was on BIPAP PS FIO2 45%, PEEP 8, PS 8. Patient very uncomfortable with RR in lower 40's. RT switched mode on BIPAP to PC with FIO2 45%, PEEP 6, RR 18, PC 12. Patient seems to be tolerating better at this time. RR had slowed from 42 to 28 at last check in. Will continue to monitor.

## 2018-05-26 NOTE — Progress Notes (Signed)
[place] for warfarin Indication: atrial fibrillation  Allergies  Allergen Reactions  . Penicillins Rash    Patient Measurements: Height: 5\' 6"  (167.6 cm) Weight: 143 lb 8.3 oz (65.1 kg) IBW/kg (Calculated) : 63.8   Vital Signs: Temp: 97.6 F (36.4 C) (02/08 0755) Temp Source: Axillary (02/08 0755) BP: 156/57 (02/08 0800) Pulse Rate: 93 (02/08 0800)  Labs: Recent Labs    05/24/18 0426 05/25/18 0431 05/26/18 0513  HGB 11.2* 11.8* 11.4*  HCT 30.4* 32.2* 30.7*  PLT 136* 173 174  LABPROT 48.5* 61.7* 66.6*  INR 5.40* 7.38* 8.14*  CREATININE 3.88* 3.37* 3.07*    Estimated Creatinine Clearance: 15.3 mL/min (A) (by C-G formula based on SCr of 3.07 mg/dL (H)).   Medical History: Past Medical History:  Diagnosis Date  . Anticoagulated on Coumadin    a. Followed @ [facility]  . Atypical atrial flutter ([place])    a. Dx 07/2010 and coumadin initiated;  b. 08/2010 s/p DCCV.  . Cataract   . Hard of hearing   . Midsternal chest pain    a. In setting of A Flutter 07/2010;  b. Cath 07/2010: nonobs dzs  . Prostate cancer ([place])   . TIA (transient ischemic attack)   . Vertigo     Medications:  Medications Prior to Admission  Medication Sig Dispense Refill Last Dose  . amiodarone (PACERONE) 200 MG tablet TAKE 1 TABLET BY MOUTH ONCE DAILY (Patient taking differently: Take 200 mg by mouth daily. ) 30 tablet 9 05/21/2018 at Unknown time  . azithromycin (ZITHROMAX Z-PAK) 250 MG tablet As directed (Patient taking differently: Take 250-500 mg by mouth See admin instructions. As directed starting on 05/21/2018 take 500mg  on day 1 then take 250mg  on days 2 through 5) 6 tablet 0 05/21/2018 at Unknown time  . chlorthalidone (HYGROTON) 25 MG tablet Take 1 tablet (25 mg total) by mouth daily. 90 tablet 3 05/21/2018 at Unknown time  . diltiazem (CARDIZEM CD) 240 MG 24 hr capsule Take 1 capsule (240 mg total) by mouth daily. 30 capsule 6 05/21/2018 at Unknown time  .  levothyroxine (SYNTHROID, LEVOTHROID) 88 MCG tablet Take 1 tablet (88 mcg total) by mouth daily before breakfast. 90 tablet 3 05/21/2018 at Unknown time  . warfarin (COUMADIN) 2 MG tablet Take 1 tablet (2 mg total) by mouth daily. 90 tablet 3 05/20/2018 at Unknown time    Assessment: Pharmacy consulted to dose warfarin in patient with atrial fibrillation. INR is supratherapeutic today. Last dose of coumadin 2/7 and patient on azithromycin. INR is still trending up, MD ordered Vit K 5mg  IV x 1 dose 2/8.  Home dose is listed as warfarin 2 mg daily.  Goal of Therapy:  INR 2-3 Monitor platelets by anticoagulation protocol: Yes   Plan:  No coumadin today Monitor labs and s/s of bleeding.  Isac Sarna, BS Vena [place], BCPS Clinical Pharmacist Pager [phone] 05/26/2018,8:51 AM

## 2018-05-26 NOTE — Progress Notes (Signed)
05/26/2018 1:08 PM  Family insisting that patient be transferred to higher level of care.  [facility] has no beds available at this time.  Family requested that I call [facility].  I called the Doctor Connect line at [facility] and spoke with Dr. Charlann Lange, reviewed the case with her and she has accepted the patient for transfer to intermediate bed.  If patient becomes intubated, then we should update them that patient will need ICU bed.  Family updated and RN updated.  Murvin Natal MD

## 2018-05-26 NOTE — Progress Notes (Signed)
PROGRESS NOTE                                                                                                                                                                                                             Patient Demographics:    Larry Gates, is a 85 y.o. male, DOB - 24-Jun-1928, PXT:[id number]  Admit date - 05/22/2018   Admitting Physician Courage Denton Brick, MD  Outpatient Primary MD for the patient is Claretta Fraise, MD  LOS - 4  Outpatient Specialists: None  No chief complaint on file.     Brief Narrative 85 year old male with a flutter on warfarin, prostate cancer, TIA, hypertension, hyperlipidemia presenting with confusion, generalized weakness, increasing shortness of breath since 1/31 when symptoms started with cough and chest congestion.  Patient seen by his PCP on 2/3 and was diagnosed with pneumonia and started on azithromycin.  However symptoms continue to worsen.  He also had loose stools for 2 days prior to admission with progressive generalized weakness. In the ED he was septic with fever of 103 F, tachycardic and hypoxic requiring BiPAP due to increased work of breathing. Admitted to stepdown unit.   Subjective:   Pt having more respiratory distress and not tolerating bipap very well.     Assessment  & Plan :   Sepsis Secondary to Legionella pneumonia with concern for aspiration as well.  Antibiotic broadened by adding Flagyl.  Continue Rocephin and azithromycin.  Acute respiratory failure with hypoxia Secondary to left lobar pneumonia due to Legionella.  He is having more respiratory distress and may require intubation.  I discussed with Dr. Luan Pulling and he has requested ABG, repeat CXR and will decide about intubation when results are available.  Pulmonary Dr. Luan Pulling following and recommended to continue steroids, nebs and broadened antibiotic by adding Flagyl with concern for  aspiration. Continue BiPAP for now with high risk for needing intubation. On dysphagia level 3 diet with risk for aspiration.  Acute kidney injury  Likely prerenal ATN with sepsis. IV fluids ordered.  Renal function slowly improving and has good urine output.  Renal ultrasound shows medical renal disease (previously his renal function have been normal). Avoid nephrotoxins and monitor.  Renal function slowly trending down.  Monitor I/O.  A flutter with supratherapeutic INR Supratherapeutic INR with antibiotic use and sepsis.  Coumadin on hold for  last 3 days and should slowly improve from tomorrow.  Vitamin K ordered this morning.   No signs of bleeding.[name]  Heart rate controlled on Cardizem.  Continue amiodarone.  Hypothyroidism Continue Synthroid.  TSH normal.  Essential hypertension Chlorthalidone on hold.  Hypokalemia Replenished.  Magnesium normal  Diarrhea Resolved. Continue stepdown monitoring as patient requiring frequent BiPAP and high risk for intubation.  Code Status : Full code  Family Communication  : Son at bedside  Disposition Plan  : Continue inpatient care.  Possibly needs SNF upon discharge.  Barriers For Discharge : Active symptoms  Consults  : Pulmonary  Procedures  : Renal ultrasound  DVT Prophylaxis  : Supratherapeutic INR  Lab Results  Component Value Date   PLT 174 05/26/2018   Antibiotics  :   Anti-infectives (From admission, onward)   Start     Dose/Rate Route Frequency Ordered Stop   05/25/18 1200  metroNIDAZOLE (FLAGYL) IVPB 500 mg  Status:  Discontinued     500 mg 100 mL/hr over 60 Minutes Intravenous Every 6 hours 05/25/18 0925 05/25/18 0926   05/25/18 1200  metroNIDAZOLE (FLAGYL) IVPB 500 mg  Status:  Discontinued     500 mg 100 mL/hr over 60 Minutes Intravenous Every 6 hours 05/25/18 0956 05/25/18 1052   05/25/18 1200  levofloxacin (LEVAQUIN) IVPB 500 mg     500 mg 100 mL/hr over 60 Minutes Intravenous Every 48 hours 05/25/18 1153      05/25/18 1100  metroNIDAZOLE (FLAGYL) IVPB 500 mg     500 mg 100 mL/hr over 60 Minutes Intravenous Every 8 hours 05/25/18 1052     05/22/18 1545  cefTRIAXone (ROCEPHIN) 2 g in sodium chloride 0.9 % 100 mL IVPB  Status:  Discontinued     2 g 200 mL/hr over 30 Minutes Intravenous Every 24 hours 05/22/18 1532 05/25/18 1153   05/22/18 1545  azithromycin (ZITHROMAX) 500 mg in sodium chloride 0.9 % 250 mL IVPB  Status:  Discontinued     500 mg 250 mL/hr over 60 Minutes Intravenous Every 24 hours 05/22/18 1532 05/25/18 1153        Objective:   Vitals:   05/26/18 0600 05/26/18 0700 05/26/18 0755 05/26/18 0800  BP: 137/89 113/62  (!) 156/57  Pulse: (!) 111 81  93  Resp: (!) 21 18    Temp:   97.6 F (36.4 C)   TempSrc:   Axillary   SpO2: 97% 99%  98%  Weight:      Height:        Wt Readings from Last 3 Encounters:  05/26/18 65.1 kg  05/21/18 63.5 kg  02/14/18 62 kg    Intake/Output Summary (Last 24 hours) at 05/26/2018 0847 Last data filed at 05/26/2018 0743 Gross per 24 hour  Intake 100 ml  Output 1200 ml  Net -1100 ml   Physical exam Chronically ill appearing male in moderate resp distress.  HEENT: NCAT, supple neck, no thyromegaly.  Chest: Diffuse wheezing, rales and coarse breath sounds bilaterally CVS: S1-S2 regular, no murmurs GI: Soft, nondistended, nontender Musculoskeletal: Warm, no edema CNS: Hard of hearing, No focal deficits.     Data Review:    CBC Recent Labs  Lab 05/22/18 1513 05/23/18 0443 05/24/18 0426 05/25/18 0431 05/26/18 0513  WBC 8.4 5.9 3.3* 6.2 5.4  HGB 13.7 11.5* 11.2* 11.8* 11.4*  HCT 37.7* 30.9* 30.4* 32.2* 30.7*  PLT 133* 114* 136* 173 174  MCV 87.1 85.8 87.1 86.1 86.0  MCH 31.6 31.9 32.1  31.6 31.9  MCHC 36.3* 37.2* 36.8* 36.6* 37.1*  RDW 13.2 12.9 12.8 12.9 13.2  LYMPHSABS 0.2*  --   --   --   --   MONOABS 0.2  --   --   --   --   EOSABS 0.0  --   --   --   --   BASOSABS 0.0  --   --   --   --     Chemistries  Recent Labs   Lab 05/22/18 1513 05/23/18 0443 05/24/18 0426 05/25/18 0431 05/26/18 0513 05/26/18 0532  NA 135 140 143 142 146*  --   K 3.2* 3.5 3.0* 3.7 3.2*  --   CL 100 110 113* 110 117*  --   CO2 20* 18* 18* 20* 19*  --   GLUCOSE 140* 107* 196* 320* 271*  --   BUN 99* 100* 104* 106* 107*  --   CREATININE 4.58* 4.25* 3.88* 3.37* 3.07*  --   CALCIUM 8.5* 8.0* 8.9 9.2 9.2  --   MG 2.0  --  2.3  --   --  2.2  AST 37  --   --   --  20  --   ALT 27  --   --   --  21  --   ALKPHOS 54  --   --   --  50  --   BILITOT 0.8  --   --   --  0.7  --    ------------------------------------------------------------------------------------------------------------------ No results for input(s): CHOL, HDL, LDLCALC, TRIG, CHOLHDL, LDLDIRECT in the last 72 hours.  Lab Results  Component Value Date   HGBA1C 4.3 04/01/2016   ------------------------------------------------------------------------------------------------------------------ No results for input(s): TSH, T4TOTAL, T3FREE, THYROIDAB in the last 72 hours.  Invalid input(s): FREET3 ------------------------------------------------------------------------------------------------------------------ No results for input(s): VITAMINB12, FOLATE, FERRITIN, TIBC, IRON, RETICCTPCT in the last 72 hours.  Coagulation profile Recent Labs  Lab 05/22/18 1513 05/23/18 0816 05/24/18 0426 05/25/18 0431 05/26/18 0513  INR 2.69 3.37 5.40* 7.38* 8.14*    No results for input(s): DDIMER in the last 72 hours.  Cardiac Enzymes No results for input(s): CKMB, TROPONINI, MYOGLOBIN in the last 168 hours.  Invalid input(s): CK ------------------------------------------------------------------------------------------------------------------ No results found for: BNP  Inpatient Medications  Scheduled Meds: . amiodarone  200 mg Oral Daily  . budesonide (PULMICORT) nebulizer solution  0.5 mg Nebulization BID  . chlorhexidine  15 mL Mouth Rinse BID  . diltiazem   240 mg Oral Daily  . guaiFENesin  600 mg Oral BID  . ipratropium-albuterol  3 mL Nebulization Q4H  . levothyroxine  88 mcg Oral Q0600  . mouth rinse  15 mL Mouth Rinse q12n4p  . methylPREDNISolone (SOLU-MEDROL) injection  60 mg Intravenous Q8H  . Warfarin - Pharmacist Dosing Inpatient   Does not apply q1800   Continuous Infusions: . 0.45 % NaCl with KCl 20 mEq / L    . levofloxacin (LEVAQUIN) IV 500 mg (05/25/18 1451)  . metronidazole 500 mg (05/26/18 0228)  . phytonadione (VITAMIN K) IV     PRN Meds:.acetaminophen **OR** acetaminophen, guaiFENesin-dextromethorphan, haloperidol lactate, haloperidol lactate, LORazepam, ondansetron **OR** ondansetron (ZOFRAN) IV  Micro Results Recent Results (from the past 240 hour(s))  Culture, blood (Routine x 2)     Status: None   Collection Time: 05/22/18  3:13 PM  Result Value Ref Range Status   Specimen Description BLOOD LEFT FOREARM  Final   Special Requests   Final    BOTTLES DRAWN AEROBIC AND ANAEROBIC  Blood Culture adequate volume   Culture   Final    NO GROWTH 84 DAYS Performed at [facility], [address] [place], [place] [address]    Report Status 05/23/2018 FINAL  Final  Culture, blood (Routine x 2)     Status: None (Preliminary result)   Collection Time: 05/22/18  3:15 PM  Result Value Ref Range Status   Specimen Description BLOOD RIGHT WRIST  Final   Special Requests   Final    BOTTLES DRAWN AEROBIC AND ANAEROBIC Blood Culture adequate volume   Culture   Final    NO GROWTH 4 DAYS Performed at [facility], [address], [place], [place] [address]    Report Status PENDING  Incomplete  Urine culture     Status: None   Collection Time: 05/22/18 10:56 PM  Result Value Ref Range Status   Specimen Description   Final    URINE, RANDOM Performed at [facility], [address], [place], [place] [address]    Special Requests   Final    NONE Performed at [facility], [address], [place], [place] [address]    Culture    Final    NO GROWTH Performed at [facility], [place] [address] [place], [place] [address]    Report Status 05/24/2018 FINAL  Final  MRSA PCR Screening     Status: None   Collection Time: 05/23/18 12:35 AM  Result Value Ref Range Status   MRSA by PCR NEGATIVE NEGATIVE Final    Comment:        The GeneXpert MRSA Assay (FDA approved for NASAL specimens only), is one component of a comprehensive MRSA colonization surveillance program. It is not intended to diagnose MRSA infection nor to guide or monitor treatment for MRSA infections. Performed at [facility], [address], [place], [place] [address]   Respiratory Panel by PCR     Status: None   Collection Time: 05/23/18 10:12 AM  Result Value Ref Range Status   Adenovirus NOT DETECTED NOT DETECTED Final   Coronavirus 229E NOT DETECTED NOT DETECTED Final    Comment: (NOTE) The Coronavirus on the Respiratory Panel, DOES NOT test for the novel  Coronavirus (2019 nCoV)    Coronavirus HKU1 NOT DETECTED NOT DETECTED Final   Coronavirus NL63 NOT DETECTED NOT DETECTED Final   Coronavirus OC43 NOT DETECTED NOT DETECTED Final   Metapneumovirus NOT DETECTED NOT DETECTED Final   Rhinovirus / Enterovirus NOT DETECTED NOT DETECTED Final   Influenza A NOT DETECTED NOT DETECTED Final   Influenza B NOT DETECTED NOT DETECTED Final   Parainfluenza Virus 1 NOT DETECTED NOT DETECTED Final   Parainfluenza Virus 2 NOT DETECTED NOT DETECTED Final   Parainfluenza Virus 3 NOT DETECTED NOT DETECTED Final   Parainfluenza Virus 4 NOT DETECTED NOT DETECTED Final   Respiratory Syncytial Virus NOT DETECTED NOT DETECTED Final   Bordetella pertussis NOT DETECTED NOT DETECTED Final   Chlamydophila pneumoniae NOT DETECTED NOT DETECTED Final   Mycoplasma pneumoniae NOT DETECTED NOT DETECTED Final    Comment: Performed at [facility], [place] [address], [place], [place] [address]   Radiology Reports Dg Chest 2 View  Result Date: 05/21/2018 CLINICAL  DATA:  Cough EXAM: CHEST - 2 VIEW COMPARISON:  None. FINDINGS: Cardiomegaly. There is marked elevation of the left hemidiaphragm with a dense, heterogeneous opacity of the left lung base. Chronic fracture deformities of the right ribs and age-indeterminate vertebral body endplate deformities of the lower  thoracic and upper lumbar spine. IMPRESSION: There is marked elevation of the left hemidiaphragm with a dense, heterogeneous opacity of the left lung base, concerning for infection or aspiration. Recommend follow-up radiographs in 6-8 weeks to ensure complete resolution and exclude underlying malignancy. Electronically Signed   By: Eddie Candle M.D.   On: 05/21/2018 10:01   US Renal  Result Date: 05/23/2018 CLINICAL DATA:  Acute on chronic renal failure. EXAM: RENAL / URINARY TRACT ULTRASOUND COMPLETE COMPARISON:  CT scan of November 09, 2006. FINDINGS: Right Kidney: Renal measurements: 10.2 x 5.7 x 5.0 cm = volume: 151 mL. Mildly increased echogenicity of renal parenchyma is noted. No mass or hydronephrosis visualized. Left Kidney: Renal measurements: 11.4 x 5.6 x 5.3 cm = volume: 177 mL. Mildly increased echogenicity of renal parenchyma is noted. No hydronephrosis visualized. 5.6 cm cyst with single thick septa is noted concerning for Bosniak type 2 lesion. Bladder: Appears normal for degree of bladder distention. No ureteral jets are noted. IMPRESSION: Increased echogenicity of renal parenchyma is noted bilaterally consistent with medical renal disease. 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 Follow-up ultrasound in 1 year is recommended to ensure stability. Electronically Signed   By: Marijo Conception, M.D.   On: 05/23/2018 12:58   Dg Chest Port 1 View  Result Date: 05/25/2018 CLINICAL DATA:  Pneumonia, shortness of breath EXAM: PORTABLE CHEST 1 VIEW COMPARISON:  05/24/2018 FINDINGS: Stable AP portable examination with very dense consolidation of the left lung and volume loss with  elevation of the left hemidiaphragm. No new airspace opacity. The right lung is normally aerated. The cardiomediastinal silhouette is largely obscured. IMPRESSION: Stable AP portable examination with very dense consolidation of the left lung and volume loss with elevation of the left hemidiaphragm. No new airspace opacity. The right lung is normally aerated. The cardiomediastinal silhouette is largely obscured. Electronically Signed   By: Eddie Candle M.D.   On: 05/25/2018 13:49   Dg Chest Port 1 View  Result Date: 05/24/2018 CLINICAL DATA:  Follow-up pneumonia EXAM: PORTABLE CHEST 1 VIEW COMPARISON:  05/22/2018 FINDINGS: Check shadow is stable. Persistent elevation of the left hemidiaphragm is noted with left basilar infiltrate. The overall appearance is stable. Old rib fractures are noted on the right. No focal infiltrate on the right is seen. IMPRESSION: Stable left-sided infiltrate. Electronically Signed   By: Inez Catalina M.D.   On: 05/24/2018 10:50   Dg Chest Portable 1 View  Result Date: 05/22/2018 CLINICAL DATA:  POSSIBLE SEPSIS. SEEN BY [facility] YESTERDAY AND DIAGNOSED WITH PNEUMONIA AND GIVEN Z-PAK. SOB MUCH WORSE TODAY.HX: NON-SMOKER, A-FIB,PNEUMONIA. EXAM: PORTABLE CHEST 1 VIEW COMPARISON:  05/21/2018 FINDINGS: Shallow lung inflation. Patient is rotated favoring the LEFT side. There is increased opacity of LEFT LOWER lobe and probable LEFT UPPER lobe infiltrate, associated with elevation of LEFT hemidiaphragm which appear stable. Numerous remote RIGHT rib fractures are identified. The RIGHT lung is otherwise clear. There is atherosclerotic calcification of the thoracic aorta. IMPRESSION: Increased density and size of LEFT pulmonary infiltrate. Suspect involvement of the LEFT UPPER lobe as well as the LEFT LOWER lobe. Stable elevation of the LEFT hemidiaphragm. Electronically Signed   By: Nolon Nations M.D.   On: 05/22/2018 16:39   Critical care Time Spent in minutes  36  minutes  Irwin Brakeman M.D on 05/26/2018 at 8:47 AM

## 2018-05-26 NOTE — Discharge Summary (Addendum)
Physician Discharge Summary  Larry Gates HRC:[id number] DOB: Feb 27, 1929 DOA: 05/22/2018  PCP: Claretta Fraise, MD  Admit date: 05/22/2018 Discharge date: 05/26/2018  1. Disposition: TRANSFER TO [facility]  Discharge Condition: GUARDED  CODE STATUS: FULL    Brief Hospitalization Summary: Please see all hospital notes, images, labs for full details of the hospitalization.  HPI: Larry Gates is a 85 y.o. male with medical history significant for Atrial fibrillation/flutter, HTN , presented to the ED with complaints of difficulty breathing and productive cough of about 3 to 4 days duration.  Also with generalized weakness, feeling dizzy, cannot walk.  Patient lives alone.  Patient always reports loose stools over the past 1-2 days, without vomiting. Patient's saw his primary care provider yesterday was diagnosed with a pneumonia and prescribed azithromycin.  The patient's felt worse so presented to the ED.  ED Course: temp 103, blood pressure systolic 680- 321Y.  INR 2.6.  WBC 8.4.  Normal lactic acid 1.7.  ABG pH 7.4, PCO2 33, hypoxic with O2 sat 63.  Two-view chest x-ray shows left upper and lower lobe pulmonary infiltrate.  Negative influenza panel.  Blood and urine cultures obtained.  Started on IV ceftriaxone and azithromycin.  Patient was placed on BiPAP as he was tachypneic, O2 sats 90% on RA. Hospitalist to admit for pneumonia.  Brief Narrative 85 year old male with a flutter on warfarin, prostate cancer, TIA, hypertension, hyperlipidemia presenting with confusion, generalized weakness, increasing shortness of breath since 1/31 when symptoms started with cough and chest congestion.  Patient seen by his PCP on 2/3 and was diagnosed with pneumonia and started on azithromycin.  However symptoms continue to worsen.  He also had loose stools for 2 days prior to admission with progressive generalized weakness. In the ED he was septic with fever of 103 F, tachycardic and hypoxic  requiring BiPAP due to increased work of breathing. Admitted to stepdown unit.  Sepsis Secondary to Legionella pneumonia with concern for aspiration as well.  Antibiotic broadened by adding Flagyl.  Continue Rocephin and azithromycin.  Acute respiratory failure with hypoxia Secondary to left lobar pneumonia due to Legionella.  He is having more respiratory distress and may require intubation.  I discussed with Dr. Luan Pulling and he has requested ABG, repeat CXR.  Pulmonary Dr. Luan Pulling following and recommended to continue steroids, nebs and broadened antibiotic by adding Flagyl with concern for aspiration. Continue BiPAP for now with high risk for needing intubation. On dysphagia level 3 diet with risk for aspiration.  ABG    Component Value Date/Time   PHART 7.391 05/26/2018 0900   PCO2ART 35.3 05/26/2018 0900   PO2ART 126 (H) 05/26/2018 0900   HCO3 22.2 05/26/2018 0900   TCO2 23 05/04/2013 1340   ACIDBASEDEF 3.2 (H) 05/26/2018 0900   O2SAT 99.1 05/26/2018 0900   Patient's family requested for transfer to higher level of care.  [facility] called [facility] but no beds available. Family requested I contact [facility].  I called the [facility] line and discussed care and case with Dr. Charlann Lange who accepted patient for transfer.  Pt is being [facility] to [facility] by critical care transport team.  EMTALA completed.   Acute kidney injury  Likely prerenal ATN with sepsis. IV fluids ordered.  Renal function slowly improving and has good urine output.  Renal ultrasound shows medical renal disease (previously his renal function have been normal). Avoid nephrotoxins and monitor.  Renal function slowly trending down.  Monitor I/O.  A flutter with supratherapeutic  INR Supratherapeutic INR with antibiotic use and sepsis.  Coumadin on hold for last 3 days and should slowly improve from tomorrow.  Vitamin K 5 mg IV ordered this morning.   No signs of bleeding.  Heart rate controlled on Cardizem.  Continue  amiodarone.  Hypothyroidism Continue Synthroid.  TSH normal.  Essential hypertension Chlorthalidone on hold.  Hypokalemia Replenished.  Magnesium normal  Diarrhea Resolved.  Continue stepdown monitoring as patient requiring frequent BiPAP and high risk for intubation.  Code Status : Full code  Family Communication  : Son at bedside  Disposition Plan  : Continue inpatient care.  Possibly needs SNF upon discharge.  Barriers For Discharge : Active symptoms  Consults  : Pulmonary  Procedures  : Renal ultrasound  DVT Prophylaxis  : Supratherapeutic INR Discharge Diagnoses:  Active Problems:   CAP (community acquired pneumonia)   Acute respiratory failure with hypoxia ([place])   Acute renal failure superimposed on chronic kidney disease, on chronic dialysis ([place])   AKI (acute kidney injury) ([place])   Sepsis ([place])   Lobar pneumonia ([place])   Legionella pneumonia ([place])  Prior to Admission medications   Medication Sig Start Date End Date Taking? Authorizing Provider  amiodarone (PACERONE) 200 MG tablet TAKE 1 TABLET BY MOUTH ONCE DAILY Patient taking differently: Take 200 mg by mouth daily.  01/26/18  Yes Minus Breeding, MD  azithromycin (ZITHROMAX Z-PAK) 250 MG tablet As directed Patient taking differently: Take 250-500 mg by mouth See admin instructions. As directed starting on 05/21/2018 take 500mg  on day 1 then take 250mg  on days 2 through 5 05/21/18  Yes Hassell Done, Mary-Margaret, FNP  chlorthalidone (HYGROTON) 25 MG tablet Take 1 tablet (25 mg total) by mouth daily. 01/31/18  Yes Minus Breeding, MD  diltiazem (CARDIZEM CD) 240 MG 24 hr capsule Take 1 capsule (240 mg total) by mouth daily. 02/14/18  Yes Minus Breeding, MD  levothyroxine (SYNTHROID, LEVOTHROID) 88 MCG tablet Take 1 tablet (88 mcg total) by mouth daily before breakfast. 05/15/17  Yes Claretta Fraise, MD  warfarin (COUMADIN) 2 MG tablet Take 1 tablet (2 mg total) by mouth daily. 06/02/17  Yes Memory Argue, PharmD   CURRENT MEDICATIONS    Current Facility-Administered Medications:  .  0.45 % NaCl with KCl 20 mEq / L infusion, , Intravenous, Continuous, Jyl Chico L, MD, Last Rate: 75 mL/hr at 05/26/18 1343 .  acetaminophen (TYLENOL) tablet 650 mg, 650 mg, Oral, Q6H PRN **OR** acetaminophen (TYLENOL) suppository 650 mg, 650 mg, Rectal, Q6H PRN, Emokpae, Ejiroghene E, MD, 650 mg at 05/25/18 2246 .  amiodarone (PACERONE) tablet 200 mg, 200 mg, Oral, Daily, Emokpae, Ejiroghene E, MD, 200 mg at 05/25/18 0908 .  budesonide (PULMICORT) nebulizer solution 0.5 mg, 0.5 mg, Nebulization, BID, Tat, David, MD, 0.5 mg at 05/26/18 0932 .  chlorhexidine (PERIDEX) 0.12 % solution 15 mL, 15 mL, Mouth Rinse, BID, Tat, David, MD, 15 mL at 05/26/18 1000 .  diltiazem (CARDIZEM CD) 24 hr capsule 240 mg, 240 mg, Oral, Daily, Emokpae, Ejiroghene E, MD, 240 mg at 05/25/18 0908 .  guaiFENesin (MUCINEX) 12 hr tablet 600 mg, 600 mg, Oral, BID, Dhungel, Nishant, MD, 600 mg at 05/25/18 0908 .  guaiFENesin-dextromethorphan (ROBITUSSIN DM) 100-10 MG/5ML syrup 5 mL, 5 mL, Oral, Q4H PRN, Dhungel, Nishant, MD .  haloperidol lactate (HALDOL) injection 1 mg, 1 mg, Intravenous, Q6H PRN, Dhungel, Nishant, MD, 1 mg at 05/25/18 1502 .  ipratropium-albuterol (DUONEB) 0.5-2.5 (3) MG/3ML nebulizer solution 3 mL, 3 mL, Nebulization, Q4H, Dhungel,  Nishant, MD, 3 mL at 05/26/18 1322 .  levofloxacin (LEVAQUIN) IVPB 500 mg, 500 mg, Intravenous, Q48H, Dhungel, Nishant, MD, Last Rate: 100 mL/hr at 05/25/18 1451, 500 mg at 05/25/18 1451 .  levothyroxine (SYNTHROID, LEVOTHROID) tablet 88 mcg, 88 mcg, Oral, Q0600, Emokpae, Ejiroghene E, MD, 88 mcg at 05/25/18 0555 .  LORazepam (ATIVAN) injection 0.5 mg, 0.5 mg, Intravenous, Q4H PRN, Sinda Du, MD, 0.5 mg at 05/25/18 1004 .  MEDLINE mouth rinse, 15 mL, Mouth Rinse, q12n4p, Tat, David, MD, 15 mL at 05/26/18 1200 .  methylPREDNISolone sodium succinate (SOLU-MEDROL) 125 mg/2 mL  injection 60 mg, 60 mg, Intravenous, Q8H, Dhungel, Nishant, MD, 60 mg at 05/26/18 1336 .  metroNIDAZOLE (FLAGYL) IVPB 500 mg, 500 mg, Intravenous, Q8H, Dhungel, Nishant, MD, Last Rate: 100 mL/hr at 05/26/18 1340, 500 mg at 05/26/18 1340 .  ondansetron (ZOFRAN) tablet 4 mg, 4 mg, Oral, Q6H PRN **OR** ondansetron (ZOFRAN) injection 4 mg, 4 mg, Intravenous, Q6H PRN, Emokpae, Ejiroghene E, MD .  Warfarin - Pharmacist Dosing Inpatient, , Does not apply, q1800, Coffee, Donna Christen, [facility]    Allergies  Allergen Reactions  . Penicillins Rash   Procedures/Studies: Dg Chest 2 View  Result Date: 05/21/2018 CLINICAL DATA:  Cough EXAM: CHEST - 2 VIEW COMPARISON:  None. FINDINGS: Cardiomegaly. There is marked elevation of the left hemidiaphragm with a dense, heterogeneous opacity of the left lung base. Chronic fracture deformities of the right ribs and age-indeterminate vertebral body endplate deformities of the lower thoracic and upper lumbar spine. IMPRESSION: There is marked elevation of the left hemidiaphragm with a dense, heterogeneous opacity of the left lung base, concerning for infection or aspiration. Recommend follow-up radiographs in 6-8 weeks to ensure complete resolution and exclude underlying malignancy. Electronically Signed   By: Eddie Candle M.D.   On: 05/21/2018 10:01   US Renal  Result Date: 05/23/2018 CLINICAL DATA:  Acute on chronic renal failure. EXAM: RENAL / URINARY TRACT ULTRASOUND COMPLETE COMPARISON:  CT scan of November 09, 2006. FINDINGS: Right Kidney: Renal measurements: 10.2 x 5.7 x 5.0 cm = volume: 151 mL. Mildly increased echogenicity of renal parenchyma is noted. No mass or hydronephrosis visualized. Left Kidney: Renal measurements: 11.4 x 5.6 x 5.3 cm = volume: 177 mL. Mildly increased echogenicity of renal parenchyma is noted. No hydronephrosis visualized. 5.6 cm cyst with single thick septa is noted concerning for Bosniak type 2 lesion. Bladder: Appears normal for degree of bladder  distention. No ureteral jets are noted. IMPRESSION: Increased echogenicity of renal parenchyma is noted bilaterally consistent with medical renal disease. 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 Follow-up ultrasound in 1 year is recommended to ensure stability. Electronically Signed   By: Marijo Conception, M.D.   On: 05/23/2018 12:58   Dg Chest Port 1 View  Result Date: 05/26/2018 CLINICAL DATA:  Pneumonia. EXAM: PORTABLE CHEST 1 VIEW COMPARISON:  05/25/2018 FINDINGS: The cardiac silhouette, mediastinal and hilar contours are stable. There is mild tortuosity and calcification of the thoracic aorta. Persistent marked eventration of the left hemidiaphragm. Persistent dense left upper lobe airspace process, likely pneumonia. Patchy right upper lobe density could reflect a new or developing infiltrate. The right lung is otherwise clear. IMPRESSION: Persistent dense airspace process in the left lung, likely in a. Suspect new patchy right upper lobe infiltrate. Electronically Signed   By: Marijo Sanes M.D.   On: 05/26/2018 09:04   Dg Chest Port 1 View  Result Date: 05/25/2018 CLINICAL DATA:  Pneumonia, shortness of breath EXAM: PORTABLE CHEST 1 VIEW COMPARISON:  05/24/2018 FINDINGS: Stable AP portable examination with very dense consolidation of the left lung and volume loss with elevation of the left hemidiaphragm. No new airspace opacity. The right lung is normally aerated. The cardiomediastinal silhouette is largely obscured. IMPRESSION: Stable AP portable examination with very dense consolidation of the left lung and volume loss with elevation of the left hemidiaphragm. No new airspace opacity. The right lung is normally aerated. The cardiomediastinal silhouette is largely obscured. Electronically Signed   By: Eddie Candle M.D.   On: 05/25/2018 13:49   Dg Chest Port 1 View  Result Date: 05/24/2018 CLINICAL DATA:  Follow-up pneumonia EXAM: PORTABLE CHEST 1 VIEW COMPARISON:   05/22/2018 FINDINGS: Check shadow is stable. Persistent elevation of the left hemidiaphragm is noted with left basilar infiltrate. The overall appearance is stable. Old rib fractures are noted on the right. No focal infiltrate on the right is seen. IMPRESSION: Stable left-sided infiltrate. Electronically Signed   By: Inez Catalina M.D.   On: 05/24/2018 10:50   Dg Chest Portable 1 View  Result Date: 05/22/2018 CLINICAL DATA:  POSSIBLE SEPSIS. SEEN BY [facility] YESTERDAY AND DIAGNOSED WITH PNEUMONIA AND GIVEN Z-PAK. SOB MUCH WORSE TODAY.HX: NON-SMOKER, A-FIB,PNEUMONIA. EXAM: PORTABLE CHEST 1 VIEW COMPARISON:  05/21/2018 FINDINGS: Shallow lung inflation. Patient is rotated favoring the LEFT side. There is increased opacity of LEFT LOWER lobe and probable LEFT UPPER lobe infiltrate, associated with elevation of LEFT hemidiaphragm which appear stable. Numerous remote RIGHT rib fractures are identified. The RIGHT lung is otherwise clear. There is atherosclerotic calcification of the thoracic aorta. IMPRESSION: Increased density and size of LEFT pulmonary infiltrate. Suspect involvement of the LEFT UPPER lobe as well as the LEFT LOWER lobe. Stable elevation of the LEFT hemidiaphragm. Electronically Signed   By: Nolon Nations M.D.   On: 05/22/2018 16:39      Subjective: PT REMAINS ON BIPAP   Discharge Exam: Vitals:   05/26/18 1400 05/26/18 1500  BP: 138/65   Pulse: 96 98  Resp: (!) 22 (!) 24  Temp:    SpO2: 90% 98%   Vitals:   05/26/18 1300 05/26/18 1323 05/26/18 1400 05/26/18 1500  BP: 122/64  138/65   Pulse: 75  96 98  Resp: (!) 22  (!) 22 (!) 24  Temp:      TempSrc:      SpO2: 100% 99% 90% 98%  Weight:      Height:       Chronically ill appearing male in moderate resp distress.  HEENT: NCAT, supple neck, no thyromegaly.  Chest: Diffuse wheezing, rales and coarse breath sounds bilaterally CVS: S1-S2 regular, no murmurs GI: Soft, nondistended, nontender Musculoskeletal: Warm,  no edema CNS: Hard of hearing, No focal deficits.     The results of significant diagnostics from this hospitalization (including imaging, microbiology, ancillary and laboratory) are listed below for reference.     Microbiology: Recent Results (from the past 240 hour(s))  Culture, blood (Routine x 2)     Status: None   Collection Time: 05/22/18  3:13 PM  Result Value Ref Range Status   Specimen Description BLOOD LEFT FOREARM  Final   Special Requests   Final    BOTTLES DRAWN AEROBIC AND ANAEROBIC Blood Culture adequate volume   Culture   Final    NO GROWTH 84 DAYS Performed at [facility], [address] [place], [place] [address]    Report Status 05/23/2018 FINAL  Final  Culture, blood (Routine x 2)     Status: None (Preliminary result)   Collection Time: 05/22/18  3:15 PM  Result Value Ref Range Status   Specimen Description BLOOD RIGHT WRIST  Final   Special Requests   Final    BOTTLES DRAWN AEROBIC AND ANAEROBIC Blood Culture adequate volume   Culture   Final    NO GROWTH 4 DAYS Performed at [facility], [address] [place], [place] [address]    Report Status PENDING  Incomplete  Urine culture     Status: None   Collection Time: 05/22/18 10:56 PM  Result Value Ref Range Status   Specimen Description   Final    URINE, RANDOM Performed at [facility], [address], [place], [place] [address]    Special Requests   Final    NONE Performed at [facility], [address], [place], [place] [address]    Culture   Final    NO GROWTH Performed at [facility], [place] [address], [place], [place] [address]    Report Status 05/24/2018 FINAL  Final  MRSA PCR Screening     Status: None   Collection Time: 05/23/18 12:35 AM  Result Value Ref Range Status   MRSA by PCR NEGATIVE NEGATIVE Final    Comment:        The GeneXpert MRSA Assay (FDA approved for NASAL specimens only), is one component of a comprehensive MRSA colonization surveillance program. It is  not intended to diagnose MRSA infection nor to guide or monitor treatment for MRSA infections. Performed at [facility], [address] [place], [place] [address]   Respiratory Panel by PCR     Status: None   Collection Time: 05/23/18 10:12 AM  Result Value Ref Range Status   Adenovirus NOT DETECTED NOT DETECTED Final   Coronavirus 229E NOT DETECTED NOT DETECTED Final    Comment: (NOTE) The Coronavirus on the Respiratory Panel, DOES NOT test for the novel  Coronavirus (2019 nCoV)    Coronavirus HKU1 NOT DETECTED NOT DETECTED Final   Coronavirus NL63 NOT DETECTED NOT DETECTED Final   Coronavirus OC43 NOT DETECTED NOT DETECTED Final   Metapneumovirus NOT DETECTED NOT DETECTED Final   Rhinovirus / Enterovirus NOT DETECTED NOT DETECTED Final   Influenza A NOT DETECTED NOT DETECTED Final   Influenza B NOT DETECTED NOT DETECTED Final   Parainfluenza Virus 1 NOT DETECTED NOT DETECTED Final   Parainfluenza Virus 2 NOT DETECTED NOT DETECTED Final   Parainfluenza Virus 3 NOT DETECTED NOT DETECTED Final   Parainfluenza Virus 4 NOT DETECTED NOT DETECTED Final   Respiratory Syncytial Virus NOT DETECTED NOT DETECTED Final   Bordetella pertussis NOT DETECTED NOT DETECTED Final   Chlamydophila pneumoniae NOT DETECTED NOT DETECTED Final   Mycoplasma pneumoniae NOT DETECTED NOT DETECTED Final    Comment: Performed at [facility], [place] [address] [place], [place] [address]     Labs: BNP (last 3 results) No results for input(s): BNP in the last 8760 hours. Basic Metabolic Panel: Recent Labs  Lab 05/22/18 1513 05/23/18 0443 05/24/18 0426 05/25/18 0431 05/26/18 0513 05/26/18 0532  NA 135 140 143 142 146*  --   K 3.2* 3.5 3.0* 3.7 3.2*  --   CL 100 110 113* 110 117*  --   CO2 20* 18* 18* 20* 19*  --   GLUCOSE 140* 107* 196* 320* 271*  --   BUN 99* 100* 104* 106* 107*  --  CREATININE 4.58* 4.25* 3.88* 3.37* 3.07*  --   CALCIUM 8.5* 8.0* 8.9 9.2 9.2  --   MG 2.0  --  2.3  --    --  2.2   Liver Function Tests: Recent Labs  Lab 05/22/18 1513 05/26/18 0513  AST 37 20  ALT 27 21  ALKPHOS 54 50  BILITOT 0.8 0.7  PROT 7.0 5.5*  ALBUMIN 3.2* 2.3*   No results for input(s): LIPASE, AMYLASE in the last 168 hours. No results for input(s): AMMONIA in the last 168 hours. CBC: Recent Labs  Lab 05/22/18 1513 05/23/18 0443 05/24/18 0426 05/25/18 0431 05/26/18 0513  WBC 8.4 5.9 3.3* 6.2 5.4  NEUTROABS 8.0*  --   --   --   --   HGB 13.7 11.5* 11.2* 11.8* 11.4*  HCT 37.7* 30.9* 30.4* 32.2* 30.7*  MCV 87.1 85.8 87.1 86.1 86.0  PLT 133* 114* 136* 173 174   Cardiac Enzymes: No results for input(s): CKTOTAL, CKMB, CKMBINDEX, TROPONINI in the last 168 hours. BNP: Invalid input(s): POCBNP CBG: Recent Labs  Lab 05/23/18 0805 05/23/18 1137 05/23/18 1619  GLUCAP 120* 162* 147*   D-Dimer No results for input(s): DDIMER in the last 72 hours. Hgb A1c No results for input(s): HGBA1C in the last 72 hours. Lipid Profile No results for input(s): CHOL, HDL, LDLCALC, TRIG, CHOLHDL, LDLDIRECT in the last 72 hours. Thyroid function studies No results for input(s): TSH, T4TOTAL, T3FREE, THYROIDAB in the last 72 hours.  Invalid input(s): FREET3 Anemia work up No results for input(s): VITAMINB12, FOLATE, FERRITIN, TIBC, IRON, RETICCTPCT in the last 72 hours. Urinalysis    Component Value Date/Time   COLORURINE YELLOW 05/22/2018 1456   APPEARANCEUR HAZY (A) 05/22/2018 1456   LABSPEC 1.012 05/22/2018 1456   PHURINE 5.0 05/22/2018 1456   GLUCOSEU NEGATIVE 05/22/2018 1456   HGBUR MODERATE (A) 05/22/2018 1456   BILIRUBINUR NEGATIVE 05/22/2018 1456   KETONESUR NEGATIVE 05/22/2018 1456   PROTEINUR 30 (A) 05/22/2018 1456   NITRITE NEGATIVE 05/22/2018 1456   LEUKOCYTESUR NEGATIVE 05/22/2018 1456   Sepsis Labs Invalid input(s): PROCALCITONIN,  WBC,  LACTICIDVEN Microbiology Recent Results (from the past 240 hour(s))  Culture, blood (Routine x 2)     Status: None    Collection Time: 05/22/18  3:13 PM  Result Value Ref Range Status   Specimen Description BLOOD LEFT FOREARM  Final   Special Requests   Final    BOTTLES DRAWN AEROBIC AND ANAEROBIC Blood Culture adequate volume   Culture   Final    NO GROWTH 84 DAYS Performed at [facility], [address] [place], [place] [address]    Report Status 05/23/2018 FINAL  Final  Culture, blood (Routine x 2)     Status: None (Preliminary result)   Collection Time: 05/22/18  3:15 PM  Result Value Ref Range Status   Specimen Description BLOOD RIGHT WRIST  Final   Special Requests   Final    BOTTLES DRAWN AEROBIC AND ANAEROBIC Blood Culture adequate volume   Culture   Final    NO GROWTH 4 DAYS Performed at [facility], [address], [place], [place] [address]    Report Status PENDING  Incomplete  Urine culture     Status: None   Collection Time: 05/22/18 10:56 PM  Result Value Ref Range Status   Specimen Description   Final    URINE, RANDOM Performed at [facility], [address], [place], [place] [address]    Special Requests   Final  NONE Performed at [facility], [address], [place], [place] [address]    Culture   Final    NO GROWTH Performed at [facility], [place] [address] [place], [place] [address]    Report Status 05/24/2018 FINAL  Final  MRSA PCR Screening     Status: None   Collection Time: 05/23/18 12:35 AM  Result Value Ref Range Status   MRSA by PCR NEGATIVE NEGATIVE Final    Comment:        The GeneXpert MRSA Assay (FDA approved for NASAL specimens only), is one component of a comprehensive MRSA colonization surveillance program. It is not intended to diagnose MRSA infection nor to guide or monitor treatment for MRSA infections. Performed at [facility], [address] [place], [place] [address]   Respiratory Panel by PCR     Status: None   Collection Time: 05/23/18 10:12 AM  Result Value Ref Range Status   Adenovirus NOT DETECTED NOT DETECTED Final    Coronavirus 229E NOT DETECTED NOT DETECTED Final    Comment: (NOTE) The Coronavirus on the Respiratory Panel, DOES NOT test for the novel  Coronavirus (2019 nCoV)    Coronavirus HKU1 NOT DETECTED NOT DETECTED Final   Coronavirus NL63 NOT DETECTED NOT DETECTED Final   Coronavirus OC43 NOT DETECTED NOT DETECTED Final   Metapneumovirus NOT DETECTED NOT DETECTED Final   Rhinovirus / Enterovirus NOT DETECTED NOT DETECTED Final   Influenza A NOT DETECTED NOT DETECTED Final   Influenza B NOT DETECTED NOT DETECTED Final   Parainfluenza Virus 1 NOT DETECTED NOT DETECTED Final   Parainfluenza Virus 2 NOT DETECTED NOT DETECTED Final   Parainfluenza Virus 3 NOT DETECTED NOT DETECTED Final   Parainfluenza Virus 4 NOT DETECTED NOT DETECTED Final   Respiratory Syncytial Virus NOT DETECTED NOT DETECTED Final   Bordetella pertussis NOT DETECTED NOT DETECTED Final   Chlamydophila pneumoniae NOT DETECTED NOT DETECTED Final   Mycoplasma pneumoniae NOT DETECTED NOT DETECTED Final    Comment: Performed at [facility], [place] [address] [place], [place] [address]   Time coordinating discharge:   SIGNED:  Irwin Brakeman, MD  [facility] 05/26/2018, 3:16 PM How to contact the [facility] Attending or Consulting provider [place] or covering provider during after hours [place], for this patient?  1. Check the care team in [facility] and look for a) attending/consulting [facility] provider listed and b) the [facility] team listed 2. Log into [email or web address] and use [facility] universal password to access. If you do not have the password, please contact the hospital operator. 3. Locate the [facility] provider you are looking for under [facility] and page to a number that you can be directly reached. 4. If you still have difficulty reaching the provider, please page the [facility] (Director on Call) for the Hospitalists listed on amion for assistance.

## 2018-05-26 NOTE — Progress Notes (Signed)
CRITICAL VALUE ALERT  Critical Value:  INR 8.14 Date & Time Notied:  2/8  0650  Provider Notified: [place] Orders Received/Actions taken: no orders at this time

## 2018-05-26 NOTE — Progress Notes (Signed)
Patient being transported to [facility] critical transport came for transport. Called RN at [facility] and gave report. Paperwork given to EMS transport.

## 2018-05-26 NOTE — Progress Notes (Signed)
He remains on BiPAP.  Chest x-ray looks worse to me today.  Blood gas is pending.  Nursing staff say that patient's daughter-in-law at bedside is requesting transfer to higher level of care.  I think he will need to be intubated and placed on mechanical ventilation before a transfer could take place.

## 2018-05-27 LAB — CULTURE, BLOOD (ROUTINE X 2)
Culture: NO GROWTH
Special Requests: ADEQUATE

## 2018-05-29 LAB — LEGIONELLA PNEUMOPHILA SEROGP 1 UR AG: L. pneumophila Serogp 1 Ur Ag: POSITIVE — AB

## 2018-05-29 MED ORDER — AMIODARONE HCL 200 MG PO TABS
200.00 | ORAL_TABLET | ORAL | Status: DC
Start: 2018-05-31 — End: 2018-05-29

## 2018-05-29 MED ORDER — DILTIAZEM HCL 60 MG PO TABS
60.00 | ORAL_TABLET | ORAL | Status: DC
Start: 2018-05-30 — End: 2018-05-29

## 2018-05-29 MED ORDER — ACETAMINOPHEN 325 MG PO TABS
650.00 | ORAL_TABLET | ORAL | Status: DC
Start: ? — End: 2018-05-29

## 2018-05-29 MED ORDER — FAMOTIDINE 20 MG/2ML IV SOLN
20.00 | INTRAVENOUS | Status: DC
Start: 2018-05-31 — End: 2018-05-29

## 2018-05-29 MED ORDER — INSULIN LISPRO 100 UNIT/ML ~~LOC~~ SOLN
1.00 | SUBCUTANEOUS | Status: DC
Start: 2018-05-30 — End: 2018-05-29

## 2018-05-29 MED ORDER — INSULIN GLARGINE 100 UNIT/ML ~~LOC~~ SOLN
1.00 | SUBCUTANEOUS | Status: DC
Start: ? — End: 2018-05-29

## 2018-05-29 MED ORDER — NITROGLYCERIN 0.4 MG SL SUBL
0.40 | SUBLINGUAL_TABLET | SUBLINGUAL | Status: DC
Start: ? — End: 2018-05-29

## 2018-05-29 MED ORDER — GENERIC EXTERNAL MEDICATION
10.00 | Status: DC
Start: ? — End: 2018-05-29

## 2018-05-29 MED ORDER — GENERIC EXTERNAL MEDICATION
1.00 | Status: DC
Start: ? — End: 2018-05-29

## 2018-05-29 MED ORDER — HYDRALAZINE HCL 20 MG/ML IJ SOLN
10.00 | INTRAMUSCULAR | Status: DC
Start: ? — End: 2018-05-29

## 2018-05-29 MED ORDER — INSULIN GLARGINE 100 UNIT/ML ~~LOC~~ SOLN
1.00 | SUBCUTANEOUS | Status: DC
Start: 2018-05-30 — End: 2018-05-29

## 2018-05-29 MED ORDER — DEXTROSE 5 % IV SOLN
75.00 | INTRAVENOUS | Status: DC
Start: ? — End: 2018-05-29

## 2018-05-29 MED ORDER — GENERIC EXTERNAL MEDICATION
650.00 | Status: DC
Start: ? — End: 2018-05-29

## 2018-05-29 MED ORDER — LEVOFLOXACIN IN D5W 750 MG/150ML IV SOLN
750.00 | INTRAVENOUS | Status: DC
Start: 2018-05-30 — End: 2018-05-29

## 2018-05-29 MED ORDER — LEVOTHYROXINE SODIUM 88 MCG PO TABS
88.00 | ORAL_TABLET | ORAL | Status: DC
Start: 2018-05-31 — End: 2018-05-29

## 2018-05-29 MED ORDER — MUPIROCIN 2 % EX OINT
TOPICAL_OINTMENT | CUTANEOUS | Status: DC
Start: 2018-05-30 — End: 2018-05-29

## 2018-05-29 MED ORDER — POLYETHYLENE GLYCOL 3350 17 G PO PACK
17.00 | PACK | ORAL | Status: DC
Start: ? — End: 2018-05-29

## 2018-05-29 MED ORDER — INSULIN LISPRO 100 UNIT/ML ~~LOC~~ SOLN
1.00 | SUBCUTANEOUS | Status: DC
Start: ? — End: 2018-05-29

## 2018-05-29 MED ORDER — SODIUM CHLORIDE 0.9 % IV SOLN
10.00 | INTRAVENOUS | Status: DC
Start: ? — End: 2018-05-29

## 2018-05-29 MED ORDER — METHYLPREDNISOLONE SODIUM SUCC 40 MG IJ SOLR
40.00 | INTRAMUSCULAR | Status: DC
Start: 2018-05-30 — End: 2018-05-29

## 2018-05-29 MED ORDER — METRONIDAZOLE IN NACL 5-0.79 MG/ML-% IV SOLN
500.00 | INTRAVENOUS | Status: DC
Start: 2018-05-30 — End: 2018-05-29

## 2018-05-30 MED ORDER — WARFARIN SODIUM 1 MG PO TABS
1.00 | ORAL_TABLET | ORAL | Status: DC
Start: 2018-05-30 — End: 2018-05-30

## 2018-05-30 MED ORDER — GENERIC EXTERNAL MEDICATION
30.00 | Status: DC
Start: 2018-05-30 — End: 2018-05-30

## 2018-05-30 MED ORDER — WARFARIN SODIUM 1 MG PO TABS
ORAL_TABLET | ORAL | Status: DC
Start: ? — End: 2018-05-30

## 2018-06-04 DIAGNOSIS — Z8673 Personal history of transient ischemic attack (TIA), and cerebral infarction without residual deficits: Secondary | ICD-10-CM | POA: Diagnosis not present

## 2018-06-04 DIAGNOSIS — G459 Transient cerebral ischemic attack, unspecified: Secondary | ICD-10-CM | POA: Diagnosis not present

## 2018-06-04 DIAGNOSIS — I10 Essential (primary) hypertension: Secondary | ICD-10-CM | POA: Diagnosis not present

## 2018-06-04 DIAGNOSIS — Z5181 Encounter for therapeutic drug level monitoring: Secondary | ICD-10-CM | POA: Diagnosis not present

## 2018-06-04 DIAGNOSIS — R1310 Dysphagia, unspecified: Secondary | ICD-10-CM | POA: Diagnosis not present

## 2018-06-04 DIAGNOSIS — N179 Acute kidney failure, unspecified: Secondary | ICD-10-CM | POA: Diagnosis not present

## 2018-06-04 DIAGNOSIS — J690 Pneumonitis due to inhalation of food and vomit: Secondary | ICD-10-CM | POA: Diagnosis not present

## 2018-06-04 DIAGNOSIS — Z7901 Long term (current) use of anticoagulants: Secondary | ICD-10-CM | POA: Diagnosis not present

## 2018-06-04 DIAGNOSIS — A481 Legionnaires' disease: Secondary | ICD-10-CM | POA: Diagnosis not present

## 2018-06-04 DIAGNOSIS — I4892 Unspecified atrial flutter: Secondary | ICD-10-CM | POA: Diagnosis not present

## 2018-06-07 ENCOUNTER — Telehealth: Payer: Self-pay | Admitting: *Deleted

## 2018-06-07 NOTE — Telephone Encounter (Signed)
VM from [place] w/ [facility] Pt was scheduled for PT/INR tomorrow, is not able to get to office Is wanting to know if order can be given for [facility] to draw, and family is wanting to know if labs for renal function can be done as well Please advise

## 2018-06-08 ENCOUNTER — Ambulatory Visit: Payer: Medicare Other | Admitting: Family Medicine

## 2018-06-08 ENCOUNTER — Telehealth: Payer: Self-pay

## 2018-06-08 DIAGNOSIS — J3489 Other specified disorders of nose and nasal sinuses: Secondary | ICD-10-CM | POA: Diagnosis not present

## 2018-06-08 DIAGNOSIS — E43 Unspecified severe protein-calorie malnutrition: Secondary | ICD-10-CM | POA: Diagnosis not present

## 2018-06-08 DIAGNOSIS — E870 Hyperosmolality and hypernatremia: Secondary | ICD-10-CM | POA: Diagnosis not present

## 2018-06-08 DIAGNOSIS — D72819 Decreased white blood cell count, unspecified: Secondary | ICD-10-CM | POA: Diagnosis not present

## 2018-06-08 DIAGNOSIS — N3289 Other specified disorders of bladder: Secondary | ICD-10-CM | POA: Diagnosis not present

## 2018-06-08 DIAGNOSIS — D696 Thrombocytopenia, unspecified: Secondary | ICD-10-CM | POA: Diagnosis not present

## 2018-06-08 DIAGNOSIS — J189 Pneumonia, unspecified organism: Secondary | ICD-10-CM | POA: Diagnosis not present

## 2018-06-08 DIAGNOSIS — D61818 Other pancytopenia: Secondary | ICD-10-CM | POA: Diagnosis not present

## 2018-06-08 DIAGNOSIS — Z7901 Long term (current) use of anticoagulants: Secondary | ICD-10-CM | POA: Diagnosis not present

## 2018-06-08 DIAGNOSIS — B370 Candidal stomatitis: Secondary | ICD-10-CM | POA: Diagnosis not present

## 2018-06-08 DIAGNOSIS — K1230 Oral mucositis (ulcerative), unspecified: Secondary | ICD-10-CM | POA: Diagnosis not present

## 2018-06-08 DIAGNOSIS — R1310 Dysphagia, unspecified: Secondary | ICD-10-CM | POA: Diagnosis not present

## 2018-06-08 DIAGNOSIS — I4892 Unspecified atrial flutter: Secondary | ICD-10-CM | POA: Diagnosis not present

## 2018-06-08 DIAGNOSIS — J9601 Acute respiratory failure with hypoxia: Secondary | ICD-10-CM | POA: Diagnosis not present

## 2018-06-08 DIAGNOSIS — J342 Deviated nasal septum: Secondary | ICD-10-CM | POA: Diagnosis not present

## 2018-06-08 DIAGNOSIS — R0602 Shortness of breath: Secondary | ICD-10-CM | POA: Diagnosis not present

## 2018-06-08 DIAGNOSIS — I82611 Acute embolism and thrombosis of superficial veins of right upper extremity: Secondary | ICD-10-CM | POA: Diagnosis not present

## 2018-06-08 DIAGNOSIS — J690 Pneumonitis due to inhalation of food and vomit: Secondary | ICD-10-CM | POA: Diagnosis not present

## 2018-06-08 DIAGNOSIS — N184 Chronic kidney disease, stage 4 (severe): Secondary | ICD-10-CM | POA: Diagnosis not present

## 2018-06-08 DIAGNOSIS — D649 Anemia, unspecified: Secondary | ICD-10-CM | POA: Diagnosis not present

## 2018-06-08 DIAGNOSIS — D61811 Other drug-induced pancytopenia: Secondary | ICD-10-CM | POA: Diagnosis not present

## 2018-06-08 DIAGNOSIS — R339 Retention of urine, unspecified: Secondary | ICD-10-CM | POA: Diagnosis not present

## 2018-06-08 DIAGNOSIS — E039 Hypothyroidism, unspecified: Secondary | ICD-10-CM | POA: Diagnosis not present

## 2018-06-08 DIAGNOSIS — R791 Abnormal coagulation profile: Secondary | ICD-10-CM | POA: Insufficient documentation

## 2018-06-08 DIAGNOSIS — A481 Legionnaires' disease: Secondary | ICD-10-CM | POA: Diagnosis not present

## 2018-06-08 DIAGNOSIS — J90 Pleural effusion, not elsewhere classified: Secondary | ICD-10-CM | POA: Diagnosis not present

## 2018-06-08 DIAGNOSIS — I129 Hypertensive chronic kidney disease with stage 1 through stage 4 chronic kidney disease, or unspecified chronic kidney disease: Secondary | ICD-10-CM | POA: Diagnosis not present

## 2018-06-08 DIAGNOSIS — D631 Anemia in chronic kidney disease: Secondary | ICD-10-CM | POA: Diagnosis not present

## 2018-06-08 DIAGNOSIS — A419 Sepsis, unspecified organism: Secondary | ICD-10-CM | POA: Diagnosis not present

## 2018-06-08 DIAGNOSIS — K5900 Constipation, unspecified: Secondary | ICD-10-CM | POA: Diagnosis not present

## 2018-06-08 DIAGNOSIS — N179 Acute kidney failure, unspecified: Secondary | ICD-10-CM | POA: Diagnosis not present

## 2018-06-08 NOTE — Telephone Encounter (Signed)
Home health nurse did INR which was 6.2   She was unable to draw blood  Patient has +4 edema BILAT so family is taking to [facility] in [place] now

## 2018-06-08 NOTE — Telephone Encounter (Signed)
Please order both. Thanks, WS

## 2018-06-08 NOTE — Telephone Encounter (Signed)
Verbal order given to [facility] with [facility]

## 2018-06-10 DIAGNOSIS — D696 Thrombocytopenia, unspecified: Secondary | ICD-10-CM | POA: Insufficient documentation

## 2018-06-11 DIAGNOSIS — B370 Candidal stomatitis: Secondary | ICD-10-CM | POA: Insufficient documentation

## 2018-06-11 DIAGNOSIS — R531 Weakness: Secondary | ICD-10-CM | POA: Insufficient documentation

## 2018-06-13 ENCOUNTER — Ambulatory Visit (INDEPENDENT_AMBULATORY_CARE_PROVIDER_SITE_OTHER): Payer: Medicare Other

## 2018-06-13 DIAGNOSIS — I4892 Unspecified atrial flutter: Secondary | ICD-10-CM

## 2018-06-13 DIAGNOSIS — R1310 Dysphagia, unspecified: Secondary | ICD-10-CM | POA: Diagnosis not present

## 2018-06-13 DIAGNOSIS — A481 Legionnaires' disease: Secondary | ICD-10-CM

## 2018-06-13 DIAGNOSIS — J690 Pneumonitis due to inhalation of food and vomit: Secondary | ICD-10-CM | POA: Diagnosis not present

## 2018-06-13 DIAGNOSIS — N179 Acute kidney failure, unspecified: Secondary | ICD-10-CM

## 2018-06-13 MED ORDER — AMIODARONE HCL 200 MG PO TABS
200.00 | ORAL_TABLET | ORAL | Status: DC
Start: 2018-06-23 — End: 2018-06-13

## 2018-06-13 MED ORDER — GENERIC EXTERNAL MEDICATION
0.50 | Status: DC
Start: 2018-06-13 — End: 2018-06-13

## 2018-06-13 MED ORDER — NITROGLYCERIN 0.4 MG SL SUBL
0.40 | SUBLINGUAL_TABLET | SUBLINGUAL | Status: DC
Start: ? — End: 2018-06-13

## 2018-06-13 MED ORDER — WARFARIN SODIUM 1 MG PO TABS
ORAL_TABLET | ORAL | Status: DC
Start: ? — End: 2018-06-13

## 2018-06-13 MED ORDER — FLUCONAZOLE 100 MG PO TABS
100.00 | ORAL_TABLET | ORAL | Status: DC
Start: 2018-06-14 — End: 2018-06-13

## 2018-06-13 MED ORDER — ALUM & MAG HYDROXIDE-SIMETH 200-200-20 MG/5ML PO SUSP
30.00 | ORAL | Status: DC
Start: ? — End: 2018-06-13

## 2018-06-13 MED ORDER — GENERIC EXTERNAL MEDICATION
12.50 | Status: DC
Start: ? — End: 2018-06-13

## 2018-06-13 MED ORDER — CALCIUM POLYCARBOPHIL 625 MG PO TABS
1250.00 | ORAL_TABLET | ORAL | Status: DC
Start: 2018-06-23 — End: 2018-06-13

## 2018-06-13 MED ORDER — SODIUM CHLORIDE 0.9 % IV SOLN
10.00 | INTRAVENOUS | Status: DC
Start: ? — End: 2018-06-13

## 2018-06-13 MED ORDER — GENERIC EXTERNAL MEDICATION
5.00 | Status: DC
Start: 2018-06-22 — End: 2018-06-13

## 2018-06-13 MED ORDER — ACETAMINOPHEN 325 MG PO TABS
650.00 | ORAL_TABLET | ORAL | Status: DC
Start: ? — End: 2018-06-13

## 2018-06-13 MED ORDER — SODIUM CHLORIDE 0.9 % IV SOLN
75.00 | INTRAVENOUS | Status: DC
Start: ? — End: 2018-06-13

## 2018-06-13 MED ORDER — GENERIC EXTERNAL MEDICATION
10.00 | Status: DC
Start: ? — End: 2018-06-13

## 2018-06-13 MED ORDER — LEVOTHYROXINE SODIUM 88 MCG PO TABS
88.00 | ORAL_TABLET | ORAL | Status: DC
Start: 2018-06-23 — End: 2018-06-13

## 2018-06-15 DIAGNOSIS — K1230 Oral mucositis (ulcerative), unspecified: Secondary | ICD-10-CM | POA: Insufficient documentation

## 2018-06-21 DIAGNOSIS — Z029 Encounter for administrative examinations, unspecified: Secondary | ICD-10-CM

## 2018-06-23 MED ORDER — VALACYCLOVIR HCL 500 MG PO TABS
1000.00 | ORAL_TABLET | ORAL | Status: DC
Start: 2018-06-22 — End: 2018-06-23

## 2018-06-23 MED ORDER — FLUTICASONE PROPIONATE 50 MCG/ACT NA SUSP
1.00 | NASAL | Status: DC
Start: 2018-06-23 — End: 2018-06-23

## 2018-06-23 MED ORDER — SODIUM CHLORIDE (PF) 0.9 % IJ SOLN
10.00 | INTRAMUSCULAR | Status: DC
Start: ? — End: 2018-06-23

## 2018-06-23 MED ORDER — FOLIC ACID 1 MG PO TABS
3.00 | ORAL_TABLET | ORAL | Status: DC
Start: 2018-06-23 — End: 2018-06-23

## 2018-06-23 MED ORDER — VITAMIN B-12 1000 MCG PO TABS
1000.00 | ORAL_TABLET | ORAL | Status: DC
Start: 2018-06-23 — End: 2018-06-23

## 2018-06-23 MED ORDER — WARFARIN SODIUM 2 MG PO TABS
2.00 | ORAL_TABLET | ORAL | Status: DC
Start: ? — End: 2018-06-23

## 2018-06-25 DIAGNOSIS — D649 Anemia, unspecified: Secondary | ICD-10-CM | POA: Diagnosis not present

## 2018-06-25 DIAGNOSIS — D701 Agranulocytosis secondary to cancer chemotherapy: Secondary | ICD-10-CM | POA: Diagnosis not present

## 2018-06-25 DIAGNOSIS — Z7901 Long term (current) use of anticoagulants: Secondary | ICD-10-CM | POA: Diagnosis not present

## 2018-06-25 DIAGNOSIS — D696 Thrombocytopenia, unspecified: Secondary | ICD-10-CM | POA: Diagnosis not present

## 2018-06-25 DIAGNOSIS — D709 Neutropenia, unspecified: Secondary | ICD-10-CM | POA: Diagnosis not present

## 2018-06-25 DIAGNOSIS — T451X5A Adverse effect of antineoplastic and immunosuppressive drugs, initial encounter: Secondary | ICD-10-CM | POA: Diagnosis not present

## 2018-06-26 DIAGNOSIS — Z8546 Personal history of malignant neoplasm of prostate: Secondary | ICD-10-CM | POA: Diagnosis not present

## 2018-06-26 DIAGNOSIS — R338 Other retention of urine: Secondary | ICD-10-CM | POA: Diagnosis not present

## 2018-06-27 ENCOUNTER — Encounter: Payer: Self-pay | Admitting: Pharmacist Clinician (PhC)/ Clinical Pharmacy Specialist

## 2018-06-28 ENCOUNTER — Telehealth: Payer: Self-pay | Admitting: *Deleted

## 2018-06-28 DIAGNOSIS — R338 Other retention of urine: Secondary | ICD-10-CM | POA: Diagnosis not present

## 2018-06-28 DIAGNOSIS — A481 Legionnaires' disease: Secondary | ICD-10-CM | POA: Diagnosis not present

## 2018-06-28 NOTE — Telephone Encounter (Signed)
VM from [place] w/ [facility] Pt's PT / INR results from today PT 26 INR 2.1

## 2018-06-28 NOTE — Telephone Encounter (Signed)
Aware to continue current dose, recheck in 1 mos as before

## 2018-06-28 NOTE — Telephone Encounter (Signed)
His goal INR in 2-3, so he is stable on current dosing.

## 2018-06-29 DIAGNOSIS — R338 Other retention of urine: Secondary | ICD-10-CM | POA: Diagnosis not present

## 2018-06-30 DIAGNOSIS — M25561 Pain in right knee: Secondary | ICD-10-CM | POA: Diagnosis not present

## 2018-07-02 DIAGNOSIS — D649 Anemia, unspecified: Secondary | ICD-10-CM | POA: Diagnosis not present

## 2018-07-02 DIAGNOSIS — I350 Nonrheumatic aortic (valve) stenosis: Secondary | ICD-10-CM | POA: Diagnosis not present

## 2018-07-02 DIAGNOSIS — I4891 Unspecified atrial fibrillation: Secondary | ICD-10-CM | POA: Diagnosis not present

## 2018-07-02 DIAGNOSIS — A481 Legionnaires' disease: Secondary | ICD-10-CM | POA: Diagnosis not present

## 2018-07-02 DIAGNOSIS — N189 Chronic kidney disease, unspecified: Secondary | ICD-10-CM | POA: Diagnosis not present

## 2018-07-02 DIAGNOSIS — G459 Transient cerebral ischemic attack, unspecified: Secondary | ICD-10-CM | POA: Diagnosis not present

## 2018-07-03 ENCOUNTER — Other Ambulatory Visit: Payer: Self-pay | Admitting: *Deleted

## 2018-07-03 ENCOUNTER — Telehealth: Payer: Self-pay | Admitting: *Deleted

## 2018-07-03 NOTE — Telephone Encounter (Signed)
Opened in error

## 2018-07-03 NOTE — Telephone Encounter (Signed)
TC from [place] w/ [facility] Calling w/ alert lab values Hgb 8.8, RBC 2.58, HCT 24.9

## 2018-07-05 ENCOUNTER — Telehealth: Payer: Self-pay | Admitting: *Deleted

## 2018-07-05 DIAGNOSIS — N183 Chronic kidney disease, stage 3 (moderate): Secondary | ICD-10-CM | POA: Diagnosis not present

## 2018-07-05 DIAGNOSIS — N179 Acute kidney failure, unspecified: Secondary | ICD-10-CM | POA: Diagnosis not present

## 2018-07-05 DIAGNOSIS — A481 Legionnaires' disease: Secondary | ICD-10-CM | POA: Diagnosis not present

## 2018-07-05 DIAGNOSIS — Z5181 Encounter for therapeutic drug level monitoring: Secondary | ICD-10-CM | POA: Diagnosis not present

## 2018-07-05 DIAGNOSIS — I10 Essential (primary) hypertension: Secondary | ICD-10-CM | POA: Diagnosis not present

## 2018-07-05 DIAGNOSIS — Z8673 Personal history of transient ischemic attack (TIA), and cerebral infarction without residual deficits: Secondary | ICD-10-CM | POA: Diagnosis not present

## 2018-07-05 DIAGNOSIS — Z7901 Long term (current) use of anticoagulants: Secondary | ICD-10-CM | POA: Diagnosis not present

## 2018-07-05 DIAGNOSIS — J690 Pneumonitis due to inhalation of food and vomit: Secondary | ICD-10-CM | POA: Diagnosis not present

## 2018-07-05 DIAGNOSIS — D649 Anemia, unspecified: Secondary | ICD-10-CM | POA: Diagnosis not present

## 2018-07-05 DIAGNOSIS — R1310 Dysphagia, unspecified: Secondary | ICD-10-CM | POA: Diagnosis not present

## 2018-07-05 DIAGNOSIS — I4892 Unspecified atrial flutter: Secondary | ICD-10-CM | POA: Diagnosis not present

## 2018-07-05 NOTE — Telephone Encounter (Signed)
Notified kathy from [facility] patient should alternate 2 mg and 1mg  every other day and recheck in 1 week per Dr. Livia Snellen.

## 2018-07-05 NOTE — Telephone Encounter (Signed)
Got to 2 mg today and 1 mg tomorrow. Alternate qod.

## 2018-07-05 NOTE — Telephone Encounter (Signed)
TC from [place] w/ [facility] INR 1.4 PT 17.2 today     Coumadin 1 mg daily

## 2018-07-10 DIAGNOSIS — D701 Agranulocytosis secondary to cancer chemotherapy: Secondary | ICD-10-CM | POA: Diagnosis not present

## 2018-07-10 DIAGNOSIS — D709 Neutropenia, unspecified: Secondary | ICD-10-CM | POA: Diagnosis not present

## 2018-07-10 DIAGNOSIS — D696 Thrombocytopenia, unspecified: Secondary | ICD-10-CM | POA: Diagnosis not present

## 2018-07-10 DIAGNOSIS — T451X5A Adverse effect of antineoplastic and immunosuppressive drugs, initial encounter: Secondary | ICD-10-CM | POA: Diagnosis not present

## 2018-07-10 DIAGNOSIS — D649 Anemia, unspecified: Secondary | ICD-10-CM | POA: Diagnosis not present

## 2018-07-10 DIAGNOSIS — Z7901 Long term (current) use of anticoagulants: Secondary | ICD-10-CM | POA: Diagnosis not present

## 2018-07-12 ENCOUNTER — Telehealth: Payer: Self-pay | Admitting: *Deleted

## 2018-07-12 NOTE — Telephone Encounter (Signed)
Larry Gates w/ [facility] aware to continue current dosing & repeat in 1 week

## 2018-07-12 NOTE — Telephone Encounter (Signed)
TC from [place] w/ [facility] INR 1.9 PT 23.1 Coumadin alternating 2 mg w/ 1 mg Last night's dose 2 mg

## 2018-07-12 NOTE — Telephone Encounter (Signed)
Can continue current dose, alternating 2 mg and 1 mg. His goal is 2.0 - 3.0. recheck INR in one week.

## 2018-07-18 ENCOUNTER — Ambulatory Visit: Payer: Self-pay | Admitting: Pharmacist Clinician (PhC)/ Clinical Pharmacy Specialist

## 2018-08-01 DIAGNOSIS — I5032 Chronic diastolic (congestive) heart failure: Secondary | ICD-10-CM | POA: Diagnosis not present

## 2018-08-01 DIAGNOSIS — A481 Legionnaires' disease: Secondary | ICD-10-CM | POA: Diagnosis not present

## 2018-08-02 DIAGNOSIS — G459 Transient cerebral ischemic attack, unspecified: Secondary | ICD-10-CM | POA: Diagnosis not present

## 2018-08-07 ENCOUNTER — Telehealth: Payer: Self-pay | Admitting: *Deleted

## 2018-08-07 NOTE — Telephone Encounter (Signed)
LMOVM pt is needing an appt w/ [facility] / PCP this week for repeat INR, home health services are no longer going out to the home as of last week

## 2018-08-07 NOTE — Telephone Encounter (Signed)
This RN called to speak with pt RE: his upcoming scheduled appt on 4/29.  Pt could not hear on the telephone and asked that I speak with his son, Octavia Bruckner.  Advised Tim we are not seeing patient in the office at this time d/t COVID-19 and offered a phone appt.  Per Tim, since pt can not hear well he feels it would be better for pt to wait to f/u when we are seeing pt's in the office.  Advised that will be August at this point.  Tim states understanding.  He reports pt is doing well now.  He had been in the hospital with pneumonia.  He has a HHN who has been coming out once a week and pt is not having any cardiac issues at this time.  Advised I will place pt on the recall list but for them to contact us if pt develops any concerns or complaints.  Tim thanked me for the call and information.

## 2018-08-08 ENCOUNTER — Other Ambulatory Visit: Payer: Self-pay

## 2018-08-08 ENCOUNTER — Ambulatory Visit (INDEPENDENT_AMBULATORY_CARE_PROVIDER_SITE_OTHER): Payer: Medicare Other | Admitting: Pharmacist Clinician (PhC)/ Clinical Pharmacy Specialist

## 2018-08-08 DIAGNOSIS — I482 Chronic atrial fibrillation, unspecified: Secondary | ICD-10-CM

## 2018-08-08 LAB — COAGUCHEK XS/INR WAIVED
INR: 1.8 — ABNORMAL HIGH (ref 0.9–1.1)
Prothrombin Time: 21.6 s

## 2018-08-08 NOTE — Telephone Encounter (Signed)
appt made

## 2018-08-08 NOTE — Patient Instructions (Signed)
Description   Take 2mg  today, then take 2mg  daily except for Mondays, Wednesdays, and Fridays take 1/2 tablet  INR 1.8  (Goal is between 2-3)  Slightly thick today

## 2018-08-15 ENCOUNTER — Ambulatory Visit: Payer: Medicare Other | Admitting: Cardiology

## 2018-08-25 ENCOUNTER — Other Ambulatory Visit: Payer: Self-pay | Admitting: Pharmacist Clinician (PhC)/ Clinical Pharmacy Specialist

## 2018-09-08 ENCOUNTER — Other Ambulatory Visit: Payer: Self-pay | Admitting: Family Medicine

## 2018-09-11 ENCOUNTER — Other Ambulatory Visit: Payer: Self-pay

## 2018-09-11 ENCOUNTER — Telehealth: Payer: Self-pay | Admitting: Family Medicine

## 2018-09-12 ENCOUNTER — Ambulatory Visit: Payer: Medicare Other | Admitting: Pharmacist Clinician (PhC)/ Clinical Pharmacy Specialist

## 2018-09-12 ENCOUNTER — Ambulatory Visit: Payer: Medicare Other | Admitting: Family Medicine

## 2018-09-12 ENCOUNTER — Encounter: Payer: Self-pay | Admitting: Pharmacist Clinician (PhC)/ Clinical Pharmacy Specialist

## 2018-09-12 DIAGNOSIS — I482 Chronic atrial fibrillation, unspecified: Secondary | ICD-10-CM | POA: Diagnosis not present

## 2018-09-12 LAB — COAGUCHEK XS/INR WAIVED
INR: 1.6 — ABNORMAL HIGH (ref 0.9–1.1)
Prothrombin Time: 18.9 s

## 2018-09-12 NOTE — Patient Instructions (Signed)
Description   Change to taking 2mg  (1 tablet) every day of the week.  This was your old schedule before you went into the hospital.  INR 1.6 (Goal is between 2-3)  Too thick today

## 2018-09-14 ENCOUNTER — Other Ambulatory Visit: Payer: Self-pay | Admitting: Family Medicine

## 2018-09-14 DIAGNOSIS — M62838 Other muscle spasm: Secondary | ICD-10-CM

## 2018-09-20 ENCOUNTER — Ambulatory Visit (INDEPENDENT_AMBULATORY_CARE_PROVIDER_SITE_OTHER): Payer: Medicare Other | Admitting: Family Medicine

## 2018-09-20 ENCOUNTER — Other Ambulatory Visit: Payer: Self-pay

## 2018-09-20 ENCOUNTER — Encounter: Payer: Self-pay | Admitting: Family Medicine

## 2018-09-20 ENCOUNTER — Telehealth: Payer: Self-pay | Admitting: Family Medicine

## 2018-09-20 DIAGNOSIS — K1379 Other lesions of oral mucosa: Secondary | ICD-10-CM

## 2018-09-20 DIAGNOSIS — E039 Hypothyroidism, unspecified: Secondary | ICD-10-CM | POA: Diagnosis not present

## 2018-09-20 MED ORDER — LEVOTHYROXINE SODIUM 88 MCG PO TABS: 88.0000 ug | ORAL_TABLET | Freq: Every day | ORAL | 3 refills | Status: DC

## 2018-09-20 MED ORDER — VALACYCLOVIR HCL 1 G PO TABS: 1000.0000 mg | ORAL_TABLET | Freq: Two times a day (BID) | ORAL | 0 refills | Status: AC

## 2018-09-20 MED ORDER — FIRST-DUKES MOUTHWASH MT SUSP: 10.0000 mL | Freq: Four times a day (QID) | OROMUCOSAL | 0 refills | Status: AC

## 2018-09-20 NOTE — Telephone Encounter (Signed)
Pt is having worsening weakness, loss of taste appt scheduled

## 2018-09-20 NOTE — Progress Notes (Signed)
Virtual Visit via telephone Note Due to COVID-19, visit is conducted virtually and was requested by patient. This visit type was conducted due to national recommendations for restrictions regarding the COVID-19 Pandemic (e.g. social distancing) in an effort to limit this patient's exposure and mitigate transmission in our community. All issues noted in this document were discussed and addressed.  A physical exam was not performed with this format.   I connected with Larry Gates on 09/20/18 at 1645 by telephone and verified that I am speaking with the correct person using two identifiers. Larry Gates is currently located at home and family is currently with them during visit. The provider, Monia Pouch, FNP is located in their office at time of visit.  I discussed the limitations, risks, security and privacy concerns of performing an evaluation and management service by telephone and the availability of in person appointments. I also discussed with the patient that there may be a patient responsible charge related to this service. The patient expressed understanding and agreed to proceed.  Subjective:  [name], male    DOB: August 14, 1928, 85 y.o.   MRN: [id number]  Chief Complaint:  Mouth Lesions   HPI: Larry Gates is a 85 y.o. male presenting on 09/20/2018 for Mouth Lesions   Family reports pt has mouth sores and pain. States he has not been eating and drinking as much due to the sores in his mouth. States he complains when eating and drinking. States he has a blister or ulcer on the tip of his tongue. States he was out on the tractor today and when he came in he was fatigued. They are concerned he will get dehydrated because of the oral pain. No fever, chills, confusion, cough, shortness of breath, chest pain, or headaches.   Mouth Lesions   The current episode started yesterday. The onset was gradual. The symptoms are aggravated by drinking and eating. Associated symptoms  include mouth sores. Pertinent negatives include no orthopnea, no fever, no decreased vision, no double vision, no eye itching, no photophobia, no abdominal pain, no constipation, no diarrhea, no nausea, no vomiting, no congestion, no ear discharge, no ear pain, no headaches, no hearing loss, no rhinorrhea, no sore throat, no stridor, no swollen glands, no muscle aches, no neck pain, no neck stiffness, no cough, no URI, no wheezing, no rash, no eye discharge, no eye pain and no eye redness. He has been less active. He has been drinking less than usual and eating less than usual. Urine output has been normal. The last void occurred less than 6 hours ago. There were no sick contacts.     Relevant past medical, surgical, family, and social history reviewed and updated as indicated.  Allergies and medications reviewed and updated.   Past Medical History:  Diagnosis Date   Anticoagulated on Coumadin    a. Followed @ [facility]   Atypical atrial flutter ([place])    a. Dx 07/2010 and coumadin initiated;  b. 08/2010 s/p DCCV.   Cataract    Hard of hearing    Midsternal chest pain    a. In setting of A Flutter 07/2010;  b. Cath 07/2010: nonobs dzs   Prostate cancer ([place])    TIA (transient ischemic attack)    Vertigo     Past Surgical History:  Procedure Laterality Date   APPENDECTOMY     CARDIOVERSION  10/14/2011   Procedure: CARDIOVERSION;  Surgeon: Lelon Perla, MD;  Location: [facility];  Service: Cardiovascular;  Laterality: N/A;   CARDIOVERSION N/A 07/24/2012   Procedure: CARDIOVERSION;  Surgeon: [name], MD;  Location: [facility] ENDOSCOPY;  Service: Cardiovascular;  Laterality: N/A;   EYE SURGERY Bilateral    cataract   I&D EXTREMITY Left 05/04/2013   Procedure: IRRIGATION AND DEBRIDEMENT of left hand with repair of lacerations and revision amputation of index finger;  Surgeon: Roseanne Kaufman, MD;  Location: [facility];  Service: Orthopedics;  Laterality: Left;   TONSILLECTOMY      Social  History   Socioeconomic History   Marital status: Widowed    Spouse name: Not on file   Number of children: Not on file   Years of education: Not on file   Highest education level: Not on file  Occupational History   Not on file  Social Needs   Financial resource strain: Not on file   Food insecurity:    Worry: Not on file    Inability: Not on file   Transportation needs:    Medical: Not on file    Non-medical: Not on file  Tobacco Use   Smoking status: Never Smoker   Smokeless tobacco: Never Used  Substance and Sexual Activity   Alcohol use: No   Drug use: No   Sexual activity: Never  Lifestyle   Physical activity:    Days per week: Not on file    Minutes per session: Not on file   Stress: Not on file  Relationships   Social connections:    Talks on phone: Not on file    Gets together: Not on file    Attends religious service: Not on file    Active member of club or organization: Not on file    Attends meetings of clubs or organizations: Not on file    Relationship status: Not on file   Intimate partner violence:    Fear of current or ex partner: Not on file    Emotionally abused: Not on file    Physically abused: Not on file    Forced sexual activity: Not on file  Other Topics Concern   Not on file  Social History Narrative   Lives in [place] by himself.  Children live nearby and check in on him daily.    Outpatient Encounter Medications as of 09/20/2018  Medication Sig   amiodarone (PACERONE) 200 MG tablet Take by mouth.   cyanocobalamin 1000 MCG tablet Take by mouth.   diltiazem (DILT-XR) 240 MG 24 hr capsule Take by mouth.   levothyroxine (SYNTHROID) 88 MCG tablet Take 1 tablet (88 mcg total) by mouth daily.   tamsulosin (FLOMAX) 0.4 MG CAPS capsule Take by mouth.   valACYclovir (VALTREX) 1000 MG tablet Take 1 tablet (1,000 mg total) by mouth 2 (two) times daily for 7 days.   [DISCONTINUED] levothyroxine (SYNTHROID) 88 MCG tablet  Take by mouth.   [DISCONTINUED] valACYclovir (VALTREX) 1000 MG tablet Take by mouth.   Diphenhyd-Hydrocort-Nystatin (FIRST-DUKES MOUTHWASH) SUSP 10 mLs by Mouth Rinse route 4 (four) times daily for 10 days.   folic acid (FOLVITE) 1 MG tablet Take 3 mg by mouth daily.   warfarin (COUMADIN) 2 MG tablet Take 1 tablet by mouth once daily   No facility-administered encounter medications on file as of 09/20/2018.     Allergies  Allergen Reactions   Penicillins Rash    Review of Systems  Constitutional: Positive for activity change, appetite change and fatigue. Negative for chills, diaphoresis, fever and unexpected weight change.  HENT: Positive for  mouth sores. Negative for congestion, ear discharge, ear pain, hearing loss, rhinorrhea and sore throat.   Eyes: Negative for double vision, photophobia, pain, discharge, redness and itching.  Respiratory: Negative for cough, shortness of breath, wheezing and stridor.   Cardiovascular: Negative for chest pain, palpitations, orthopnea and leg swelling.  Gastrointestinal: Negative for abdominal pain, constipation, diarrhea, nausea and vomiting.  Musculoskeletal: Negative for arthralgias and neck pain.  Skin: Negative for rash.  Neurological: Negative for dizziness, syncope, weakness, light-headedness and headaches.  Psychiatric/Behavioral: Negative for confusion.  All other systems reviewed and are negative.        Observations/Objective: No vital signs or physical exam, this was a telephone or virtual health encounter.  Pt alert and oriented, answers all questions appropriately, and able to speak in full sentences.    Assessment and Plan: Neil was seen today for mouth lesions.  Diagnoses and all orders for this visit:  Other lesions of oral mucosa Mouth pain Family concerned about pt getting dehydrated due to oral pain and lesions. Pt has had to take antivirals for oral lesions in the past. Pt has ulcer on tip of tongue with burning  and pain. Will treat with below. Follow up in 1 week for reevaluation. Report any new or worsening symptoms.  -     valACYclovir (VALTREX) 1000 MG tablet; Take 1 tablet (1,000 mg total) by mouth 2 (two) times daily for 7 days. -     Diphenhyd-Hydrocort-Nystatin (FIRST-DUKES MOUTHWASH) SUSP; 10 mLs by Mouth Rinse route 4 (four) times daily for 10 days.  Acquired hypothyroidism Pt requested refill. Last TSH 3.86 on 1030/2019. Pt will need follow up appointment for additional refills.  -     levothyroxine (SYNTHROID) 88 MCG tablet; Take 1 tablet (88 mcg total) by mouth daily.     Follow Up Instructions: Return in about 1 week (around 09/27/2018), or if symptoms worsen or fail to improve, for oral lesions, mouth pain.    I discussed the assessment and treatment plan with the patient. The patient was provided an opportunity to ask questions and all were answered. The patient agreed with the plan and demonstrated an understanding of the instructions.   The patient was advised to call back or seek an in-person evaluation if the symptoms worsen or if the condition fails to improve as anticipated.  The above assessment and management plan was discussed with the patient. The patient verbalized understanding of and has agreed to the management plan. Patient is aware to call the clinic if symptoms persist or worsen. Patient is aware when to return to the clinic for a follow-up visit. Patient educated on when it is appropriate to go to the emergency department.    I provided 15 minutes of non-face-to-face time during this encounter. The call started at 1645. The call ended at 1700. The other time was used for coordination of care.    Monia Pouch, FNP-C [facility] [address] [place], [place] [address] [phone]

## 2018-09-25 ENCOUNTER — Other Ambulatory Visit: Payer: Self-pay

## 2018-09-26 ENCOUNTER — Encounter: Payer: Self-pay | Admitting: Pharmacist Clinician (PhC)/ Clinical Pharmacy Specialist

## 2018-09-26 ENCOUNTER — Ambulatory Visit: Payer: Medicare Other | Admitting: Pharmacist Clinician (PhC)/ Clinical Pharmacy Specialist

## 2018-09-26 DIAGNOSIS — I482 Chronic atrial fibrillation, unspecified: Secondary | ICD-10-CM

## 2018-09-26 LAB — COAGUCHEK XS/INR WAIVED
INR: 2.1 — ABNORMAL HIGH (ref 0.9–1.1)
Prothrombin Time: 25.6 s

## 2018-09-26 NOTE — Patient Instructions (Signed)
Description   Continue taking 2mg  (1 tablet) every day of the week.    INR 2.1 (Goal is between 2-3)  Perfect reading today!

## 2018-09-28 DIAGNOSIS — Z20828 Contact with and (suspected) exposure to other viral communicable diseases: Secondary | ICD-10-CM | POA: Diagnosis not present

## 2018-10-09 ENCOUNTER — Telehealth: Payer: Self-pay | Admitting: Family Medicine

## 2018-10-09 ENCOUNTER — Other Ambulatory Visit: Payer: Self-pay

## 2018-10-09 ENCOUNTER — Ambulatory Visit (INDEPENDENT_AMBULATORY_CARE_PROVIDER_SITE_OTHER): Payer: Medicare Other | Admitting: Family Medicine

## 2018-10-09 ENCOUNTER — Encounter: Payer: Self-pay | Admitting: Family Medicine

## 2018-10-09 DIAGNOSIS — I482 Chronic atrial fibrillation, unspecified: Secondary | ICD-10-CM | POA: Diagnosis not present

## 2018-10-09 DIAGNOSIS — R1314 Dysphagia, pharyngoesophageal phase: Secondary | ICD-10-CM

## 2018-10-09 DIAGNOSIS — Z79899 Other long term (current) drug therapy: Secondary | ICD-10-CM | POA: Diagnosis not present

## 2018-10-09 MED ORDER — WARFARIN SODIUM 2 MG PO TABS: ORAL_TABLET | ORAL | 0 refills | Status: DC

## 2018-10-09 MED ORDER — FLUCONAZOLE 100 MG PO TABS: ORAL_TABLET | ORAL | 0 refills | Status: DC

## 2018-10-09 NOTE — Telephone Encounter (Signed)
Yes I adjusted the coumadin for it and had him schedule for an INR visit. Please proceed ASAP.

## 2018-10-09 NOTE — Progress Notes (Signed)
Subjective:    [name], male    DOB: [birth year], 85 y.o.   MRN: [id number]   HPI: Larry Gates is a 85 y.o. male presenting for two weeks of loss of appetite. Tongue has bumps on it. Some relief with acyclovir. A little trouble swallowing. Just a little better. Having to drink to force food down. Not coughing. Recently hospitalized for two weeks with pneumonia. Aspiration? Curently no fever or dyspnea.    Patient in for follow-up of atrial fibrillation. Patient denies any recent bouts of chest pain or palpitations. Additionally, patient is taking anticoagulants. Patient denies any recent excessive bleeding episodes including epistaxis, bleeding from the gums, genitalia, rectal bleeding or hematuria. Additionally there has been no excessive bruising.   Depression screen [facility] 2/9 05/21/2018 02/14/2018 10/04/2017 07/29/2016 10/12/2015  Decreased Interest 0 0 0 0 0  Down, Depressed, Hopeless 0 0 0 0 0  PHQ - 2 Score 0 0 0 0 0     Relevant past medical, surgical, family and social history reviewed and updated as indicated.  Interim medical history since our last visit reviewed. Allergies and medications reviewed and updated.  ROS:  Review of Systems  Constitutional: Negative for fever.  HENT: Positive for hearing loss and trouble swallowing. Negative for congestion.   Respiratory: Negative for shortness of breath.   Cardiovascular: Negative for chest pain.  Musculoskeletal: Negative for arthralgias.  Skin: Negative for rash.     Social History   Tobacco Use  Smoking Status Never Smoker  Smokeless Tobacco Never Used       Objective:     Wt Readings from Last 3 Encounters:  05/26/18 143 lb 8.3 oz (65.1 kg)  05/21/18 140 lb (63.5 kg)  02/14/18 136 lb 9.6 oz (62 kg)     Exam deferred. Pt. Harboring due to COVID 19. Phone visit performed.   Assessment & Plan:   1. Pharyngoesophageal dysphagia   2. Long-term use of high-risk medication   3. Atrial  fibrillation, chronic     Meds ordered this encounter  Medications  . fluconazole (DIFLUCAN) 100 MG tablet    Sig: Take two with first dose. Then starting the next day take one daily until all are taken.    Dispense:  15 tablet    Refill:  0  . warfarin (COUMADIN) 2 MG tablet    Sig: While taking diflucan decrease coumadin from 2 mg daily to 2 mg tab, one qod and 1/2 qod, alternating    Dispense:  90 tablet    Refill:  0    Orders Placed This Encounter  Procedures  . DG OP Swallowing Func-Medicare/Speech Path    Standing Status:   Future    Standing Expiration Date:   12/09/2019    Order Specific Question:   Reason for Exam (SYMPTOM  OR DIAGNOSIS REQUIRED)    Answer:   dysphagia    Order Specific Question:   Where should this test be performed?    Answer:   [facility]  . SLP modified barium swallow    Standing Status:   Future    Standing Expiration Date:   10/09/2019    Order Specific Question:   Where should this test be performed:    Answer:   [facility]      Diagnoses and all orders for this visit:  Pharyngoesophageal dysphagia -     SLP modified barium swallow; Future -     DG OP Swallowing Func-Medicare/Speech Path;  Future  Long-term use of high-risk medication  Atrial fibrillation, chronic  Other orders -     fluconazole (DIFLUCAN) 100 MG tablet; Take two with first dose. Then starting the next day take one daily until all are taken. -     warfarin (COUMADIN) 2 MG tablet; While taking diflucan decrease coumadin from 2 mg daily to 2 mg tab, one qod and 1/2 qod, alternating    Virtual Visit via telephone Note  I discussed the limitations, risks, security and privacy concerns of performing an evaluation and management service by telephone and the availability of in person appointments. The patient was identified with two identifiers. Pt.expressed understanding and agreed to proceed. Pt. Is at home. Dr. Livia Snellen is in his office.  Follow Up Instructions:   I  discussed the assessment and treatment plan with the patient. The patient was provided an opportunity to ask questions and all were answered. The patient agreed with the plan and demonstrated an understanding of the instructions.   The patient was advised to call back or seek an in-person evaluation if the symptoms worsen or if the condition fails to improve as anticipated.   Total minutes including chart review and phone contact time: 20   Follow up plan: Return in about 2 weeks (around 10/23/2018).  Claretta Fraise, MD [facility]

## 2018-10-09 NOTE — Telephone Encounter (Signed)
Pharmacy called and said that there was a contraindication between these 2 meds. Do you still want to continue?

## 2018-10-10 DIAGNOSIS — Z8673 Personal history of transient ischemic attack (TIA), and cerebral infarction without residual deficits: Secondary | ICD-10-CM | POA: Diagnosis not present

## 2018-10-10 DIAGNOSIS — I4892 Unspecified atrial flutter: Secondary | ICD-10-CM | POA: Diagnosis not present

## 2018-10-10 DIAGNOSIS — I10 Essential (primary) hypertension: Secondary | ICD-10-CM | POA: Diagnosis not present

## 2018-10-10 DIAGNOSIS — D709 Neutropenia, unspecified: Secondary | ICD-10-CM | POA: Diagnosis not present

## 2018-10-10 DIAGNOSIS — D696 Thrombocytopenia, unspecified: Secondary | ICD-10-CM | POA: Diagnosis not present

## 2018-10-10 DIAGNOSIS — D649 Anemia, unspecified: Secondary | ICD-10-CM | POA: Diagnosis not present

## 2018-10-10 NOTE — Telephone Encounter (Signed)
Pharmacy aware of provider's advice.

## 2018-10-19 ENCOUNTER — Other Ambulatory Visit: Payer: Self-pay | Admitting: Cardiology

## 2018-10-22 NOTE — Telephone Encounter (Signed)
Rx(s) sent to pharmacy electronically.  

## 2018-10-23 ENCOUNTER — Other Ambulatory Visit: Payer: Self-pay

## 2018-10-24 ENCOUNTER — Ambulatory Visit (INDEPENDENT_AMBULATORY_CARE_PROVIDER_SITE_OTHER): Payer: Medicare Other | Admitting: Family Medicine

## 2018-10-24 ENCOUNTER — Encounter: Payer: Self-pay | Admitting: Family Medicine

## 2018-10-24 VITALS — BP 138/62 | HR 79 | Temp 99.0°F | Ht 66.0 in | Wt 126.0 lb

## 2018-10-24 DIAGNOSIS — Z7901 Long term (current) use of anticoagulants: Secondary | ICD-10-CM

## 2018-10-24 DIAGNOSIS — I4892 Unspecified atrial flutter: Secondary | ICD-10-CM | POA: Diagnosis not present

## 2018-10-24 DIAGNOSIS — B370 Candidal stomatitis: Secondary | ICD-10-CM | POA: Diagnosis not present

## 2018-10-24 DIAGNOSIS — I482 Chronic atrial fibrillation, unspecified: Secondary | ICD-10-CM | POA: Diagnosis not present

## 2018-10-24 LAB — COAGUCHEK XS/INR WAIVED
INR: 7.4 (ref 0.9–1.1)
Prothrombin Time: 84.3 s

## 2018-10-24 LAB — POCT INR: INR: 7.4 — AB (ref 2.0–3.0)

## 2018-10-24 MED ORDER — VITAMIN K1 10 MG/ML IJ SOLN
5.0000 mg | Freq: Once | INTRAMUSCULAR | Status: AC
  Administered 2018-10-24: 5 mg via SUBCUTANEOUS

## 2018-10-24 MED ORDER — CLOTRIMAZOLE 10 MG MT TROC: OROMUCOSAL | 2 refills | Status: DC

## 2018-10-24 NOTE — Patient Instructions (Signed)
No Coumadin Thursday, Friday. Take 1/2 tablet on Saturday and Sunday

## 2018-10-24 NOTE — Progress Notes (Signed)
Subjective:  [name], male    DOB: 10/31/1928  Age: 85 y.o. MRN: [id number]  CC: 2 week recheck   HPI [name] presents for Patient in for follow-up of atrial fibrillation. Patient denies any recent bouts of chest pain or palpitations. Additionally, patient is taking anticoagulants. Patient denies any recent excessive bleeding episodes including epistaxis, bleeding from the gums, genitalia, rectal bleeding or hematuria. Additionally there has been no excessive bruising.  [facility] screen  [facility] 2/[facility] 10/24/2018 05/21/2018 02/14/2018  Decreased Interest 0 0 0  Down, Depressed, Hopeless 0 0 0  PHQ - 2 Score 0 0 0    History [name] has a past medical history of Anticoagulated on Coumadin, Atypical atrial flutter ([place]), Cataract, Hard of hearing, Midsternal chest pain, Prostate cancer ([place]), TIA (transient ischemic attack), and Vertigo.   He has a past surgical history that includes Appendectomy; Tonsillectomy; Cardioversion (10/14/2011); Cardioversion (N/A, 07/24/2012); I&D extremity (Left, 05/04/2013); and Eye surgery (Bilateral).   His family history includes Alzheimer's disease in his father and mother; CVA in his father; Early death in his brother and sister; Heart Problems in his brother; Leukemia in his father.He reports that he has never smoked. He has never used smokeless tobacco. He reports that he does not drink alcohol or use drugs.    ROS Review of Systems  Constitutional: Negative for fever.  Respiratory: Negative for shortness of breath.   Cardiovascular: Negative for chest pain.  Musculoskeletal: Negative for arthralgias.  Skin: Negative for rash.    Objective:  BP 138/62   Pulse 79   Temp 99 F (37.2 C) (Oral)   Ht 5\' 6"  (1.676 m)   Wt 126 lb (57.2 kg)   BMI 20.34 kg/m   BP Readings from Last 3 Encounters:  10/24/18 138/62  05/26/18 (!) 144/85  05/21/18 120/61    Wt Readings from Last 3 Encounters:  10/24/18 126 lb (57.2 kg)  05/26/18 143 lb  8.3 oz (65.1 kg)  05/21/18 140 lb (63.5 kg)     Physical Exam Vitals signs reviewed.  Constitutional:      Appearance: He is well-developed.  HENT:     Head: Normocephalic and atraumatic.     Right Ear: External ear normal.     Left Ear: External ear normal.     Mouth/Throat:     Pharynx: Oropharyngeal exudate (Several gray plaques on tongue) present. No posterior oropharyngeal erythema.  Eyes:     Pupils: Pupils are equal, round, and reactive to light.  Neck:     Musculoskeletal: Normal range of motion and neck supple.  Cardiovascular:     Rate and Rhythm: Normal rate and regular rhythm.     Heart sounds: No murmur.  Pulmonary:     Effort: No respiratory distress.     Breath sounds: Normal breath sounds.  Neurological:     Mental Status: He is alert and oriented to person, place, and time.    Results for orders placed or performed in visit on 10/24/18  CoaguChek XS/INR Waived  Result Value Ref Range   INR 7.4 (HH) 0.9 - 1.1   Prothrombin Time 84.3 sec  POCT INR  Result Value Ref Range   INR 7.4 (A) 2.0 - 3.0      Assessment & Plan:   Phares was seen today for 2 week recheck.  Diagnoses and all orders for this visit:  Atrial fibrillation, chronic -     CoaguChek XS/INR Waived -     phytonadione (  VITAMIN K) SQ injection 5 mg  Atrial flutter, unspecified type ([place])  Long term current use of anticoagulant therapy  Other orders -     clotrimazole (MYCELEX) 10 MG troche; Allow one to dissolve in the mouth 5 times daily For yeast -     POCT INR       I have discontinued Carloyn Manner B. Tracz's cyanocobalamin and fluconazole. I am also having him start on clotrimazole. Additionally, I am having him maintain his tamsulosin, folic acid, amiodarone, levothyroxine, warfarin, and diltiazem. We administered phytonadione.  Allergies as of 10/24/2018      Reactions   Penicillins Rash      Medication List       Accurate as of October 24, 2018 10:00 PM. If you have any  questions, ask your nurse or doctor.        STOP taking these medications   cyanocobalamin 1000 MCG tablet Stopped by: Claretta Fraise, MD   fluconazole 100 MG tablet Commonly known as: Diflucan Stopped by: Claretta Fraise, MD     TAKE these medications   amiodarone 200 MG tablet Commonly known as: PACERONE Take by mouth.   clotrimazole 10 MG troche Commonly known as: MYCELEX Allow one to dissolve in the mouth 5 times daily For yeast Started by: Claretta Fraise, MD   diltiazem 240 MG 24 hr capsule Commonly known as: Dilt-XR Take 1 capsule (240 mg total) by mouth daily.   folic acid 1 MG tablet Commonly known as: FOLVITE Take 3 mg by mouth daily.   levothyroxine 88 MCG tablet Commonly known as: SYNTHROID Take 1 tablet (88 mcg total) by mouth daily.   tamsulosin 0.4 MG Caps capsule Commonly known as: FLOMAX Take by mouth.   warfarin 2 MG tablet Commonly known as: COUMADIN Take as directed by the anticoagulation clinic. If you are unsure how to take this medication, talk to your nurse or doctor. Original instructions: While taking diflucan decrease coumadin from 2 mg daily to 2 mg tab, one qod and 1/2 qod, alternating        Follow-up: Return in about 5 days (around 10/29/2018).  Claretta Fraise, M.D.

## 2018-10-26 ENCOUNTER — Other Ambulatory Visit: Payer: Self-pay

## 2018-10-29 ENCOUNTER — Other Ambulatory Visit: Payer: Self-pay

## 2018-10-29 ENCOUNTER — Encounter: Payer: Self-pay | Admitting: Family Medicine

## 2018-10-29 ENCOUNTER — Ambulatory Visit (INDEPENDENT_AMBULATORY_CARE_PROVIDER_SITE_OTHER): Payer: Medicare Other | Admitting: Family Medicine

## 2018-10-29 DIAGNOSIS — I482 Chronic atrial fibrillation, unspecified: Secondary | ICD-10-CM | POA: Diagnosis not present

## 2018-10-29 LAB — COAGUCHEK XS/INR WAIVED
INR: 1.8 — ABNORMAL HIGH (ref 0.9–1.1)
Prothrombin Time: 22.1 s

## 2018-10-29 LAB — POCT INR: INR: 1.8 — AB (ref 2.0–3.0)

## 2018-10-29 NOTE — Progress Notes (Signed)
Subjective:  [name], male    DOB: 02-09-1929  Age: 85 y.o. MRN: [id number]  CC: Anticoagulation   HPI Larry Gates in for follow-up of atrial fibrillation. Patient denies any recent bouts of chest pain or palpitations. Additionally, patient is taking anticoagulants. Patient denies any recent excessive bleeding episodes including epistaxis, bleeding from the gums, genitalia, rectal bleeding or hematuria. Additionally there has been no excessive bruising. Tongue feels better. Using lozenges.   Depression screen [facility] 2/9 10/29/2018 10/24/2018 05/21/2018  Decreased Interest 0 0 0  Down, Depressed, Hopeless 0 0 0  PHQ - 2 Score 0 0 0    History Larry Gates has a past medical history of Anticoagulated on Coumadin, Atypical atrial flutter ([place]), Cataract, Hard of hearing, Midsternal chest pain, Prostate cancer ([place]), TIA (transient ischemic attack), and Vertigo.   Larry Gates has a past surgical history that includes Appendectomy; Tonsillectomy; Cardioversion (10/14/2011); Cardioversion (N/A, 07/24/2012); I&D extremity (Left, 05/04/2013); and Eye surgery (Bilateral).   His family history includes Alzheimer's disease in his father and mother; CVA in his father; Early death in his brother and sister; Heart Problems in his brother; Leukemia in his father.Larry Gates reports that Larry Gates has never smoked. Larry Gates has never used smokeless tobacco. Larry Gates reports that Larry Gates does not drink alcohol or use drugs.    ROS Review of Systems  Constitutional: Negative for fever.  Respiratory: Negative for shortness of breath.   Cardiovascular: Negative for chest pain.  Musculoskeletal: Negative for arthralgias.  Skin: Negative for rash.    Objective:  BP 134/63   Pulse 71   Temp 97.8 F (36.6 C) (Oral)   Ht 5\' 6"  (1.676 m)   Wt 125 lb 12.8 oz (57.1 kg)   BMI 20.30 kg/m   BP Readings from Last 3 Encounters:  10/29/18 134/63  10/24/18 138/62  05/26/18 (!) 144/85    Wt Readings from Last 3 Encounters:  10/29/18 125 lb  12.8 oz (57.1 kg)  10/24/18 126 lb (57.2 kg)  05/26/18 143 lb 8.3 oz (65.1 kg)     Physical Exam Vitals signs reviewed.  Constitutional:      Appearance: Larry Gates is well-developed.  HENT:     Head: Normocephalic and atraumatic.     Mouth/Throat:     Pharynx: Oropharyngeal exudate (few grey yeast plaques remain on tongue) present. No posterior oropharyngeal erythema.  Eyes:     Pupils: Pupils are equal, round, and reactive to light.  Neck:     Musculoskeletal: Normal range of motion and neck supple.  Cardiovascular:     Rate and Rhythm: Normal rate and regular rhythm.     Heart sounds: Murmur present.  Pulmonary:     Effort: No respiratory distress.     Breath sounds: Normal breath sounds.  Neurological:     Mental Status: Larry Gates is alert and oriented to person, place, and time.       Assessment & Plan:   Larry Gates was seen today for anticoagulation.  Diagnoses and all orders for this visit:  Chronic atrial fibrillation -     CoaguChek XS/INR Waived  Other orders -     POCT INR     I am having Larry Gates maintain his tamsulosin, folic acid, amiodarone, levothyroxine, warfarin, diltiazem, and clotrimazole.  Allergies as of 10/29/2018      Reactions   Penicillins Rash      Medication List       Accurate as of October 29, 2018 11:26 AM. If you have  any questions, ask your nurse or doctor.        amiodarone 200 MG tablet Commonly known as: PACERONE Take by mouth.   clotrimazole 10 MG troche Commonly known as: MYCELEX Allow one to dissolve in the mouth 5 times daily For yeast   diltiazem 240 MG 24 hr capsule Commonly known as: Dilt-XR Take 1 capsule (240 mg total) by mouth daily.   folic acid 1 MG tablet Commonly known as: FOLVITE Take 3 mg by mouth daily.   levothyroxine 88 MCG tablet Commonly known as: SYNTHROID Take 1 tablet (88 mcg total) by mouth daily.   tamsulosin 0.4 MG Caps capsule Commonly known as: FLOMAX Take by mouth.   warfarin 2 MG tablet  Commonly known as: COUMADIN Take as directed by the anticoagulation clinic. If you are unsure how to take this medication, talk to your nurse or doctor. Original instructions: While taking diflucan decrease coumadin from 2 mg daily to 2 mg tab, one qod and 1/2 qod, alternating      INR/Prothrombin Time 1.8 Description   Alternate 1 pill today, 1/2 pill tomorrow, etc      Follow-up: Return in about 1 month (around 11/29/2018) for INR.  Claretta Fraise, M.D.

## 2018-10-29 NOTE — Patient Instructions (Addendum)
     Description   Alternate 1 pill today, 1/2 pill tomorrow, etc

## 2018-11-08 ENCOUNTER — Encounter: Payer: Self-pay | Admitting: Family Medicine

## 2018-12-04 ENCOUNTER — Ambulatory Visit (INDEPENDENT_AMBULATORY_CARE_PROVIDER_SITE_OTHER): Payer: Medicare Other | Admitting: Family Medicine

## 2018-12-04 ENCOUNTER — Encounter: Payer: Self-pay | Admitting: Family Medicine

## 2018-12-04 DIAGNOSIS — I482 Chronic atrial fibrillation, unspecified: Secondary | ICD-10-CM | POA: Diagnosis not present

## 2018-12-04 DIAGNOSIS — Z7901 Long term (current) use of anticoagulants: Secondary | ICD-10-CM

## 2018-12-04 LAB — COAGUCHEK XS/INR WAIVED
INR: 1.5 — ABNORMAL HIGH (ref 0.9–1.1)
Prothrombin Time: 18.2 s

## 2018-12-04 LAB — PROTIME-INR: INR: 1.5 — AB (ref 0.9–1.1)

## 2018-12-04 NOTE — Progress Notes (Addendum)
Subjective:    [name], male    DOB: June 29, 1928, 85 y.o.   MRN: [id number]   HPI: Larry Gates is a 85 y.o. male presenting for Patient in for follow-up of atrial fibrillation. Patient denies any recent bouts of chest pain or palpitations. Additionally, patient is taking anticoagulants. Patient denies any recent excessive bleeding episodes including epistaxis, bleeding from the gums, genitalia, rectal bleeding or hematuria. Additionally there has been moderate  Bruising if he just bumps into something.  To avoid this and to prevent frequent exposure to potential COVID due to lab visits, he would like to start home INR monitoring. History given by Larry Gates. Pt. Is HOH and cannot talk on the phone.   Depression screen [facility] 2/9 10/29/2018 10/24/2018 05/21/2018 02/14/2018 10/04/2017  Decreased Interest 0 0 0 0 0  Down, Depressed, Hopeless 0 0 0 0 0  PHQ - 2 Score 0 0 0 0 0     Relevant past medical, surgical, family and social history reviewed and updated as indicated.  Interim medical history since our last visit reviewed. Allergies and medications reviewed and updated.  ROS:  Review of Systems  Respiratory: Negative for shortness of breath.   Cardiovascular: Negative for chest pain and palpitations.  Hematological: Bruises/bleeds easily.     Social History   Tobacco Use  Smoking Status Never Smoker  Smokeless Tobacco Never Used       Objective:     Wt Readings from Last 3 Encounters:  10/29/18 125 lb 12.8 oz (57.1 kg)  10/24/18 126 lb (57.2 kg)  05/26/18 143 lb 8.3 oz (65.1 kg)     Exam deferred. Pt. Harboring due to COVID 19. Phone visit performed.   Assessment & Plan:   1. Atrial fibrillation, chronic   2. Long term current use of anticoagulant therapy     No orders of the defined types were placed in this encounter.   Orders Placed This Encounter  Procedures  . CoaguChek XS/INR Waived  . Protime-INR    This back office order was created  through the Results Console.     Description   TAke one 2 mg tab every day, except take just 1/2 tab on Thursday and sunday     Diagnoses and all orders for this visit:  Atrial fibrillation, chronic -     CoaguChek XS/INR Waived  Long term current use of anticoagulant therapy -     CoaguChek XS/INR Waived  Other orders -     Protime-INR  Consult for home INR ordered.  Virtual Visit via telephone Note  I discussed the limitations, risks, security and privacy concerns of performing an evaluation and management service by telephone and the availability of in person appointments. The patient was identified with two identifiers. Pt.expressed understanding and agreed to proceed. Pt. Is at home. Dr. Livia Snellen is in his office.  Follow Up Instructions:   I discussed the assessment and treatment plan with the patient. The patient was provided an opportunity to ask questions and all were answered. The patient agreed with the plan and demonstrated an understanding of the instructions.   The patient was advised to call back or seek an in-person evaluation if the symptoms worsen or if the condition fails to improve as anticipated.   Total minutes including chart review and phone contact time: 15   Follow up plan: Return in about 3 months (around 03/06/2019) for plus weekly INR.  Larry Fraise, MD [place]

## 2018-12-09 ENCOUNTER — Other Ambulatory Visit: Payer: Self-pay | Admitting: Cardiology

## 2018-12-17 ENCOUNTER — Other Ambulatory Visit: Payer: Self-pay | Admitting: Family Medicine

## 2018-12-17 ENCOUNTER — Other Ambulatory Visit: Payer: Self-pay | Admitting: *Deleted

## 2018-12-17 DIAGNOSIS — E039 Hypothyroidism, unspecified: Secondary | ICD-10-CM

## 2018-12-17 MED ORDER — LEVOTHYROXINE SODIUM 88 MCG PO TABS: 88.0000 ug | ORAL_TABLET | Freq: Every day | ORAL | 1 refills | Status: DC

## 2018-12-26 ENCOUNTER — Other Ambulatory Visit: Payer: Self-pay | Admitting: *Deleted

## 2018-12-26 DIAGNOSIS — E039 Hypothyroidism, unspecified: Secondary | ICD-10-CM

## 2018-12-26 MED ORDER — LEVOTHYROXINE SODIUM 88 MCG PO TABS: 88.0000 ug | ORAL_TABLET | Freq: Every day | ORAL | 0 refills | Status: DC

## 2019-01-03 ENCOUNTER — Telehealth: Payer: Self-pay | Admitting: *Deleted

## 2019-01-03 LAB — PROTIME-INR: INR: 1.8 — AB (ref 0.9–1.1)

## 2019-01-03 NOTE — Telephone Encounter (Signed)
Fax received mdINR PT/INR self testing service Test date/time 01/03/19 3:14 pm INR 1.8

## 2019-01-04 NOTE — Telephone Encounter (Signed)
Continue coumadin as is. Recheck in one week.

## 2019-01-04 NOTE — Telephone Encounter (Signed)
Pt aware.

## 2019-01-14 ENCOUNTER — Other Ambulatory Visit: Payer: Self-pay | Admitting: Cardiology

## 2019-01-14 ENCOUNTER — Telehealth: Payer: Self-pay | Admitting: *Deleted

## 2019-01-14 NOTE — Telephone Encounter (Signed)
INR 1.9 on 01/11/19. Please advise and route to pools. Nurse to call pt [phone]

## 2019-01-14 NOTE — Telephone Encounter (Signed)
Continue coumadin as is °

## 2019-01-14 NOTE — Telephone Encounter (Signed)
Aware of coumadin dosage

## 2019-01-21 ENCOUNTER — Telehealth: Payer: Self-pay | Admitting: Family Medicine

## 2019-01-21 ENCOUNTER — Telehealth: Payer: Self-pay | Admitting: *Deleted

## 2019-01-21 LAB — PROTIME-INR: INR: 1.9 — AB (ref 0.9–1.1)

## 2019-01-21 NOTE — Telephone Encounter (Signed)
Fax received mdINR PT/INR self testing service Test date/time 01/20/19 9:40 am INR 1.9

## 2019-01-21 NOTE — Telephone Encounter (Signed)
Tell pt. Re coumadin: Description   TAke 2 mg daily except 1 mg on Sundays

## 2019-01-21 NOTE — Telephone Encounter (Signed)
Left message for patient to call to confirm orders for comadin dosing.

## 2019-01-28 ENCOUNTER — Telehealth: Payer: Self-pay | Admitting: *Deleted

## 2019-01-28 NOTE — Telephone Encounter (Signed)
Fax received mdINR PT/INR self testing service Test date/time 01/25/19 7:20 pm INR 2.3

## 2019-01-28 NOTE — Telephone Encounter (Signed)
Continue coumadin as is °

## 2019-01-28 NOTE — Telephone Encounter (Signed)
Pt aware.

## 2019-02-01 ENCOUNTER — Telehealth: Payer: Self-pay | Admitting: *Deleted

## 2019-02-01 DIAGNOSIS — Z7901 Long term (current) use of anticoagulants: Secondary | ICD-10-CM | POA: Diagnosis not present

## 2019-02-01 DIAGNOSIS — I4821 Permanent atrial fibrillation: Secondary | ICD-10-CM | POA: Diagnosis not present

## 2019-02-01 NOTE — Telephone Encounter (Signed)
Fax received mdINR PT/INR self testing service Test date/time 02/01/19 9:58 am INR 2.2

## 2019-02-01 NOTE — Telephone Encounter (Signed)
Called LMTCB. 

## 2019-02-01 NOTE — Telephone Encounter (Signed)
Indication: afib Goal INR: 2-3  Recommendations:  Continue current regimen.  INR is therapeutic

## 2019-02-06 NOTE — Telephone Encounter (Signed)
Left message to please call our office to confirm receiving voice mail ,  Continue same dose of coumadin.

## 2019-02-06 NOTE — Telephone Encounter (Signed)
Aware. 

## 2019-02-07 LAB — PROTIME-INR: INR: 2.4 — AB (ref 0.9–1.1)

## 2019-02-08 ENCOUNTER — Other Ambulatory Visit: Payer: Self-pay | Admitting: Cardiology

## 2019-02-08 ENCOUNTER — Telehealth: Payer: Self-pay | Admitting: Family Medicine

## 2019-02-08 NOTE — Telephone Encounter (Signed)
inr normal range- continue current coumadin dose and recheck  in 4 weeks

## 2019-02-08 NOTE — Telephone Encounter (Signed)
Fax received mdINR PT/INR self testing service Test date/time 02/07/19 7:21pm INR 2.4

## 2019-02-08 NOTE — Telephone Encounter (Signed)
Family aware and verbalized understanding.

## 2019-02-18 ENCOUNTER — Telehealth: Payer: Self-pay | Admitting: *Deleted

## 2019-02-18 LAB — PROTIME-INR: INR: 1.3 — AB (ref 0.9–1.1)

## 2019-02-18 NOTE — Telephone Encounter (Signed)
Please contact the patient re coumadin  Description   2 mg Su, Tues, Thurs, Sat & 3 mg MWF

## 2019-02-18 NOTE — Telephone Encounter (Signed)
Fax received mdINR PT/INR self testing service Test date/time 02/15/19 5:38 pm INR 1.3

## 2019-02-18 NOTE — Telephone Encounter (Signed)
Patient son aware and verbalized understanding.

## 2019-02-19 NOTE — Progress Notes (Signed)
HPI The patient has a history of atrial flutter.  He has had a mildly reduce EF but only minor CAD on cath in 2012.  His most recent EF was normal with some LVH. He has had cardioversion x 2.  He seems to have maintained NSR on amiodarone.   For awhile he was not taking amiodarone or Cardizem.    Since I last saw him he was in the hospital earlier this year with sepsis.  I reviewed these records.  There were no acute cardiovascular events.  He is never gotten his strength back completely.  However, he still living independently.  He drives.  He does his chores.  He is very very very hard of hearing.  The patient denies any new symptoms such as chest discomfort, neck or arm discomfort. There has been no new shortness of breath, PND or orthopnea. There have been no reported palpitations, presyncope or syncope.  February hospital records reviewed for this appointment.  Allergies  Allergen Reactions  . Penicillins Rash    Current Outpatient Medications  Medication Sig Dispense Refill  . amiodarone (PACERONE) 200 MG tablet TAKE 1 TABLET BY MOUTH ONCE DAILY . APPOINTMENT REQUIRED FOR FUTURE REFILLS 30 tablet 0  . DILT-XR 240 MG 24 hr capsule Take 1 capsule by mouth once daily 90 capsule 0  . levothyroxine (SYNTHROID) 88 MCG tablet Take 1 tablet (88 mcg total) by mouth daily. (Needs to be seen before next refill) 30 tablet 0  . warfarin (COUMADIN) 2 MG tablet Take 1 tablet by mouth once daily 90 tablet 0   No current facility-administered medications for this visit.     Past Medical History:  Diagnosis Date  . Anticoagulated on Coumadin    a. Followed @ [facility]  . Atypical atrial flutter ([place])    a. Dx 07/2010 and coumadin initiated;  b. 08/2010 s/p DCCV.  . Cataract   . Hard of hearing   . Midsternal chest pain    a. In setting of A Flutter 07/2010;  b. Cath 07/2010: nonobs dzs  . Prostate cancer ([place])   . TIA (transient ischemic attack)   . Vertigo     Past Surgical History:   Procedure Laterality Date  . APPENDECTOMY    . CARDIOVERSION  10/14/2011   Procedure: CARDIOVERSION;  Surgeon: Lelon Perla, MD;  Location: [facility];  Service: Cardiovascular;  Laterality: N/A;  . CARDIOVERSION N/A 07/24/2012   Procedure: CARDIOVERSION;  Surgeon: [name], MD;  Location: [facility];  Service: Cardiovascular;  Laterality: N/A;  . EYE SURGERY Bilateral    cataract  . I&D EXTREMITY Left 05/04/2013   Procedure: IRRIGATION AND DEBRIDEMENT of left hand with repair of lacerations and revision amputation of index finger;  Surgeon: Roseanne Kaufman, MD;  Location: [facility];  Service: Orthopedics;  Laterality: Left;  . TONSILLECTOMY      ROS:   As stated in the HPI and negative for all other systems.  PHYSICAL EXAM BP (!) 180/60   Pulse 71   Ht 5\' 6"  (1.676 m)   Wt 129 lb (58.5 kg)   BMI 20.82 kg/m   GENERAL: Frail appearing NECK:  No jugular venous distention, waveform within normal limits, carotid upstroke brisk and symmetric, no bruits, no thyromegaly LUNGS:  Clear to auscultation bilaterally CHEST:  Unremarkable HEART:  PMI not displaced or sustained,S1 and S2 within normal limits, no S3, no S4, no clicks, no rubs, 2 out of 6 apical systolic murmur radiating at the aortic  outflow tract mid peaking, no diastolic murmurs ABD:  Flat, positive bowel sounds normal in frequency in pitch, no bruits, no rebound, no guarding, no midline pulsatile mass, no hepatomegaly, no splenomegaly EXT:  2 plus pulses throughout, no edema, no cyanosis no clubbing  EKG: Sinus rhythm, rate 71, axis within normal limits, intervals within normal limits, no acute ST-T wave changes.  QTC slightly prolonged  Lab Results  Component Value Date   TSH 3.860 02/14/2018   ALT 21 05/26/2018   AST 20 05/26/2018   ALKPHOS 50 05/26/2018   BILITOT 0.7 05/26/2018   PROT 5.5 (L) 05/26/2018   ALBUMIN 2.3 (L) 05/26/2018     ASSESSMENT AND PLAN  ATRIAL FLUTTER:   [name] has a CHA2DS2 - VASc  score of 3 with a risk of stroke of 3.2%.  No change in therapy.  He seems to be maintaining sinus rhythm.  I will check a TSH and liver enzymes today.   HTN:   His blood pressure is elevated today but he says it is very well controlled at home.  No change in therapy.

## 2019-02-20 ENCOUNTER — Encounter: Payer: Self-pay | Admitting: Cardiology

## 2019-02-20 ENCOUNTER — Ambulatory Visit (INDEPENDENT_AMBULATORY_CARE_PROVIDER_SITE_OTHER): Payer: Medicare Other | Admitting: Cardiology

## 2019-02-20 ENCOUNTER — Other Ambulatory Visit: Payer: Self-pay

## 2019-02-20 VITALS — BP 180/60 | HR 71 | Ht 66.0 in | Wt 129.0 lb

## 2019-02-20 DIAGNOSIS — I10 Essential (primary) hypertension: Secondary | ICD-10-CM | POA: Diagnosis not present

## 2019-02-20 DIAGNOSIS — Z79899 Other long term (current) drug therapy: Secondary | ICD-10-CM | POA: Diagnosis not present

## 2019-02-20 DIAGNOSIS — R7989 Other specified abnormal findings of blood chemistry: Secondary | ICD-10-CM

## 2019-02-20 DIAGNOSIS — I4892 Unspecified atrial flutter: Secondary | ICD-10-CM | POA: Diagnosis not present

## 2019-02-20 NOTE — Patient Instructions (Signed)
Medication Instructions:  The current medical regimen is effective;  continue present plan and medications.  *If you need a refill on your cardiac medications before your next appointment, please call your pharmacy*  Lab Work: Please have blood work today (Liver profile, TSH) at [facility].  If you have labs (blood work) drawn today and your tests are completely normal, you will receive your results only by: [name] MyChart Message (if you have MyChart) OR . A paper copy in the mail If you have any lab test that is abnormal or we need to change your treatment, we will call you to review the results.  Follow-Up: Follow up in 1 year with Dr. Percival Spanish.  You will receive a letter in the mail 2 months before you are due.  Please call us when you receive this letter to schedule your follow up appointment.  Thank you for choosing [facility]!!

## 2019-02-21 ENCOUNTER — Other Ambulatory Visit: Payer: Self-pay

## 2019-02-22 ENCOUNTER — Telehealth: Payer: Self-pay | Admitting: *Deleted

## 2019-02-22 ENCOUNTER — Ambulatory Visit: Payer: Self-pay | Admitting: Family Medicine

## 2019-02-22 ENCOUNTER — Ambulatory Visit (INDEPENDENT_AMBULATORY_CARE_PROVIDER_SITE_OTHER): Payer: Medicare Other | Admitting: *Deleted

## 2019-02-22 DIAGNOSIS — Z79899 Other long term (current) drug therapy: Secondary | ICD-10-CM | POA: Diagnosis not present

## 2019-02-22 DIAGNOSIS — Z23 Encounter for immunization: Secondary | ICD-10-CM | POA: Diagnosis not present

## 2019-02-22 DIAGNOSIS — I4892 Unspecified atrial flutter: Secondary | ICD-10-CM | POA: Diagnosis not present

## 2019-02-22 DIAGNOSIS — R7989 Other specified abnormal findings of blood chemistry: Secondary | ICD-10-CM

## 2019-02-22 LAB — POCT INR: INR: 2.5 (ref 2.0–3.0)

## 2019-02-22 NOTE — Telephone Encounter (Signed)
Fax received mdINR PT/INR self testing service Test date/time 02/22/19 4:17 pm INR 2.5

## 2019-02-22 NOTE — Progress Notes (Signed)
2 mg Su, Tues, Thurs, Sat & 3 mg MWF  INR at goal, no change

## 2019-02-22 NOTE — Telephone Encounter (Signed)
2 mg Su, Tues, Thurs, Sat & 3 mg MWF  INR at goal, no change

## 2019-02-23 LAB — HEPATIC FUNCTION PANEL
ALT: 13 IU/L (ref 0–44)
AST: 18 IU/L (ref 0–40)
Albumin: 4.2 g/dL (ref 3.6–4.6)
Alkaline Phosphatase: 112 IU/L (ref 39–117)
Bilirubin Total: 1.2 mg/dL (ref 0.0–1.2)
Bilirubin, Direct: 0.33 mg/dL (ref 0.00–0.40)
Total Protein: 6 g/dL (ref 6.0–8.5)

## 2019-02-23 LAB — TSH: TSH: 4.66 u[IU]/mL — ABNORMAL HIGH (ref 0.450–4.500)

## 2019-02-25 ENCOUNTER — Telehealth: Payer: Self-pay | Admitting: *Deleted

## 2019-02-25 DIAGNOSIS — R7989 Other specified abnormal findings of blood chemistry: Secondary | ICD-10-CM

## 2019-02-25 DIAGNOSIS — I4892 Unspecified atrial flutter: Secondary | ICD-10-CM

## 2019-02-25 NOTE — Telephone Encounter (Signed)
-----   Message from Minus Breeding, MD sent at 02/23/2019  2:20 PM EST ----- Very minimally elevated TSH.  Repeat in six months. Call Mr. Litle with the results and send results to Claretta Fraise, MD

## 2019-02-25 NOTE — Telephone Encounter (Signed)
lmtcb

## 2019-02-25 NOTE — Telephone Encounter (Signed)
Left detailed message on machine for Larry Gates, ok per DPR Orders for repeat TSH placed and mailed to patient Sent to PCP

## 2019-02-25 NOTE — Telephone Encounter (Signed)
°  Patient son Larry Gates aware.

## 2019-02-28 ENCOUNTER — Telehealth: Payer: Self-pay | Admitting: *Deleted

## 2019-02-28 DIAGNOSIS — I4821 Permanent atrial fibrillation: Secondary | ICD-10-CM | POA: Diagnosis not present

## 2019-02-28 DIAGNOSIS — Z7901 Long term (current) use of anticoagulants: Secondary | ICD-10-CM | POA: Diagnosis not present

## 2019-02-28 NOTE — Telephone Encounter (Signed)
Continue coumadin as is °

## 2019-02-28 NOTE — Telephone Encounter (Signed)
Fax received mdINR PT/INR self testing service Test date/time 02/28/19 12:06 pm INR 3.0

## 2019-03-01 NOTE — Telephone Encounter (Signed)
Pt aware.

## 2019-03-08 ENCOUNTER — Telehealth: Payer: Self-pay | Admitting: *Deleted

## 2019-03-08 NOTE — Telephone Encounter (Signed)
Continue current coumadin dose and recheck in 4 weeks 

## 2019-03-08 NOTE — Telephone Encounter (Signed)
Fax received mdINR PT/INR self testing service Test date/time 03/07/19 5:20 pm INR 2.7

## 2019-03-08 NOTE — Telephone Encounter (Signed)
Pt aware.

## 2019-03-09 ENCOUNTER — Other Ambulatory Visit: Payer: Self-pay | Admitting: Cardiology

## 2019-03-09 ENCOUNTER — Other Ambulatory Visit: Payer: Self-pay | Admitting: Family Medicine

## 2019-03-18 ENCOUNTER — Telehealth: Payer: Self-pay | Admitting: *Deleted

## 2019-03-18 LAB — PROTIME-INR: INR: 4.3 — AB (ref 0.9–1.1)

## 2019-03-18 NOTE — Telephone Encounter (Signed)
Patients son notified and verbalized understanding.

## 2019-03-18 NOTE — Telephone Encounter (Signed)
Please contact the patient re coumdain Description   Hold today, then 2 mg daily. Recheck 1 week.

## 2019-03-18 NOTE — Telephone Encounter (Signed)
Fax received mdINR PT/INR self testing service Test date/time 03/13/19 6:49 pm INR 4.3

## 2019-03-21 LAB — POCT INR: INR: 3.7 — AB (ref 2.0–3.0)

## 2019-03-22 ENCOUNTER — Telehealth: Payer: Self-pay | Admitting: *Deleted

## 2019-03-22 NOTE — Telephone Encounter (Signed)
Description   Hold today, then 2 mg daily. Recheck 1 week.

## 2019-03-22 NOTE — Telephone Encounter (Signed)
Fax received mdINR PT/INR self testing service Test date/time 03/21/19 5:17 pm INR 3.7

## 2019-03-22 NOTE — Telephone Encounter (Signed)
Pt's son aware of MD feedback and voiced understanding.

## 2019-03-28 DIAGNOSIS — Z7901 Long term (current) use of anticoagulants: Secondary | ICD-10-CM | POA: Diagnosis not present

## 2019-03-28 DIAGNOSIS — I4821 Permanent atrial fibrillation: Secondary | ICD-10-CM | POA: Diagnosis not present

## 2019-03-29 ENCOUNTER — Telehealth: Payer: Self-pay | Admitting: *Deleted

## 2019-03-29 NOTE — Telephone Encounter (Signed)
Fax received mdINR PT/INR self testing service Test date/time 03/28/19 6:47 pm INR 3.1

## 2019-03-29 NOTE — Telephone Encounter (Signed)
Continue 2mg  daily except 3mg  on m,w and friday

## 2019-03-29 NOTE — Telephone Encounter (Signed)
Pt aware and voiced understanding 

## 2019-04-01 DIAGNOSIS — R918 Other nonspecific abnormal finding of lung field: Secondary | ICD-10-CM | POA: Diagnosis not present

## 2019-04-01 DIAGNOSIS — I10 Essential (primary) hypertension: Secondary | ICD-10-CM | POA: Diagnosis not present

## 2019-04-01 DIAGNOSIS — R0602 Shortness of breath: Secondary | ICD-10-CM | POA: Diagnosis not present

## 2019-04-01 DIAGNOSIS — I4892 Unspecified atrial flutter: Secondary | ICD-10-CM | POA: Diagnosis not present

## 2019-04-01 DIAGNOSIS — Z880 Allergy status to penicillin: Secondary | ICD-10-CM | POA: Diagnosis not present

## 2019-04-02 NOTE — Progress Notes (Deleted)
Cardiology Office Note   Date:  04/02/2019   ID:  Larry Gates, DOB May 21, 1928, MRN [id number]  PCP:  Claretta Fraise, MD  Cardiologist:   No primary care provider on file. Referring:  ***  No chief complaint on file.     History of Present Illness: Larry Gates is a 85 y.o. male who presents for follow up of atrial flutter.  He has had a mildly reduce EF but only minor CAD on cath in 2012.  His most recent EF was normal with some LVH. He has had cardioversion x 2.  He seems to have maintained NSR on amiodarone.   For awhile he was not taking amiodarone or Cardizem.  He was in the hospital earlier this year with sepsis.  I reviewed these records. ***   *** There were no acute cardiovascular events.  He is never gotten his strength back completely.  However, he still living independently.  He drives.  He does his chores.  He is very very very hard of hearing.  The patient denies any new symptoms such as chest discomfort, neck or arm discomfort. There has been no new shortness of breath, PND or orthopnea. There have been no reported palpitations, presyncope or syncope.    Past Medical History:  Diagnosis Date  . Anticoagulated on Coumadin    a. Followed @ [facility]  . Atypical atrial flutter ([place])    a. Dx 07/2010 and coumadin initiated;  b. 08/2010 s/p DCCV.  . Cataract   . Hard of hearing   . Midsternal chest pain    a. In setting of A Flutter 07/2010;  b. Cath 07/2010: nonobs dzs  . Prostate cancer ([place])   . TIA (transient ischemic attack)   . Vertigo     Past Surgical History:  Procedure Laterality Date  . APPENDECTOMY    . CARDIOVERSION  10/14/2011   Procedure: CARDIOVERSION;  Surgeon: Lelon Perla, MD;  Location: [place];  Service: Cardiovascular;  Laterality: N/A;  . CARDIOVERSION N/A 07/24/2012   Procedure: CARDIOVERSION;  Surgeon: [name], MD;  Location: [place];  Service: Cardiovascular;  Laterality: N/A;  . EYE SURGERY Bilateral    cataract  . I&D  EXTREMITY Left 05/04/2013   Procedure: IRRIGATION AND DEBRIDEMENT of left hand with repair of lacerations and revision amputation of index finger;  Surgeon: Roseanne Kaufman, MD;  Location: [place];  Service: Orthopedics;  Laterality: Left;  . TONSILLECTOMY       Current Outpatient Medications  Medication Sig Dispense Refill  . DILT-XR 240 MG 24 hr capsule Take 1 capsule by mouth once daily 90 capsule 0  . levothyroxine (SYNTHROID) 88 MCG tablet Take 1 tablet (88 mcg total) by mouth daily. (Needs to be seen before next refill) 30 tablet 0  . PACERONE 200 MG tablet TAKE 1 TABLET BY MOUTH ONCE DAILY . APPOINTMENT REQUIRED FOR FUTURE REFILLS 30 tablet 0  . warfarin (COUMADIN) 2 MG tablet Take 1 tablet by mouth once daily 90 tablet 0   No current facility-administered medications for this visit.    Allergies:   Penicillins    ROS:  Please see the history of present illness.   Otherwise, review of systems are positive for {NONE DEFAULTED:18576::"none"}.   All other systems are reviewed and negative.    PHYSICAL EXAM: VS:  There were no vitals taken for this visit. , BMI There is no height or weight on file to calculate BMI. GENERAL:  Well appearing NECK:  No jugular venous distention, waveform within normal limits, carotid upstroke brisk and symmetric, no bruits, no thyromegaly LUNGS:  Clear to auscultation bilaterally BACK:  No CVA tenderness CHEST:  Unremarkable HEART:  PMI not displaced or sustained,S1 and S2 within normal limits, no S3, no S4, no clicks, no rubs, *** murmurs ABD:  Flat, positive bowel sounds normal in frequency in pitch, no bruits, no rebound, no guarding, no midline pulsatile mass, no hepatomegaly, no splenomegaly EXT:  2 plus pulses throughout, no edema, no cyanosis no clubbing    EKG:  EKG {ACTION; IS/IS HWE:[id number]} ordered today. The ekg ordered today demonstrates ***   Recent Labs: 05/26/2018: BUN 107; Creatinine, Ser 3.07; Hemoglobin 11.4; Magnesium 2.2;  Platelets 174; Potassium 3.2; Sodium 146 02/22/2019: ALT 13; TSH 4.660    Lipid Panel    Component Value Date/Time   CHOL 146 02/14/2018 0756   TRIG 57 02/14/2018 0756   HDL 57 02/14/2018 0756   CHOLHDL 2.6 02/14/2018 0756   CHOLHDL 2 11/25/2010 0943   VLDL 11.0 11/25/2010 0943   LDLCALC 78 02/14/2018 0756      Wt Readings from Last 3 Encounters:  02/20/19 129 lb (58.5 kg)  10/29/18 125 lb 12.8 oz (57.1 kg)  10/24/18 126 lb (57.2 kg)      Other studies Reviewed: Additional studies/ records that were reviewed today include: ***. Review of the above records demonstrates:  Please see elsewhere in the note.  ***   ASSESSMENT AND PLAN:  ATRIAL FLUTTER:   [name] has a CHA2DS2 - VASc score of 3.  ***  with a risk of stroke of 3.2%.  No change in therapy.  He seems to be maintaining sinus rhythm.  I will check a TSH and liver enzymes today.   HTN:   His blood pressure is *** elevated today but he says it is very well controlled at home.  No change in therapy.   COVID EDUCATION:  ***     Current medicines are reviewed at length with the patient today.  The patient {ACTIONS; HAS/DOES NOT HAVE:19233} concerns regarding medicines.  The following changes have been made:  {PLAN; NO CHANGE:13088:s}  Labs/ tests ordered today include: *** No orders of the defined types were placed in this encounter.    Disposition:   FU with ***    Signed, Minus Breeding, MD  04/02/2019 10:53 AM    [facility]

## 2019-04-03 ENCOUNTER — Ambulatory Visit (INDEPENDENT_AMBULATORY_CARE_PROVIDER_SITE_OTHER): Payer: Medicare Other | Admitting: Cardiology

## 2019-04-03 ENCOUNTER — Other Ambulatory Visit: Payer: Self-pay

## 2019-04-03 ENCOUNTER — Encounter

## 2019-04-03 ENCOUNTER — Encounter: Payer: Self-pay | Admitting: Cardiology

## 2019-04-03 VITALS — BP 176/83 | HR 69 | Temp 96.8°F | Ht 66.0 in | Wt 129.8 lb

## 2019-04-03 DIAGNOSIS — I110 Hypertensive heart disease with heart failure: Secondary | ICD-10-CM

## 2019-04-03 DIAGNOSIS — I509 Heart failure, unspecified: Secondary | ICD-10-CM

## 2019-04-03 DIAGNOSIS — I10 Essential (primary) hypertension: Secondary | ICD-10-CM

## 2019-04-03 DIAGNOSIS — Z95810 Presence of automatic (implantable) cardiac defibrillator: Secondary | ICD-10-CM

## 2019-04-03 DIAGNOSIS — Z7189 Other specified counseling: Secondary | ICD-10-CM

## 2019-04-03 DIAGNOSIS — I4892 Unspecified atrial flutter: Secondary | ICD-10-CM | POA: Diagnosis not present

## 2019-04-03 MED ORDER — LOSARTAN POTASSIUM 25 MG PO TABS: 12.5000 mg | ORAL_TABLET | Freq: Every day | ORAL | 3 refills | Status: DC

## 2019-04-03 MED ORDER — FUROSEMIDE 20 MG PO TABS: ORAL_TABLET | ORAL | 3 refills | Status: DC

## 2019-04-03 NOTE — Progress Notes (Signed)
Cardiology Office Note   Date:  04/03/2019   ID:  Larry Gates, DOB [birth year], MRN [id number]  PCP:  Claretta Fraise, MD  Cardiologist:   No primary care provider on file. Referring:  None  Chief Complaint  Patient presents with  . Shortness of Breath      History of Present Illness: Larry Gates is a 85 y.o. male who presents for follow up of atrial flutter.  He has had a mildly reduce EF but only minor CAD on cath in 2012.  His most recent EF was normal with some LVH. He has had cardioversion x 2.  He seems to have maintained NSR on amiodarone.   For awhile he was not taking amiodarone or Cardizem.  He was in the hospital earlier this year with sepsis.  I reviewed these records.   He also was at [facility] emergency room recently and was complaining of shortness of breath.  He gone down to about building but when he came back up he was short of breath.  I reviewed these records.  They gave him a dose of IV Lasix.  An EKG mentions atrial fibrillation but I cannot actually pull up the images.  He really denies any palpitations, presyncope or syncope.  He said he was short of breath but he was not having any chest pressure, neck or arm discomfort.  He has not been having any PND or orthopnea.  He has had no weight gain or edema.  His blood pressure is elevated today.   Past Medical History:  Diagnosis Date  . Anticoagulated on Coumadin    a. Followed @ [facility]  . Atypical atrial flutter ([place])    a. Dx 07/2010 and coumadin initiated;  b. 08/2010 s/p DCCV.  . Cataract   . Hard of hearing   . Midsternal chest pain    a. In setting of A Flutter 07/2010;  b. Cath 07/2010: nonobs dzs  . Prostate cancer ([place])   . TIA (transient ischemic attack)   . Vertigo     Past Surgical History:  Procedure Laterality Date  . APPENDECTOMY    . CARDIOVERSION  10/14/2011   Procedure: CARDIOVERSION;  Surgeon: Lelon Perla, MD;  Location: [place];  Service: Cardiovascular;  Laterality: N/A;  .  CARDIOVERSION N/A 07/24/2012   Procedure: CARDIOVERSION;  Surgeon: [name], MD;  Location: [place];  Service: Cardiovascular;  Laterality: N/A;  . EYE SURGERY Bilateral    cataract  . I&D EXTREMITY Left 05/04/2013   Procedure: IRRIGATION AND DEBRIDEMENT of left hand with repair of lacerations and revision amputation of index finger;  Surgeon: Roseanne Kaufman, MD;  Location: [place];  Service: Orthopedics;  Laterality: Left;  . TONSILLECTOMY       Current Outpatient Medications  Medication Sig Dispense Refill  . DILT-XR 240 MG 24 hr capsule Take 1 capsule by mouth once daily 90 capsule 0  . levothyroxine (SYNTHROID) 88 MCG tablet Take 1 tablet (88 mcg total) by mouth daily. (Needs to be seen before next refill) 30 tablet 0  . PACERONE 200 MG tablet TAKE 1 TABLET BY MOUTH ONCE DAILY . APPOINTMENT REQUIRED FOR FUTURE REFILLS 30 tablet 0  . warfarin (COUMADIN) 2 MG tablet Take 1 tablet by mouth once daily 90 tablet 0  . furosemide (LASIX) 20 MG tablet Take 1 tablet (20 mg total) by mouth daily for two days. After the two days, take only AS NEEDED. 90 tablet 3  . losartan (COZAAR) 25  MG tablet Take 0.5 tablets (12.5 mg total) by mouth daily. 45 tablet 3   No current facility-administered medications for this visit.    Allergies:   Penicillins    ROS:  Please see the history of present illness.   Otherwise, review of systems are positive for none.   All other systems are reviewed and negative.    PHYSICAL EXAM: VS:  BP (!) 176/83   Pulse 69   Temp (!) 96.8 F (36 C)   Ht 5\' 6"  (1.676 m)   Wt 129 lb 12.8 oz (58.9 kg)   SpO2 96%   BMI 20.95 kg/m  , BMI Body mass index is 20.95 kg/m. GENERAL:  Well appearing NECK:  No jugular venous distention, waveform within normal limits, carotid upstroke brisk and symmetric, no bruits, no thyromegaly LUNGS:  Clear to auscultation bilaterally BACK:  No CVA tenderness CHEST:  Unremarkable HEART:  PMI not displaced or sustained,S1 and S2  within normal limits, no S3, no S4, no clicks, no rubs, 2 out of 6 apical systolic murmur radiating slightly at the aortic outflow tract murmurs ABD:  Flat, positive bowel sounds normal in frequency in pitch, no bruits, no rebound, no guarding, no midline pulsatile mass, no hepatomegaly, no splenomegaly EXT:  2 plus pulses throughout, no edema, no cyanosis no clubbing    EKG:  EKG is ordered today. The ekg ordered today demonstrates sinus rhythm, rate 68, axis within normal limits, premature atrial contractions, no acute ST-T wave changes.   Recent Labs: 05/26/2018: BUN 107; Creatinine, Ser 3.07; Hemoglobin 11.4; Magnesium 2.2; Platelets 174; Potassium 3.2; Sodium 146 02/22/2019: ALT 13; TSH 4.660    Lipid Panel    Component Value Date/Time   CHOL 146 02/14/2018 0756   TRIG 57 02/14/2018 0756   HDL 57 02/14/2018 0756   CHOLHDL 2.6 02/14/2018 0756   CHOLHDL 2 11/25/2010 0943   VLDL 11.0 11/25/2010 0943   LDLCALC 78 02/14/2018 0756      Wt Readings from Last 3 Encounters:  04/03/19 129 lb 12.8 oz (58.9 kg)  02/20/19 129 lb (58.5 kg)  10/29/18 125 lb 12.8 oz (57.1 kg)      Other studies Reviewed: Additional studies/ records that were reviewed today include: Care Everywhere records. Review of the above records demonstrates:  Please see elsewhere in the note.     ASSESSMENT AND PLAN:  ATRIAL FLUTTER:   [name] has a CHA2DS2 - VASc score of 3.   He is in sinus rhythm today.  He tolerates anticoagulation.  We talked about getting an Alive Cor.    ACUTE DIASTOLIC HF: It sounds like he had acute diastolic heart failure.  He has had renal insufficiency before therapy be careful but I am getting give him a low-dose of Lasix 20 mg daily.  I am also going to control his blood pressure is below.  He will need a basic metabolic profile at his primary care office in about 7 days.  He will only take the Lasix for 3 days and then we will have it for as needed.  HTN:   His blood  pressure is elevated.  We are going to keep a blood pressure diary but I am going to assume this is part of the problem and start him on Cozaar 12.5 mg daily.  Again watching his creatinine very closely.   ICD:    COVID EDUCATION:  We talked about the vaccine.       Current medicines are  reviewed at length with the patient today.  The patient does not have concerns regarding medicines.  The following changes have been made:  As above  Labs/ tests ordered today include:   Orders Placed This Encounter  Procedures  . Basic metabolic panel  . EKG 12-Lead     Disposition:   FU with in about two weeks.     Signed, Minus Breeding, MD  04/03/2019 12:48 PM    [facility]

## 2019-04-03 NOTE — Patient Instructions (Addendum)
Medication Instructions:  Your physician has recommended you make the following change in your medication:   START LOSARTAN (COZAAR) 12.5 MG. ONE TABLET BY MOUTH DAILY  TAKE FUROSEMIDE (LASIX) 20 MG; ONE TABLET BY MOUTH DAILY FOR TWO DAYS.  AFTER THE TWO DAYS, TAKE LASIX 20 MG AS NEEDED.  *If you need a refill on your cardiac medications before your next appointment, please call your pharmacy*  Lab Work: Your physician recommends that you return for lab work in 1 WEEK:  [place]     If you have labs (blood work) drawn today and your tests are completely normal, you will receive your results only by: [name] MyChart Message (if you have MyChart) OR . A paper copy in the mail If you have any lab test that is abnormal or we need to change your treatment, we will call you to review the results.  Testing/Procedures: NONE  Follow-Up: At [facility], you and your health needs are our priority.  As part of our continuing mission to provide you with exceptional heart care, we have created designated Provider Care Teams.  These Care Teams include your primary Cardiologist (physician) and Advanced Practice Providers (APPs -  Physician Assistants and Nurse Practitioners) who all work together to provide you with the care you need, when you need it.  Your next appointment:   Approximately 2 week(s)  The format for your next appointment:   IN PERSON  Provider:   You may see one of the following Advanced Practice Providers on your designated Care Team:    Rosaria Ferries, PA-C  Jory Sims, DNP, ANP  Cadence Kathlen Mody, NP  Other Instructions KEEP A DAILY BLOOD PRESSURE DIARY

## 2019-04-04 ENCOUNTER — Telehealth: Payer: Self-pay | Admitting: *Deleted

## 2019-04-04 NOTE — Telephone Encounter (Signed)
Patient aware and verbalized understanding. °

## 2019-04-04 NOTE — Telephone Encounter (Signed)
Continue coumadin as is. INR is a little high. Eat a little more Vit. K containing foodss like, spinach and other greens.

## 2019-04-04 NOTE — Telephone Encounter (Signed)
Fax received mdINR PT/INR self testing service Test date/time 04/04/19 11:50 am INR 3.4

## 2019-04-05 ENCOUNTER — Ambulatory Visit: Payer: Medicare Other | Admitting: Cardiology

## 2019-04-09 ENCOUNTER — Other Ambulatory Visit: Payer: Self-pay

## 2019-04-09 ENCOUNTER — Other Ambulatory Visit: Payer: Medicare Other

## 2019-04-09 DIAGNOSIS — I10 Essential (primary) hypertension: Secondary | ICD-10-CM | POA: Diagnosis not present

## 2019-04-10 LAB — BASIC METABOLIC PANEL
BUN/Creatinine Ratio: 23 (ref 10–24)
BUN: 24 mg/dL (ref 8–27)
CO2: 21 mmol/L (ref 20–29)
Calcium: 9 mg/dL (ref 8.6–10.2)
Chloride: 108 mmol/L — ABNORMAL HIGH (ref 96–106)
Creatinine, Ser: 1.05 mg/dL (ref 0.76–1.27)
GFR calc Af Amer: 73 mL/min/{1.73_m2} (ref >59)
GFR calc non Af Amer: 63 mL/min/{1.73_m2} (ref >59)
Glucose: 150 mg/dL — ABNORMAL HIGH (ref 65–99)
Potassium: 4.2 mmol/L (ref 3.5–5.2)
Sodium: 143 mmol/L (ref 134–144)

## 2019-04-11 ENCOUNTER — Other Ambulatory Visit: Payer: Self-pay | Admitting: Family Medicine

## 2019-04-11 ENCOUNTER — Other Ambulatory Visit: Payer: Self-pay | Admitting: Cardiology

## 2019-04-11 DIAGNOSIS — E039 Hypothyroidism, unspecified: Secondary | ICD-10-CM

## 2019-04-15 ENCOUNTER — Ambulatory Visit: Payer: Self-pay | Admitting: Family Medicine

## 2019-04-15 ENCOUNTER — Telehealth: Payer: Self-pay | Admitting: *Deleted

## 2019-04-15 LAB — POCT INR: INR: 4.5 — AB (ref 2.0–3.0)

## 2019-04-15 NOTE — Telephone Encounter (Signed)
Pt's son aware and voiced understanding

## 2019-04-15 NOTE — Progress Notes (Signed)
INR 4.5. Take 2 mg of coumadin every Saturday and Sunday and 3 mg of coumadin all other days. Recheck in 1 week.

## 2019-04-15 NOTE — Telephone Encounter (Signed)
INR 4.5. Take 2 mg of coumadin every Saturday and Sunday and 3 mg of coumadin all other days. Recheck in 1 week.

## 2019-04-15 NOTE — Telephone Encounter (Signed)
Fax received mdINR PT/INR self testing service Test date/time 04/11/19 8:39 am INR 4.5

## 2019-04-17 ENCOUNTER — Telehealth: Payer: Self-pay | Admitting: *Deleted

## 2019-04-17 ENCOUNTER — Other Ambulatory Visit: Payer: Self-pay

## 2019-04-17 ENCOUNTER — Ambulatory Visit (INDEPENDENT_AMBULATORY_CARE_PROVIDER_SITE_OTHER): Payer: Medicare Other | Admitting: Adult Health

## 2019-04-17 ENCOUNTER — Ambulatory Visit (INDEPENDENT_AMBULATORY_CARE_PROVIDER_SITE_OTHER): Payer: Medicare Other | Admitting: Pharmacist

## 2019-04-17 ENCOUNTER — Encounter: Payer: Self-pay | Admitting: Adult Health

## 2019-04-17 ENCOUNTER — Ambulatory Visit: Payer: Self-pay | Admitting: Family Medicine

## 2019-04-17 VITALS — BP 136/68 | HR 69 | Temp 97.1°F | Ht 66.0 in | Wt 128.0 lb

## 2019-04-17 DIAGNOSIS — I5032 Chronic diastolic (congestive) heart failure: Secondary | ICD-10-CM

## 2019-04-17 DIAGNOSIS — I10 Essential (primary) hypertension: Secondary | ICD-10-CM | POA: Diagnosis not present

## 2019-04-17 DIAGNOSIS — Z79899 Other long term (current) drug therapy: Secondary | ICD-10-CM

## 2019-04-17 DIAGNOSIS — I482 Chronic atrial fibrillation, unspecified: Secondary | ICD-10-CM

## 2019-04-17 DIAGNOSIS — I480 Paroxysmal atrial fibrillation: Secondary | ICD-10-CM

## 2019-04-17 DIAGNOSIS — I4892 Unspecified atrial flutter: Secondary | ICD-10-CM

## 2019-04-17 LAB — POCT INR
INR: 4.6 — AB (ref 2.0–3.0)
INR: 5.3 — AB (ref 2.0–3.0)

## 2019-04-17 MED ORDER — WARFARIN SODIUM 2 MG PO TABS: ORAL_TABLET | ORAL | 0 refills | Status: DC

## 2019-04-17 NOTE — Telephone Encounter (Signed)
lmtcb

## 2019-04-17 NOTE — Telephone Encounter (Signed)
Fax received mdINR PT/INR self testing service Test date/time 04/17/19 11:25 am INR 4.6

## 2019-04-17 NOTE — Patient Instructions (Signed)
HOLD warfarin dose tomorrow Dec/31, take 1mg  Friday & Saturday, the resume at 2mg  daily except for 3mg  every Monday and Friday. Next INR on January 11

## 2019-04-17 NOTE — Progress Notes (Signed)
Cardiology Office Note   Date:  04/17/2019   ID:  JABIER DEESE, DOB Jan 31, 1929, MRN [id number]  PCP:  Claretta Fraise, MD  Cardiologist:  Dr. Percival Spanish  No chief complaint on file.    History of Present Illness: Larry Gates is a 85 y.o. male who presents for ongoing assessment and management of atrial flutter s/p DCCV X2, and is maintained in NSR on amiodarone.   Was seen at [facility] ED with complaints of dyspnea and was given IV diuretics and was found to be in atrial fibrillation. Jeris Penta last seen by Dr. Stanford Breed, he was in NSR. He remained on coumadin for CHADS VASC Score of 3.  He was starred on low dose lasix of 20 mg daily for 3 days and then prn, with repeat BMET per PCP. He was also stared on Cozaar 12.5 mg daily.   Mr. Dematteo is here today with his son. BP recordings are a little elevated at home, but are very labile.They range from 116.58 to 176/88 per his home BP machine. He is normal today on his reading.   He is followed buy Dr. Felipa Eth for INR but there is some frustration on this, as his INR has been elevated at 4.5 and 4.6 the last two INR checks. He was told to continue to take the coumadin at 3 mg daily. I think the INR is elevated due to amiodarone.    Past Medical History:  Diagnosis Date  . Anticoagulated on Coumadin    a. Followed @ [facility]  . Atypical atrial flutter ([place])    a. Dx 07/2010 and coumadin initiated;  b. 08/2010 s/p DCCV.  . Cataract   . Hard of hearing   . Midsternal chest pain    a. In setting of A Flutter 07/2010;  b. Cath 07/2010: nonobs dzs  . Prostate cancer ([place])   . TIA (transient ischemic attack)   . Vertigo     Past Surgical History:  Procedure Laterality Date  . APPENDECTOMY    . CARDIOVERSION  10/14/2011   Procedure: CARDIOVERSION;  Surgeon: Lelon Perla, MD;  Location: [place];  Service: Cardiovascular;  Laterality: N/A;  . CARDIOVERSION N/A 07/24/2012   Procedure: CARDIOVERSION;  Surgeon: [name], MD;  Location: [place];   Service: Cardiovascular;  Laterality: N/A;  . EYE SURGERY Bilateral    cataract  . I & D EXTREMITY Left 05/04/2013   Procedure: IRRIGATION AND DEBRIDEMENT of left hand with repair of lacerations and revision amputation of index finger;  Surgeon: Roseanne Kaufman, MD;  Location: [place];  Service: Orthopedics;  Laterality: Left;  . TONSILLECTOMY       Current Outpatient Medications  Medication Sig Dispense Refill  . amiodarone (PACERONE) 200 MG tablet Take 200 mg by mouth daily.    [name] DILT-XR 240 MG 24 hr capsule Take 1 capsule by mouth once daily 90 capsule 0  . furosemide (LASIX) 20 MG tablet Take 1 tablet (20 mg total) by mouth daily for two days. After the two days, take only AS NEEDED. 90 tablet 3  . levothyroxine (SYNTHROID) 88 MCG tablet Take 1 tablet by mouth once daily 90 tablet 0  . losartan (COZAAR) 25 MG tablet Take 0.5 tablets (12.5 mg total) by mouth daily. 45 tablet 3  . warfarin (COUMADIN) 2 MG tablet Take 1 tablet by mouth once daily 90 tablet 0   No current facility-administered medications for this visit.    Allergies:   Penicillins    Social History:  The patient  reports that he has never smoked. He has never used smokeless tobacco. He reports that he does not drink alcohol or use drugs.   Family History:  The patient's family history includes Alzheimer's disease in his father and mother; CVA in his father; Early death in his brother and sister; Heart Problems in his brother; Leukemia in his father.    ROS: All other systems are reviewed and negative. Unless otherwise mentioned in H&P    PHYSICAL EXAM: VS:  BP 136/68   Pulse 69   Temp (!) 97.1 F (36.2 C)   Ht 5\' 6"  (1.676 m)   Wt 128 lb (58.1 kg)   SpO2 99%   BMI 20.66 kg/m  , BMI Body mass index is 20.66 kg/m. GEN: Well nourished, well developed, in no acute distress HEENT: normal Neck: no JVD, carotid bruits, or masses Cardiac: RRR 2/6 harsh systlolic murmur, rubs, or gallops,no edema  Respiratory:   Clear to auscultation bilaterally, normal work of breathing GI: soft, nontender, nondistended, + BS MS: no deformity or atrophy Skin: warm and dry, no rash Neuro:  Strength and sensation are intact. Very hard of hearing.  Psych: euthymic mood, full affect   EKG:  Not completed this office visit.   Recent Labs: 05/26/2018: Hemoglobin 11.4; Magnesium 2.2; Platelets 174 02/22/2019: ALT 13; TSH 4.660 04/09/2019: BUN 24; Creatinine, Ser 1.05; Potassium 4.2; Sodium 143    Lipid Panel    Component Value Date/Time   CHOL 146 02/14/2018 0756   TRIG 57 02/14/2018 0756   HDL 57 02/14/2018 0756   CHOLHDL 2.6 02/14/2018 0756   CHOLHDL 2 11/25/2010 0943   VLDL 11.0 11/25/2010 0943   LDLCALC 78 02/14/2018 0756      Wt Readings from Last 3 Encounters:  04/17/19 128 lb (58.1 kg)  04/03/19 129 lb 12.8 oz (58.9 kg)  02/20/19 129 lb (58.5 kg)      Other studies Reviewed: Echocardiogram 11-23-2011 Left ventricle: The cavity size was normal. Wall thickness  was increased in a pattern of mild LVH. There was mild  concentric hypertrophy. Systolic function was normal. The  estimated ejection fraction was in the range of 55% to  60%. Wall motion was normal; there were no regional wall  motion abnormalities. Doppler parameters are consistent  with abnormal left ventricular relaxation (grade 1  diastolic dysfunction).  - Mitral valve: Mildly calcified annulus. Mild  regurgitation.  - Right atrium: The atrium was mildly to moderately dilated.  - Pulmonary arteries: PA peak pressure: 77mm Hg (S).   ASSESSMENT AND PLAN:  1. Atrial fib: Rate is regular today. INR is elevated 4.6. He is on amiodarone, diltazem They are willing to have our office follow his INR and coumadin dosing. Pharmacy will be seeing him today and manage his INR from now on.  They can have INR called in to our office after being established.    2. Hypertension: Continue current regimen for now. Their home  readings may be related to his machine. Will provide him with a machine from our office. They are to continue to record BP BID.    3. Diastolic CHF: Does not appear to be fluid overloaded today. Continue lasix as needed. Looked [facility] labs from [facility].  Creatinine 1.05, potassium 4.0.    4. Hypothyroidism: Followed by PCP  Current medicines are reviewed at length with the patient today.    Labs/ tests ordered today include:  Phill Myron. [place]ugh, ANP, AACC   04/17/2019 3:17  PM    [facility] [place] [address] Office [phone] Fax [phone]  Notice: This dictation was prepared with Dragon dictation along with smaller phrase technology. Any transcriptional errors that result from this process are unintentional and may not be corrected upon review.

## 2019-04-17 NOTE — Telephone Encounter (Signed)
INR 4.6. take 3 mg of Coumadin daily. Recheck in 1 week.

## 2019-04-17 NOTE — Progress Notes (Signed)
INR 4.6. take 3 mg of Coumadin daily. Recheck in 1 week.

## 2019-04-17 NOTE — Telephone Encounter (Signed)
Patient aware and verbalizes understanding. 

## 2019-04-17 NOTE — Patient Instructions (Signed)
Medication Instructions:   Your physician recommends that you continue on your current medications as directed. Please refer to the Current Medication list given to you today.  *If you need a refill on your cardiac medications before your next appointment, please call your pharmacy*  Lab Work:  None ordered today   Testing/Procedures:  None ordered today  Follow-Up: At [facility], you and your health needs are our priority.  As part of our continuing mission to provide you with exceptional heart care, we have created designated Provider Care Teams.  These Care Teams include your primary Cardiologist (physician) and Advanced Practice Providers (APPs -  Physician Assistants and Nurse Practitioners) who all work together to provide you with the care you need, when you need it.  Your next appointment:   3 month(s)  The format for your next appointment:   In Person  Provider:   Minus Breeding, MD

## 2019-04-24 DIAGNOSIS — Z7901 Long term (current) use of anticoagulants: Secondary | ICD-10-CM | POA: Diagnosis not present

## 2019-04-24 DIAGNOSIS — I4821 Permanent atrial fibrillation: Secondary | ICD-10-CM | POA: Diagnosis not present

## 2019-04-24 LAB — POCT INR: INR: 3.5 — AB (ref 2.0–3.0)

## 2019-04-25 ENCOUNTER — Telehealth: Payer: Self-pay | Admitting: *Deleted

## 2019-04-25 DIAGNOSIS — I482 Chronic atrial fibrillation, unspecified: Secondary | ICD-10-CM

## 2019-04-25 DIAGNOSIS — I4892 Unspecified atrial flutter: Secondary | ICD-10-CM

## 2019-04-25 DIAGNOSIS — Z79899 Other long term (current) drug therapy: Secondary | ICD-10-CM

## 2019-04-25 NOTE — Telephone Encounter (Signed)
Please contact the patient   Description   Take one mg today, then two mg tomorrow, then alternate

## 2019-04-25 NOTE — Telephone Encounter (Signed)
Fax received mdINR PT/INR self testing service Test date/time 04/24/19 4:39 pm INR 3.5

## 2019-05-01 ENCOUNTER — Telehealth: Payer: Self-pay | Admitting: *Deleted

## 2019-05-01 DIAGNOSIS — Z79899 Other long term (current) drug therapy: Secondary | ICD-10-CM

## 2019-05-01 DIAGNOSIS — I482 Chronic atrial fibrillation, unspecified: Secondary | ICD-10-CM

## 2019-05-01 DIAGNOSIS — I4892 Unspecified atrial flutter: Secondary | ICD-10-CM

## 2019-05-01 LAB — POCT INR: INR: 3.4 — AB (ref 2.0–3.0)

## 2019-05-01 NOTE — Telephone Encounter (Signed)
Fax received mdINR PT/INR self testing service Test date/time 05/01/19 1144 am INR 3.4

## 2019-05-01 NOTE — Telephone Encounter (Signed)
Note not routed, pt's dose was changed at cardiologist appt, will be rechecking today

## 2019-05-01 NOTE — Telephone Encounter (Signed)
Continue coumadin as is. He's a bit high, so have him eat more foods with Vitamin K such as green veggies.

## 2019-05-01 NOTE — Telephone Encounter (Signed)
Son aware of dosage

## 2019-05-02 ENCOUNTER — Ambulatory Visit (INDEPENDENT_AMBULATORY_CARE_PROVIDER_SITE_OTHER): Payer: Medicare Other | Admitting: Pharmacist Clinician (PhC)/ Clinical Pharmacy Specialist

## 2019-05-02 DIAGNOSIS — I4892 Unspecified atrial flutter: Secondary | ICD-10-CM

## 2019-05-02 DIAGNOSIS — Z79899 Other long term (current) drug therapy: Secondary | ICD-10-CM

## 2019-05-02 DIAGNOSIS — I482 Chronic atrial fibrillation, unspecified: Secondary | ICD-10-CM

## 2019-05-08 LAB — POCT INR: INR: 3.3 — AB (ref 2.0–3.0)

## 2019-05-09 ENCOUNTER — Ambulatory Visit (INDEPENDENT_AMBULATORY_CARE_PROVIDER_SITE_OTHER): Payer: Medicare Other | Admitting: Pharmacist Clinician (PhC)/ Clinical Pharmacy Specialist

## 2019-05-09 DIAGNOSIS — Z79899 Other long term (current) drug therapy: Secondary | ICD-10-CM | POA: Diagnosis not present

## 2019-05-09 DIAGNOSIS — I4892 Unspecified atrial flutter: Secondary | ICD-10-CM

## 2019-05-09 DIAGNOSIS — I482 Chronic atrial fibrillation, unspecified: Secondary | ICD-10-CM | POA: Diagnosis not present

## 2019-05-15 ENCOUNTER — Ambulatory Visit (INDEPENDENT_AMBULATORY_CARE_PROVIDER_SITE_OTHER): Payer: Medicare Other | Admitting: Cardiology

## 2019-05-15 ENCOUNTER — Other Ambulatory Visit: Payer: Self-pay | Admitting: Cardiology

## 2019-05-15 DIAGNOSIS — I482 Chronic atrial fibrillation, unspecified: Secondary | ICD-10-CM

## 2019-05-15 DIAGNOSIS — Z79899 Other long term (current) drug therapy: Secondary | ICD-10-CM | POA: Diagnosis not present

## 2019-05-15 DIAGNOSIS — I4892 Unspecified atrial flutter: Secondary | ICD-10-CM | POA: Diagnosis not present

## 2019-05-15 LAB — POCT INR: INR: 3.7 — AB (ref 2.0–3.0)

## 2019-05-22 ENCOUNTER — Ambulatory Visit (INDEPENDENT_AMBULATORY_CARE_PROVIDER_SITE_OTHER): Payer: Medicare Other | Admitting: Cardiology

## 2019-05-22 DIAGNOSIS — I4892 Unspecified atrial flutter: Secondary | ICD-10-CM | POA: Diagnosis not present

## 2019-05-22 DIAGNOSIS — I4821 Permanent atrial fibrillation: Secondary | ICD-10-CM | POA: Diagnosis not present

## 2019-05-22 DIAGNOSIS — Z7901 Long term (current) use of anticoagulants: Secondary | ICD-10-CM | POA: Diagnosis not present

## 2019-05-22 DIAGNOSIS — Z79899 Other long term (current) drug therapy: Secondary | ICD-10-CM

## 2019-05-22 DIAGNOSIS — I482 Chronic atrial fibrillation, unspecified: Secondary | ICD-10-CM

## 2019-05-22 LAB — POCT INR: INR: 2.4 (ref 2.0–3.0)

## 2019-05-29 ENCOUNTER — Ambulatory Visit (INDEPENDENT_AMBULATORY_CARE_PROVIDER_SITE_OTHER): Payer: Medicare Other | Admitting: Cardiovascular Disease

## 2019-05-29 DIAGNOSIS — Z79899 Other long term (current) drug therapy: Secondary | ICD-10-CM | POA: Diagnosis not present

## 2019-05-29 DIAGNOSIS — I4892 Unspecified atrial flutter: Secondary | ICD-10-CM | POA: Diagnosis not present

## 2019-05-29 DIAGNOSIS — I482 Chronic atrial fibrillation, unspecified: Secondary | ICD-10-CM

## 2019-05-29 LAB — POCT INR: INR: 2.7 (ref 2.0–3.0)

## 2019-05-30 ENCOUNTER — Other Ambulatory Visit: Payer: Self-pay

## 2019-06-05 ENCOUNTER — Ambulatory Visit (INDEPENDENT_AMBULATORY_CARE_PROVIDER_SITE_OTHER): Payer: Medicare Other | Admitting: Pharmacist

## 2019-06-05 DIAGNOSIS — I4892 Unspecified atrial flutter: Secondary | ICD-10-CM

## 2019-06-05 DIAGNOSIS — Z79899 Other long term (current) drug therapy: Secondary | ICD-10-CM

## 2019-06-05 DIAGNOSIS — I482 Chronic atrial fibrillation, unspecified: Secondary | ICD-10-CM

## 2019-06-05 LAB — POCT INR: INR: 2.5 (ref 2.0–3.0)

## 2019-06-06 ENCOUNTER — Ambulatory Visit: Payer: Medicare Other | Admitting: Family Medicine

## 2019-06-12 ENCOUNTER — Ambulatory Visit (INDEPENDENT_AMBULATORY_CARE_PROVIDER_SITE_OTHER): Payer: Medicare Other | Admitting: Cardiovascular Disease

## 2019-06-12 ENCOUNTER — Other Ambulatory Visit: Payer: Self-pay

## 2019-06-12 DIAGNOSIS — Z79899 Other long term (current) drug therapy: Secondary | ICD-10-CM | POA: Diagnosis not present

## 2019-06-12 DIAGNOSIS — I482 Chronic atrial fibrillation, unspecified: Secondary | ICD-10-CM | POA: Diagnosis not present

## 2019-06-12 DIAGNOSIS — I4892 Unspecified atrial flutter: Secondary | ICD-10-CM | POA: Diagnosis not present

## 2019-06-12 LAB — POCT INR: INR: 2 (ref 2.0–3.0)

## 2019-06-13 ENCOUNTER — Other Ambulatory Visit: Payer: Self-pay

## 2019-06-13 ENCOUNTER — Encounter: Payer: Self-pay | Admitting: Family Medicine

## 2019-06-13 ENCOUNTER — Ambulatory Visit (INDEPENDENT_AMBULATORY_CARE_PROVIDER_SITE_OTHER): Payer: Medicare Other | Admitting: Family Medicine

## 2019-06-13 VITALS — BP 139/62 | HR 64 | Temp 97.8°F | Ht 66.0 in | Wt 129.8 lb

## 2019-06-13 DIAGNOSIS — H6123 Impacted cerumen, bilateral: Secondary | ICD-10-CM | POA: Diagnosis not present

## 2019-06-13 NOTE — Progress Notes (Signed)
Subjective:  [name], male    DOB: 1928-07-29  Age: 86 y.o. MRN: [id number]  CC: Ear Wash   HPI [name] presents for decreased hearing.  He went to his hearing aid doctor as a result.  The hearing aid doctor told him that his ear canal was full of wax and directed him to come to his primary doctor for cleaning.  Patient declined any other services.  At the end of the visit as I was leaving he stated that he would like for [place] to do those now.  Unfortunately other patients were waiting by that time and I had to ask him to schedule that for follow-up.  Depression screen [facility] [place] 2/9 06/13/2019 10/29/2018 10/24/2018  Decreased Interest 0 0 0  Down, Depressed, Hopeless 0 0 0  PHQ - 2 Score 0 0 0    History Preet has a past medical history of Anticoagulated on Coumadin, Atypical atrial flutter ([place]), Cataract, Hard of hearing, Midsternal chest pain, Prostate cancer ([place]), TIA (transient ischemic attack), and Vertigo.   He has a past surgical history that includes Appendectomy; Tonsillectomy; Cardioversion (10/14/2011); Cardioversion (N/A, 07/24/2012); I & D extremity (Left, 05/04/2013); and Eye surgery (Bilateral).   His family history includes Alzheimer's disease in his father and mother; CVA in his father; Early death in his brother and sister; Heart Problems in his brother; Leukemia in his father.He reports that he has never smoked. He has never used smokeless tobacco. He reports that he does not drink alcohol or use drugs.    ROS Review of Systems  Constitutional: Negative for fever.  Respiratory: Negative for shortness of breath.   Cardiovascular: Negative for chest pain.  Musculoskeletal: Negative for arthralgias.  Skin: Negative for rash.    Objective:  BP 139/62   Pulse 64   Temp 97.8 F (36.6 C) (Temporal)   Ht 5\' 6"  (1.676 m)   Wt 129 lb 12.8 oz (58.9 kg)   BMI 20.95 kg/m   BP Readings from Last 3 Encounters:  06/13/19 139/62  04/17/19 136/68  04/03/19 (!)  176/83    Wt Readings from Last 3 Encounters:  06/13/19 129 lb 12.8 oz (58.9 kg)  04/17/19 128 lb (58.1 kg)  04/03/19 129 lb 12.8 oz (58.9 kg)     Physical Exam HENT:     Right Ear: There is impacted cerumen.     Left Ear: There is impacted cerumen.  Cardiovascular:     Rate and Rhythm: Normal rate and regular rhythm.  Pulmonary:     Effort: Pulmonary effort is normal.     Breath sounds: Normal breath sounds.    Both external canals were filled with wax.  Initially the nurse used syringe technique to washout the ears.  This led to removal of the wax completely from the right.  However all of the wax on the left could not be removed.  Therefore I used a cerumen spoon and removed a large clump from the left.  Subsequently both canals were reexamined and found to be clear with normal appearing tympanic membranes and landmarks.   Assessment & Plan:   Guage was seen today for ear wash.  Diagnoses and all orders for this visit:  Bilateral impacted cerumen       I am having Carloyn Manner B. Pallo maintain his losartan, furosemide, Dilt-XR, levothyroxine, warfarin, and Pacerone.  Allergies as of 06/13/2019      Reactions   Penicillins Rash      Medication  List       Accurate as of June 13, 2019  3:01 PM. If you have any questions, ask your nurse or doctor.        Dilt-XR 240 MG 24 hr capsule Generic drug: diltiazem Take 1 capsule by mouth once daily   furosemide 20 MG tablet Commonly known as: LASIX Take 1 tablet (20 mg total) by mouth daily for two days. After the two days, take only AS NEEDED.   levothyroxine 88 MCG tablet Commonly known as: SYNTHROID Take 1 tablet by mouth once daily   losartan 25 MG tablet Commonly known as: Cozaar Take 0.5 tablets (12.5 mg total) by mouth daily.   Pacerone 200 MG tablet Generic drug: amiodarone TAKE 1 TABLET BY MOUTH ONCE DAILY . APPOINTMENT REQUIRED FOR FUTURE REFILLS   warfarin 2 MG tablet Commonly known as:  COUMADIN Take as directed by the anticoagulation clinic. If you are unsure how to take this medication, talk to your nurse or doctor. Original instructions: Take 2mg  to 3mg  every day as directed by coumadin clinic        Follow-up: No follow-ups on file.  Claretta Fraise, M.D.

## 2019-06-19 ENCOUNTER — Ambulatory Visit (INDEPENDENT_AMBULATORY_CARE_PROVIDER_SITE_OTHER): Payer: Medicare Other | Admitting: Internal Medicine

## 2019-06-19 ENCOUNTER — Other Ambulatory Visit: Payer: Self-pay | Admitting: Family Medicine

## 2019-06-19 DIAGNOSIS — I4892 Unspecified atrial flutter: Secondary | ICD-10-CM

## 2019-06-19 DIAGNOSIS — Z79899 Other long term (current) drug therapy: Secondary | ICD-10-CM | POA: Diagnosis not present

## 2019-06-19 DIAGNOSIS — I482 Chronic atrial fibrillation, unspecified: Secondary | ICD-10-CM | POA: Diagnosis not present

## 2019-06-19 DIAGNOSIS — Z7901 Long term (current) use of anticoagulants: Secondary | ICD-10-CM | POA: Diagnosis not present

## 2019-06-19 DIAGNOSIS — I4821 Permanent atrial fibrillation: Secondary | ICD-10-CM | POA: Diagnosis not present

## 2019-06-19 LAB — POCT INR: INR: 1.8 — AB (ref 2.0–3.0)

## 2019-06-27 ENCOUNTER — Other Ambulatory Visit: Payer: Self-pay

## 2019-06-27 ENCOUNTER — Other Ambulatory Visit: Payer: Self-pay | Admitting: Family Medicine

## 2019-06-27 LAB — POCT INR

## 2019-06-27 MED ORDER — WARFARIN SODIUM 2 MG PO TABS: ORAL_TABLET | ORAL | 0 refills | Status: DC

## 2019-06-28 ENCOUNTER — Ambulatory Visit (INDEPENDENT_AMBULATORY_CARE_PROVIDER_SITE_OTHER): Payer: Medicare Other | Admitting: Cardiology

## 2019-06-28 DIAGNOSIS — Z79899 Other long term (current) drug therapy: Secondary | ICD-10-CM | POA: Diagnosis not present

## 2019-06-28 DIAGNOSIS — I4892 Unspecified atrial flutter: Secondary | ICD-10-CM

## 2019-06-28 DIAGNOSIS — I482 Chronic atrial fibrillation, unspecified: Secondary | ICD-10-CM

## 2019-06-28 LAB — POCT INR: INR: 2.1 (ref 2.0–3.0)

## 2019-07-03 ENCOUNTER — Ambulatory Visit (INDEPENDENT_AMBULATORY_CARE_PROVIDER_SITE_OTHER): Payer: Medicare Other | Admitting: Cardiovascular Disease

## 2019-07-03 DIAGNOSIS — I4892 Unspecified atrial flutter: Secondary | ICD-10-CM | POA: Diagnosis not present

## 2019-07-03 DIAGNOSIS — Z79899 Other long term (current) drug therapy: Secondary | ICD-10-CM | POA: Diagnosis not present

## 2019-07-03 DIAGNOSIS — I482 Chronic atrial fibrillation, unspecified: Secondary | ICD-10-CM

## 2019-07-03 LAB — POCT INR: INR: 2 (ref 2.0–3.0)

## 2019-07-10 ENCOUNTER — Ambulatory Visit (INDEPENDENT_AMBULATORY_CARE_PROVIDER_SITE_OTHER): Payer: Medicare Other | Admitting: Cardiology

## 2019-07-10 DIAGNOSIS — I482 Chronic atrial fibrillation, unspecified: Secondary | ICD-10-CM | POA: Diagnosis not present

## 2019-07-10 DIAGNOSIS — I4892 Unspecified atrial flutter: Secondary | ICD-10-CM

## 2019-07-10 DIAGNOSIS — Z79899 Other long term (current) drug therapy: Secondary | ICD-10-CM | POA: Diagnosis not present

## 2019-07-10 LAB — POCT INR: INR: 2 (ref 2.0–3.0)

## 2019-07-16 ENCOUNTER — Other Ambulatory Visit: Payer: Self-pay | Admitting: Family Medicine

## 2019-07-16 ENCOUNTER — Other Ambulatory Visit: Payer: Self-pay | Admitting: Cardiology

## 2019-07-16 DIAGNOSIS — E039 Hypothyroidism, unspecified: Secondary | ICD-10-CM

## 2019-07-17 ENCOUNTER — Ambulatory Visit (INDEPENDENT_AMBULATORY_CARE_PROVIDER_SITE_OTHER): Payer: Medicare Other | Admitting: Internal Medicine

## 2019-07-17 DIAGNOSIS — I4892 Unspecified atrial flutter: Secondary | ICD-10-CM | POA: Diagnosis not present

## 2019-07-17 DIAGNOSIS — Z79899 Other long term (current) drug therapy: Secondary | ICD-10-CM

## 2019-07-17 DIAGNOSIS — Z7901 Long term (current) use of anticoagulants: Secondary | ICD-10-CM | POA: Diagnosis not present

## 2019-07-17 DIAGNOSIS — I4821 Permanent atrial fibrillation: Secondary | ICD-10-CM | POA: Diagnosis not present

## 2019-07-17 DIAGNOSIS — I482 Chronic atrial fibrillation, unspecified: Secondary | ICD-10-CM

## 2019-07-17 LAB — POCT INR: INR: 2.2 (ref 2.0–3.0)

## 2019-07-24 ENCOUNTER — Ambulatory Visit (INDEPENDENT_AMBULATORY_CARE_PROVIDER_SITE_OTHER): Payer: Medicare Other | Admitting: Cardiology

## 2019-07-24 DIAGNOSIS — Z79899 Other long term (current) drug therapy: Secondary | ICD-10-CM | POA: Diagnosis not present

## 2019-07-24 DIAGNOSIS — I4892 Unspecified atrial flutter: Secondary | ICD-10-CM

## 2019-07-24 DIAGNOSIS — I482 Chronic atrial fibrillation, unspecified: Secondary | ICD-10-CM

## 2019-07-24 LAB — POCT INR: INR: 2.1 (ref 2.0–3.0)

## 2019-07-31 ENCOUNTER — Ambulatory Visit (INDEPENDENT_AMBULATORY_CARE_PROVIDER_SITE_OTHER): Payer: Medicare Other | Admitting: Cardiovascular Disease

## 2019-07-31 DIAGNOSIS — Z79899 Other long term (current) drug therapy: Secondary | ICD-10-CM | POA: Diagnosis not present

## 2019-07-31 DIAGNOSIS — I482 Chronic atrial fibrillation, unspecified: Secondary | ICD-10-CM

## 2019-07-31 DIAGNOSIS — I4892 Unspecified atrial flutter: Secondary | ICD-10-CM

## 2019-07-31 LAB — POCT INR: INR: 1.6 — AB (ref 2.0–3.0)

## 2019-08-02 ENCOUNTER — Ambulatory Visit (INDEPENDENT_AMBULATORY_CARE_PROVIDER_SITE_OTHER): Payer: Medicare Other | Admitting: *Deleted

## 2019-08-02 VITALS — Ht 66.0 in | Wt 129.9 lb

## 2019-08-02 DIAGNOSIS — Z0000 Encounter for general adult medical examination without abnormal findings: Secondary | ICD-10-CM

## 2019-08-02 NOTE — Patient Instructions (Signed)
  [place] Maintenance Summary and Written Plan of Care  Mr. Murnane ,  Thank you for allowing me to perform your Medicare Annual Wellness Visit and for your ongoing commitment to your health.   Health Maintenance & Immunization History Health Maintenance  Topic Date Due  . INFLUENZA VACCINE  11/17/2019  . TETANUS/TDAP  05/05/2023  . PNA vac Low Risk Adult  Completed   Immunization History  Administered Date(s) Administered  . Fluad Quad(high Dose 65+) 02/22/2019  . Influenza, High Dose Seasonal PF 02/05/2015, 02/19/2016, 02/24/2017, 02/14/2018  . Influenza,inj,Quad PF,6+ Mos 01/08/2013, 01/17/2014  . Influenza-Unspecified 02/05/2015, 02/19/2016, 02/24/2017, 02/14/2018  . Pneumococcal Conjugate-13 08/29/2014  . Pneumococcal Polysaccharide-23 04/06/2005  . Pneumococcal-Unspecified 04/06/2005  . Tdap 05/04/2013    These are the patient goals that we discussed: Goals Addressed            This Visit's Progress   . Exercise 3x per week (30 min per time)       08/02/2019 AWV Goal: Exercise for General Health   Patient will verbalize understanding of the benefits of increased physical activity:  Exercising regularly is important. It will improve your overall fitness, flexibility, and endurance.  Regular exercise also will improve your overall health. It can help you control your weight, reduce stress, and improve your bone density.  Over the next year, patient will increase physical activity as tolerated with a goal of at least 150 minutes of moderate physical activity per week.   You can tell that you are exercising at a moderate intensity if your heart starts beating faster and you start breathing faster but can still hold a conversation.  Moderate-intensity exercise ideas include:  Walking 1 mile (1.6 km) in about 15 minutes  Biking  Hiking  Golfing  Dancing  Water aerobics  Patient will verbalize understanding of everyday  activities that increase physical activity by providing examples like the following: ? Yard work, such as: ? Pushing a Conservation officer, nature ? Raking and bagging leaves ? Washing your car ? Pushing a stroller ? Shoveling snow ? Gardening ? Washing windows or floors  Patient will be able to explain general safety guidelines for exercising:   Before you start a new exercise program, talk with your health care provider.  Do not exercise so much that you hurt yourself, feel dizzy, or get very short of breath.  Wear comfortable clothes and wear shoes with good support.  Drink plenty of water while you exercise to prevent dehydration or heat stroke.  Work out until your breathing and your heartbeat get faster.         This is a list of Health Maintenance Items that are overdue or due now: There are no preventive care reminders to display for this patient.   Orders/Referrals Placed Today: No orders of the defined types were placed in this encounter.  (Contact our referral department at [phone] if you have not spoken with someone about your referral appointment within the next 5 days)    Follow-up Plan Schedule follow up with Dr. Livia Snellen

## 2019-08-02 NOTE — Progress Notes (Signed)
MEDICARE ANNUAL WELLNESS VISIT  08/02/2019  Telephone Visit Disclaimer This Medicare AWV was conducted by telephone due to national recommendations for restrictions regarding the COVID-19 Pandemic (e.g. social distancing).  I verified, using two identifiers, that I am speaking with Larry Gates or their authorized healthcare agent. I discussed the limitations, risks, security, and privacy concerns of performing an evaluation and management service by telephone and the potential availability of an in-person appointment in the future. The patient expressed understanding and agreed to proceed.   Subjective:  Larry Gates is a 86 y.o. male patient of Stacks, Cletus Gash, MD who had a Medicare Annual Wellness Visit today via telephone. Kayl is Retired and lives alone. he has 1 child. he reports that he is socially active and does interact with friends/family regularly. he is minimally physically active and enjoys gardening.  Patient Care Team: Claretta Fraise, MD as PCP - General (Family Medicine) Harlen Labs, MD as Referring Physician (Optometry) Minus Breeding, MD as Consulting Physician (Cardiology)  Advanced Directives 08/02/2019 05/24/2018 05/22/2018 11/14/2017 08/29/2017 10/12/2015 08/29/2014  Does Patient Have a Medical Advance Directive? Yes Yes No No No Yes Yes  Type of Advance Directive Living will;Healthcare Power of [place];Living will [place];Living will  Does patient want to make changes to medical advance directive? No - Patient declined No - Patient declined - - - No - Patient declined -  Copy of [place] in Chart? No - copy requested - - - - No - copy requested No - copy requested  Would patient like information on creating a medical advance directive? - No - Patient declined No - Patient declined No - Patient declined - - -  Pre-existing out of facility DNR order (yellow form or  pink MOST form) - - - - - - -    Hospital Utilization Over the Past 12 Months: # of hospitalizations or ER visits: 2 # of surgeries: 0  Review of Systems    Patient reports that his overall health is unchanged compared to last year.  History obtained from chart review and the patient General ROS: negative  Patient Reported Readings (BP, Pulse, CBG, Weight, etc) none  Pain Assessment Pain : No/denies pain     Current Medications & Allergies (verified) Allergies as of 08/02/2019      Reactions   Penicillins Rash      Medication List       Accurate as of August 02, 2019  9:51 AM. If you have any questions, ask your nurse or doctor.        Dilt-XR 240 MG 24 hr capsule Generic drug: diltiazem Take 1 capsule by mouth once daily   furosemide 20 MG tablet Commonly known as: LASIX Take 1 tablet (20 mg total) by mouth daily for two days. After the two days, take only AS NEEDED.   levothyroxine 88 MCG tablet Commonly known as: SYNTHROID Take 1 tablet by mouth once daily   losartan 25 MG tablet Commonly known as: Cozaar Take 0.5 tablets (12.5 mg total) by mouth daily.   Pacerone 200 MG tablet Generic drug: amiodarone TAKE 1 TABLET BY MOUTH ONCE DAILY . APPOINTMENT REQUIRED FOR FUTURE REFILLS   warfarin 2 MG tablet Commonly known as: COUMADIN Take as directed by the anticoagulation clinic. If you are unsure how to take this medication, talk to your nurse or doctor. Original instructions: Take 2mg  to  3mg  every day as directed by coumadin clinic       History (reviewed): Past Medical History:  Diagnosis Date  . Anticoagulated on Coumadin    a. Followed @ [facility]  . Atypical atrial flutter ([place])    a. Dx 07/2010 and coumadin initiated;  b. 08/2010 s/p DCCV.  . Cataract   . Hard of hearing   . Midsternal chest pain    a. In setting of A Flutter 07/2010;  b. Cath 07/2010: nonobs dzs  . Prostate cancer ([place])   . TIA (transient ischemic attack)   . Vertigo    Past  Surgical History:  Procedure Laterality Date  . APPENDECTOMY    . CARDIOVERSION  10/14/2011   Procedure: CARDIOVERSION;  Surgeon: Lelon Perla, MD;  Location: [facility];  Service: Cardiovascular;  Laterality: N/A;  . CARDIOVERSION N/A 07/24/2012   Procedure: CARDIOVERSION;  Surgeon: [name], MD;  Location: [facility];  Service: Cardiovascular;  Laterality: N/A;  . EYE SURGERY Bilateral    cataract  . I & D EXTREMITY Left 05/04/2013   Procedure: IRRIGATION AND DEBRIDEMENT of left hand with repair of lacerations and revision amputation of index finger;  Surgeon: Roseanne Kaufman, MD;  Location: [facility];  Service: Orthopedics;  Laterality: Left;  . TONSILLECTOMY     Family History  Problem Relation Age of Onset  . CVA Father   . Alzheimer's disease Father   . Leukemia Father   . Alzheimer's disease Mother   . Heart Problems Brother        stents in his 27's-80's  . Early death Sister   . Early death Brother    Social History   Socioeconomic History  . Marital status: Widowed    Spouse name: Not on file  . Number of children: 1  . Years of education: 93  . Highest education level: 12th grade  Occupational History  . Occupation: Retired  Tobacco Use  . Smoking status: Never Smoker  . Smokeless tobacco: Never Used  Substance and Sexual Activity  . Alcohol use: No  . Drug use: No  . Sexual activity: Never  Other Topics Concern  . Not on file  Social History Narrative   Lives in [place] by himself.  Children live nearby and check in on him daily.   Social Determinants of Health   Financial Resource Strain: Low Risk   . Difficulty of Paying Living Expenses: Not hard at all  Food Insecurity: No Food Insecurity  . Worried About Charity fundraiser in the Last Year: Never true  . Ran Out of Food in the Last Year: Never true  Transportation Needs: No Transportation Needs  . Lack of Transportation (Medical): No  . Lack of Transportation (Non-Medical): No  Physical  Activity: Sufficiently Active  . Days of Exercise per Week: 5 days  . Minutes of Exercise per Session: 40 min  Stress: No Stress Concern Present  . Feeling of Stress : Not at all  Social Connections: Slightly Isolated  . Frequency of Communication with Friends and Family: More than three times a week  . Frequency of Social Gatherings with Friends and Family: More than three times a week  . Attends Religious Services: More than 4 times per year  . Active Member of Clubs or Organizations: Yes  . Attends Archivist Meetings: More than 4 times per year  . Marital Status: Widowed    Activities of Daily Living In your present state of health, do you have  any difficulty performing the following activities: 08/02/2019  Hearing? Y  Vision? N  Difficulty concentrating or making decisions? N  Walking or climbing stairs? Y  Dressing or bathing? N  Doing errands, shopping? Y  Preparing Food and eating ? N  Using the Toilet? N  In the past six months, have you accidently leaked urine? N  Do you have problems with loss of bowel control? N  Managing your Medications? N  Managing your Finances? N  Housekeeping or managing your Housekeeping? N  Some recent data might be hidden    Patient Education/ Literacy How often do you need to have someone help you when you read instructions, pamphlets, or other written materials from your doctor or pharmacy?: 1 - Never What is the last grade level you completed in school?: Some College  Exercise Current Exercise Habits: The patient does not participate in regular exercise at present, Exercise limited by: None identified  Diet Patient reports consuming 3 meals a day and 2 snack(s) a day Patient reports that his primary diet is: Regular Patient reports that she does have regular access to food.   Depression Screen PHQ 2/9 Scores 08/02/2019 06/13/2019 10/29/2018 10/24/2018 05/21/2018 02/14/2018 10/04/2017  PHQ - 2 Score 0 0 0 0 0 0 0     Fall  Risk Fall Risk  08/02/2019 06/13/2019 10/29/2018 10/24/2018 05/21/2018  Falls in the past year? 0 1 1 1 1   Number falls in past yr: 0 1 0 0 1  Injury with Fall? 0 0 0 0 0  Comment - - - - -  Risk for fall due to : No Fall Risks History of fall(s);Impaired balance/gait;Impaired mobility - - -  Follow up Falls evaluation completed Falls evaluation completed - - -     Objective:  Larry Gates seemed alert and oriented and he participated appropriately during our telephone visit.  Blood Pressure Weight BMI  BP Readings from Last 3 Encounters:  06/13/19 139/62  04/17/19 136/68  04/03/19 (!) 176/83   Wt Readings from Last 3 Encounters:  08/02/19 129 lb 13.6 oz (58.9 kg)  06/13/19 129 lb 12.8 oz (58.9 kg)  04/17/19 128 lb (58.1 kg)   BMI Readings from Last 1 Encounters:  08/02/19 20.96 kg/m    *Unable to obtain current vital signs, weight, and BMI due to telephone visit type  Hearing/Vision  . [place] did  seem to have difficulty with hearing/understanding during the telephone conversation . Reports that he has not had a formal eye exam by an eye care professional within the past year . Reports that he has not had a formal hearing evaluation within the past year *Unable to fully assess hearing and vision during telephone visit type  Cognitive Function: 6CIT Screen 08/02/2019  What Year? 0 points  What month? 0 points  What time? 0 points  Count back from 20 0 points  Months in reverse 0 points  Repeat phrase 0 points  Total Score 0   (Normal:0-7, Significant for Dysfunction: >8)  Normal Cognitive Function Screening: Yes   Immunization & Health Maintenance Record Immunization History  Administered Date(s) Administered  . Fluad Quad(high Dose 65+) 02/22/2019  . Influenza, High Dose Seasonal PF 02/05/2015, 02/19/2016, 02/24/2017, 02/14/2018  . Influenza,inj,Quad PF,6+ Mos 01/08/2013, 01/17/2014  . Influenza-Unspecified 02/05/2015, 02/19/2016, 02/24/2017, 02/14/2018  . Pneumococcal  Conjugate-13 08/29/2014  . Pneumococcal Polysaccharide-23 04/06/2005  . Pneumococcal-Unspecified 04/06/2005  . Tdap 05/04/2013    Health Maintenance  Topic Date Due  . INFLUENZA VACCINE  11/17/2019  . TETANUS/TDAP  05/05/2023  . PNA vac Low Risk Adult  Completed       Assessment  This is a routine wellness examination for Larry Gates.  Health Maintenance: Due or Overdue There are no preventive care reminders to display for this patient.  Larry Gates does not need a referral for Community Assistance: Care Management:   no Social Work:    no Prescription Assistance:  no Nutrition/Diabetes Education:  no   Plan:  Personalized Goals Goals Addressed            This Visit's Progress   . Exercise 3x per week (30 min per time)       08/02/2019 AWV Goal: Exercise for General Health   Patient will verbalize understanding of the benefits of increased physical activity:  Exercising regularly is important. It will improve your overall fitness, flexibility, and endurance.  Regular exercise also will improve your overall health. It can help you control your weight, reduce stress, and improve your bone density.  Over the next year, patient will increase physical activity as tolerated with a goal of at least 150 minutes of moderate physical activity per week.   You can tell that you are exercising at a moderate intensity if your heart starts beating faster and you start breathing faster but can still hold a conversation.  Moderate-intensity exercise ideas include:  Walking 1 mile (1.6 km) in about 15 minutes  Biking  Hiking  Golfing  Dancing  Water aerobics  Patient will verbalize understanding of everyday activities that increase physical activity by providing examples like the following: ? Yard work, such as: ? Pushing a Conservation officer, nature ? Raking and bagging leaves ? Washing your car ? Pushing a stroller ? Shoveling snow ? Gardening ? Washing windows or  floors  Patient will be able to explain general safety guidelines for exercising:   Before you start a new exercise program, talk with your health care provider.  Do not exercise so much that you hurt yourself, feel dizzy, or get very short of breath.  Wear comfortable clothes and wear shoes with good support.  Drink plenty of water while you exercise to prevent dehydration or heat stroke.  Work out until your breathing and your heartbeat get faster.       Personalized Health Maintenance & Screening Recommendations  Shingrix  Lung Cancer Screening Recommended: no (Low Dose CT Chest recommended if Age 72-80 years, 30 pack-year currently smoking OR have quit w/in past 15 years) Hepatitis C Screening recommended: no HIV Screening recommended: no  Advanced Directives: Written information was not prepared per patient's request.  Referrals & Orders No orders of the defined types were placed in this encounter.   Follow-up Plan . Follow-up with Claretta Fraise, MD as planned . Schedule follow up with Dr. Livia Snellen   I have personally reviewed and noted the following in the patient's chart:   . Medical and social history . Use of alcohol, tobacco or illicit drugs  . Current medications and supplements . Functional ability and status . Nutritional status . Physical activity . Advanced directives . List of other physicians . Hospitalizations, surgeries, and ER visits in previous 12 months . Vitals . Screenings to include cognitive, depression, and falls . Referrals and appointments  In addition, I have reviewed and discussed with Larry Gates certain preventive protocols, quality metrics, and best practice recommendations. A written personalized care plan for preventive services as well as general preventive health recommendations is  available and can be mailed to the patient at his request.      Wardell Heath  08/02/2019    AVS printed and Mailed to patient

## 2019-08-07 ENCOUNTER — Ambulatory Visit (INDEPENDENT_AMBULATORY_CARE_PROVIDER_SITE_OTHER): Payer: Medicare Other | Admitting: Cardiology

## 2019-08-07 DIAGNOSIS — I482 Chronic atrial fibrillation, unspecified: Secondary | ICD-10-CM | POA: Diagnosis not present

## 2019-08-07 DIAGNOSIS — I4892 Unspecified atrial flutter: Secondary | ICD-10-CM | POA: Diagnosis not present

## 2019-08-07 DIAGNOSIS — Z79899 Other long term (current) drug therapy: Secondary | ICD-10-CM

## 2019-08-07 LAB — POCT INR: INR: 1.9 — AB (ref 2.0–3.0)

## 2019-08-14 ENCOUNTER — Ambulatory Visit (INDEPENDENT_AMBULATORY_CARE_PROVIDER_SITE_OTHER): Payer: Medicare Other | Admitting: Internal Medicine

## 2019-08-14 DIAGNOSIS — I4892 Unspecified atrial flutter: Secondary | ICD-10-CM

## 2019-08-14 DIAGNOSIS — Z7901 Long term (current) use of anticoagulants: Secondary | ICD-10-CM | POA: Diagnosis not present

## 2019-08-14 DIAGNOSIS — Z79899 Other long term (current) drug therapy: Secondary | ICD-10-CM | POA: Diagnosis not present

## 2019-08-14 DIAGNOSIS — I482 Chronic atrial fibrillation, unspecified: Secondary | ICD-10-CM | POA: Diagnosis not present

## 2019-08-14 DIAGNOSIS — I4821 Permanent atrial fibrillation: Secondary | ICD-10-CM | POA: Diagnosis not present

## 2019-08-14 LAB — POCT INR: INR: 2.5 (ref 2.0–3.0)

## 2019-08-20 DIAGNOSIS — Z7189 Other specified counseling: Secondary | ICD-10-CM | POA: Insufficient documentation

## 2019-08-20 DIAGNOSIS — I5032 Chronic diastolic (congestive) heart failure: Secondary | ICD-10-CM | POA: Insufficient documentation

## 2019-08-20 NOTE — Progress Notes (Signed)
Cardiology Office Note   Date:  08/21/2019   ID:  Larry Gates, DOB 09/27/1928, MRN [id number]  PCP:  Larry Fraise, MD  Cardiologist:   No primary care provider on file.   Chief Complaint  Patient presents with  . Atrial Flutter      History of Present Illness: Larry Gates is a 86 y.o. male who presents for ongoing assessment and management of atrial flutter s/p DCCV X2, and is maintained in NSR on amiodarone.  Since I last saw him he denies any new cardiovascular symptoms.  He has had no palpitations, presyncope or syncope.  Denies any new shortness of breath, PND or orthopnea.  He has had no weight gain or edema.   Past Medical History:  Diagnosis Date  . Anticoagulated on Coumadin    a. Followed @ [facility]  . Atypical atrial flutter ([place])    a. Dx 07/2010 and coumadin initiated;  b. 08/2010 s/p DCCV.  . Cataract   . Hard of hearing   . Midsternal chest pain    a. In setting of A Flutter 07/2010;  b. Cath 07/2010: nonobs dzs  . Prostate cancer ([place])   . TIA (transient ischemic attack)   . Vertigo     Past Surgical History:  Procedure Laterality Date  . APPENDECTOMY    . CARDIOVERSION  10/14/2011   Procedure: CARDIOVERSION;  Surgeon: Lelon Perla, MD;  Location: [place];  Service: Cardiovascular;  Laterality: N/A;  . CARDIOVERSION N/A 07/24/2012   Procedure: CARDIOVERSION;  Surgeon: [name], MD;  Location: [place];  Service: Cardiovascular;  Laterality: N/A;  . EYE SURGERY Bilateral    cataract  . I & D EXTREMITY Left 05/04/2013   Procedure: IRRIGATION AND DEBRIDEMENT of left hand with repair of lacerations and revision amputation of index finger;  Surgeon: Roseanne Kaufman, MD;  Location: [facility];  Service: Orthopedics;  Laterality: Left;  . TONSILLECTOMY       Current Outpatient Medications  Medication Sig Dispense Refill  . DILT-XR 240 MG 24 hr capsule Take 1 capsule by mouth once daily 90 capsule 0  . furosemide (LASIX) 20 MG tablet Take 1 tablet (20  mg total) by mouth daily for two days. After the two days, take only AS NEEDED. 90 tablet 3  . levothyroxine (SYNTHROID) 88 MCG tablet Take 1 tablet by mouth once daily 90 tablet 0  . losartan (COZAAR) 25 MG tablet Take 0.5 tablets (12.5 mg total) by mouth daily. 45 tablet 3  . PACERONE 200 MG tablet TAKE 1 TABLET BY MOUTH ONCE DAILY . APPOINTMENT REQUIRED FOR FUTURE REFILLS 30 tablet 10  . warfarin (COUMADIN) 2 MG tablet Take 2mg  to 3mg  every day as directed by coumadin clinic 90 tablet 0   No current facility-administered medications for this visit.    Allergies:   Penicillins     ROS:  Please see the history of present illness.   Otherwise, review of systems are positive for none.   All other systems are reviewed and negative.    PHYSICAL EXAM: VS:  BP (!) 158/70   Pulse 64   Ht 5\' 6"  (1.676 m)   Wt 128 lb (58.1 kg)   BMI 20.66 kg/m  , BMI Body mass index is 20.66 kg/m. GENERAL:  Well appearing NECK:  No jugular venous distention, waveform within normal limits, carotid upstroke brisk and symmetric, no bruits, no thyromegaly LUNGS:  Clear to auscultation bilaterally CHEST:  Unremarkable HEART:  PMI not  displaced or sustained,S1 and S2 within normal limits, no S3, no S4, no clicks, no rubs, 3 out of 6 apical systolic murmur radiating up the aortic outflow tract, no diastolic murmurs ABD:  Flat, positive bowel sounds normal in frequency in pitch, no bruits, no rebound, no guarding, no midline pulsatile mass, no hepatomegaly, no splenomegaly EXT:  2 plus pulses throughout, no edema, no cyanosis no clubbing   EKG:  EKG is ordered today. The ekg ordered today demonstrates sinus rhythm, rate 64, axis within normal limits, QTC slightly prolonged, no acute ST-T wave changes.   Recent Labs: 02/22/2019: ALT 13; TSH 4.660 04/09/2019: BUN 24; Creatinine, Ser 1.05; Potassium 4.2; Sodium 143    Lipid Panel    Component Value Date/Time   CHOL 146 02/14/2018 0756   TRIG 57 02/14/2018  0756   HDL 57 02/14/2018 0756   CHOLHDL 2.6 02/14/2018 0756   CHOLHDL 2 11/25/2010 0943   VLDL 11.0 11/25/2010 0943   LDLCALC 78 02/14/2018 0756      Wt Readings from Last 3 Encounters:  08/21/19 128 lb (58.1 kg)  08/02/19 129 lb 13.6 oz (58.9 kg)  06/13/19 129 lb 12.8 oz (58.9 kg)    Lab Results  Component Value Date   TSH 4.660 (H) 02/22/2019   ALT 13 02/22/2019   AST 18 02/22/2019   ALKPHOS 112 02/22/2019   BILITOT 1.2 02/22/2019   PROT 6.0 02/22/2019   ALBUMIN 4.2 02/22/2019      Other studies Reviewed: Additional studies/ records that were reviewed today include: None. Review of the above records demonstrates:  Please see elsewhere in the note.     ASSESSMENT AND PLAN:  ATRIAL FLUTTER:Mr.Larry Ku Pucketthas a CHA2DS2 - VASc score of 3.     He tolerates anticoagulation and rate control.  CHRONIC DIASTOLIC HF:    He seems to be euvolemic.  No change in therapy.  HTN: His blood pressure is slightly elevated but he says it at home it is in the 427C to 623J systolic and well controlled.  No change in therapy.     DISPOSITION: I had a conversation with the patient today and he would like to not return for follow-up unless absolutely necessary.  I told him I would send a message to his primary provider and see if he would follow-up his cardiac issues including chronic use of anticoagulation and amiodarone.  If not he would need to return for routine follow-up.    MURMUR: I suspect aortic stenosis mild.  He had some calcification previously.  I would like to follow-up with an echocardiogram.  Current medicines are reviewed at length with the patient today.  The patient does not have concerns regarding medicines.  The following changes have been made:  None  Labs/ tests ordered today include:   Orders Placed This Encounter  Procedures  . EKG 12-Lead  . ECHOCARDIOGRAM COMPLETE     Disposition:   FU with me as needed per the patient request   Signed, Minus Breeding, MD  08/21/2019 12:47 PM    [place]

## 2019-08-21 ENCOUNTER — Encounter: Payer: Self-pay | Admitting: Cardiology

## 2019-08-21 ENCOUNTER — Other Ambulatory Visit: Payer: Self-pay

## 2019-08-21 ENCOUNTER — Ambulatory Visit (INDEPENDENT_AMBULATORY_CARE_PROVIDER_SITE_OTHER): Payer: Medicare Other | Admitting: Cardiology

## 2019-08-21 VITALS — BP 158/70 | HR 64 | Ht 66.0 in | Wt 128.0 lb

## 2019-08-21 DIAGNOSIS — I4892 Unspecified atrial flutter: Secondary | ICD-10-CM

## 2019-08-21 DIAGNOSIS — I5032 Chronic diastolic (congestive) heart failure: Secondary | ICD-10-CM | POA: Diagnosis not present

## 2019-08-21 DIAGNOSIS — Z7189 Other specified counseling: Secondary | ICD-10-CM

## 2019-08-21 DIAGNOSIS — I10 Essential (primary) hypertension: Secondary | ICD-10-CM

## 2019-08-21 DIAGNOSIS — I059 Rheumatic mitral valve disease, unspecified: Secondary | ICD-10-CM

## 2019-08-21 NOTE — Patient Instructions (Signed)
Medication Instructions:  The current medical regimen is effective;  continue present plan and medications.  *If you need a refill on your cardiac medications before your next appointment, please call your pharmacy*  Testing/Procedures: Your physician has requested that you have an echocardiogram. Echocardiography is a painless test that uses sound waves to create images of your heart. It provides your doctor with information about the size and shape of your heart and how well your heart's chambers and valves are working. This procedure takes approximately one hour. There are no restrictions for this procedure.  Check in at the Main Entrance of [place]: At [facility], you and your health needs are our priority.  As part of our continuing mission to provide you with exceptional heart care, we have created designated Provider Care Teams.  These Care Teams include your primary Cardiologist (physician) and Advanced Practice Providers (APPs -  Physician Assistants and Nurse Practitioners) who all work together to provide you with the care you need, when you need it.  We recommend signing up for the patient portal called "MyChart".  Sign up information is provided on this After Visit Summary.  MyChart is used to connect with patients for Virtual Visits (Telemedicine).  Patients are able to view lab/test results, encounter notes, upcoming appointments, etc.  Non-urgent messages can be sent to your provider as well.   To learn more about what you can do with MyChart, go to [email or web address].    Follow up with Dr Percival Spanish as needed.  Thank you for choosing [facility]!!    Please keep a blood pressure diary.

## 2019-08-22 ENCOUNTER — Ambulatory Visit (INDEPENDENT_AMBULATORY_CARE_PROVIDER_SITE_OTHER): Payer: Medicare Other | Admitting: Cardiology

## 2019-08-22 DIAGNOSIS — Z79899 Other long term (current) drug therapy: Secondary | ICD-10-CM

## 2019-08-22 DIAGNOSIS — I4892 Unspecified atrial flutter: Secondary | ICD-10-CM

## 2019-08-22 DIAGNOSIS — I482 Chronic atrial fibrillation, unspecified: Secondary | ICD-10-CM

## 2019-08-22 LAB — POCT INR: INR: 2.4 (ref 2.0–3.0)

## 2019-08-28 ENCOUNTER — Ambulatory Visit (INDEPENDENT_AMBULATORY_CARE_PROVIDER_SITE_OTHER): Payer: Medicare Other | Admitting: Cardiovascular Disease

## 2019-08-28 DIAGNOSIS — Z79899 Other long term (current) drug therapy: Secondary | ICD-10-CM | POA: Diagnosis not present

## 2019-08-28 DIAGNOSIS — I482 Chronic atrial fibrillation, unspecified: Secondary | ICD-10-CM | POA: Diagnosis not present

## 2019-08-28 DIAGNOSIS — I4892 Unspecified atrial flutter: Secondary | ICD-10-CM

## 2019-08-28 LAB — POCT INR: INR: 2.2 (ref 2.0–3.0)

## 2019-08-30 ENCOUNTER — Other Ambulatory Visit: Payer: Self-pay

## 2019-08-30 ENCOUNTER — Other Ambulatory Visit: Payer: Self-pay | Admitting: Cardiology

## 2019-08-30 ENCOUNTER — Other Ambulatory Visit: Payer: Medicare Other

## 2019-08-30 DIAGNOSIS — I4892 Unspecified atrial flutter: Secondary | ICD-10-CM | POA: Diagnosis not present

## 2019-08-30 DIAGNOSIS — R7989 Other specified abnormal findings of blood chemistry: Secondary | ICD-10-CM | POA: Diagnosis not present

## 2019-08-31 LAB — TSH: TSH: 5.41 u[IU]/mL — ABNORMAL HIGH (ref 0.450–4.500)

## 2019-09-03 ENCOUNTER — Other Ambulatory Visit: Payer: Self-pay

## 2019-09-03 ENCOUNTER — Ambulatory Visit (HOSPITAL_COMMUNITY)
Admission: RE | Admit: 2019-09-03 | Discharge: 2019-09-03 | Disposition: A | Discharge status: Home / Self Care | Payer: Medicare Other | Source: Ambulatory Visit | Attending: Cardiology | Admitting: Cardiology

## 2019-09-03 DIAGNOSIS — I5032 Chronic diastolic (congestive) heart failure: Secondary | ICD-10-CM | POA: Diagnosis not present

## 2019-09-03 DIAGNOSIS — I4892 Unspecified atrial flutter: Secondary | ICD-10-CM | POA: Diagnosis not present

## 2019-09-03 DIAGNOSIS — I059 Rheumatic mitral valve disease, unspecified: Secondary | ICD-10-CM | POA: Diagnosis not present

## 2019-09-03 NOTE — Progress Notes (Signed)
*  PRELIMINARY RESULTS* Echocardiogram 2D Echocardiogram has been performed.  Larry Gates 09/03/2019, 12:23 PM

## 2019-09-04 ENCOUNTER — Telehealth: Payer: Self-pay

## 2019-09-04 DIAGNOSIS — I059 Rheumatic mitral valve disease, unspecified: Secondary | ICD-10-CM

## 2019-09-04 DIAGNOSIS — I5032 Chronic diastolic (congestive) heart failure: Secondary | ICD-10-CM

## 2019-09-04 DIAGNOSIS — I350 Nonrheumatic aortic (valve) stenosis: Secondary | ICD-10-CM

## 2019-09-04 LAB — POCT INR: INR: 2.4 (ref 2.0–3.0)

## 2019-09-04 NOTE — Telephone Encounter (Signed)
Pts son, Octavia Bruckner, notified of the pts echo results and verbalized understanding. Order placed for repeat Echo in one year.

## 2019-09-05 ENCOUNTER — Ambulatory Visit (INDEPENDENT_AMBULATORY_CARE_PROVIDER_SITE_OTHER): Payer: Medicare Other | Admitting: Pharmacist

## 2019-09-05 DIAGNOSIS — I482 Chronic atrial fibrillation, unspecified: Secondary | ICD-10-CM

## 2019-09-05 DIAGNOSIS — Z79899 Other long term (current) drug therapy: Secondary | ICD-10-CM

## 2019-09-05 DIAGNOSIS — I4892 Unspecified atrial flutter: Secondary | ICD-10-CM

## 2019-09-11 DIAGNOSIS — Z7901 Long term (current) use of anticoagulants: Secondary | ICD-10-CM | POA: Diagnosis not present

## 2019-09-11 DIAGNOSIS — I4821 Permanent atrial fibrillation: Secondary | ICD-10-CM | POA: Diagnosis not present

## 2019-09-11 LAB — POCT INR: INR: 2.1 (ref 2.0–3.0)

## 2019-09-17 ENCOUNTER — Telehealth: Payer: Self-pay

## 2019-09-17 ENCOUNTER — Ambulatory Visit (INDEPENDENT_AMBULATORY_CARE_PROVIDER_SITE_OTHER): Payer: Medicare Other | Admitting: Pharmacist

## 2019-09-17 DIAGNOSIS — I482 Chronic atrial fibrillation, unspecified: Secondary | ICD-10-CM

## 2019-09-17 DIAGNOSIS — I4892 Unspecified atrial flutter: Secondary | ICD-10-CM

## 2019-09-17 DIAGNOSIS — Z79899 Other long term (current) drug therapy: Secondary | ICD-10-CM

## 2019-09-17 DIAGNOSIS — R7989 Other specified abnormal findings of blood chemistry: Secondary | ICD-10-CM

## 2019-09-17 NOTE — Telephone Encounter (Signed)
Minus Breeding, MD  Sherrie Mustache, RN  TSH is minimally elevated. Please check a T3 and T4    Left detailed voicemail at primary phone number per DPR. Orders placed for lab work.

## 2019-09-18 LAB — POCT INR: INR: 3 (ref 2.0–3.0)

## 2019-09-19 ENCOUNTER — Ambulatory Visit (INDEPENDENT_AMBULATORY_CARE_PROVIDER_SITE_OTHER): Payer: Medicare Other | Admitting: Cardiology

## 2019-09-19 DIAGNOSIS — Z79899 Other long term (current) drug therapy: Secondary | ICD-10-CM

## 2019-09-19 DIAGNOSIS — I482 Chronic atrial fibrillation, unspecified: Secondary | ICD-10-CM

## 2019-09-19 DIAGNOSIS — I4892 Unspecified atrial flutter: Secondary | ICD-10-CM | POA: Diagnosis not present

## 2019-09-19 IMAGING — CT CT SHOULDER*R* W/O CM
3 of 5 series · 10 of 35 positions shown, 12 images · non-contrast
Comparison: Radiograph 08/05/2017

CLINICAL DATA: Fall with abnormal x-ray

EXAM:
CT OF THE UPPER RIGHT EXTREMITY WITHOUT CONTRAST
TECHNIQUE: Multidetector CT imaging of the upper right extremity was performed
according to the standard protocol.

[Series 4: ax bone · axial · 0.35mm/px · z∈[-228,-69]mm · 2 of 81 slices shown, 3 images]
[im 1/81  soft-tissue]
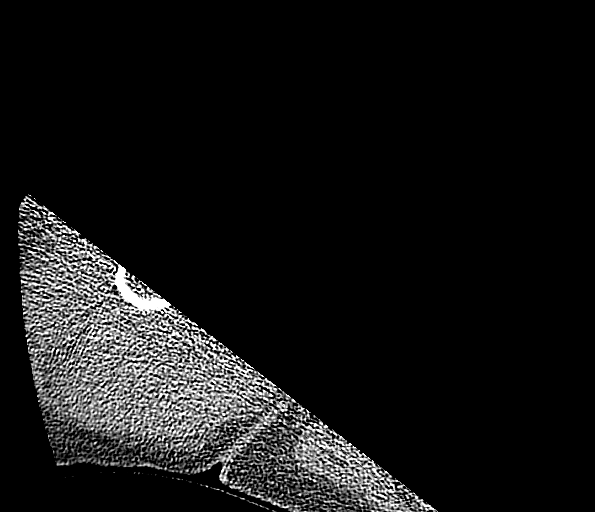
[im 1/81  bone]
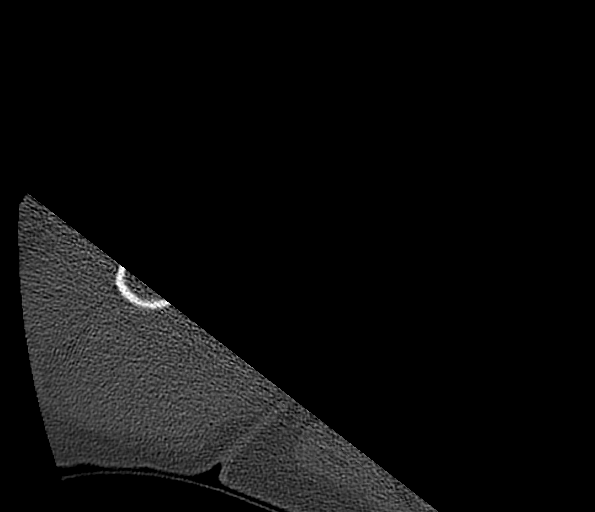
[im 81/81  bone]
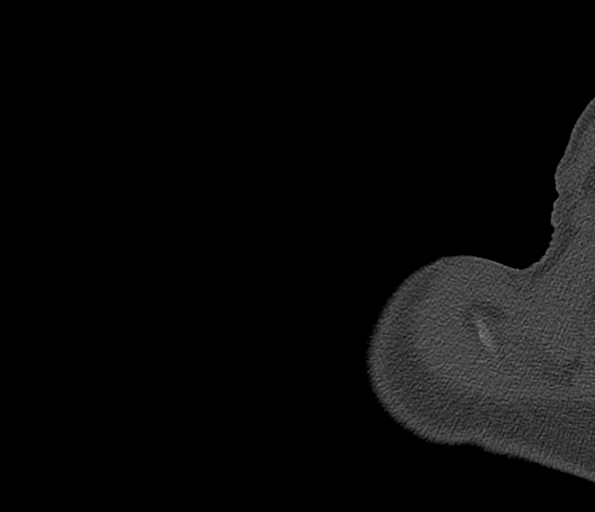

[Series 8: cor st · coronal · 0.31mm/px · 3 of 97 slices shown]
[im 20/97  bone]
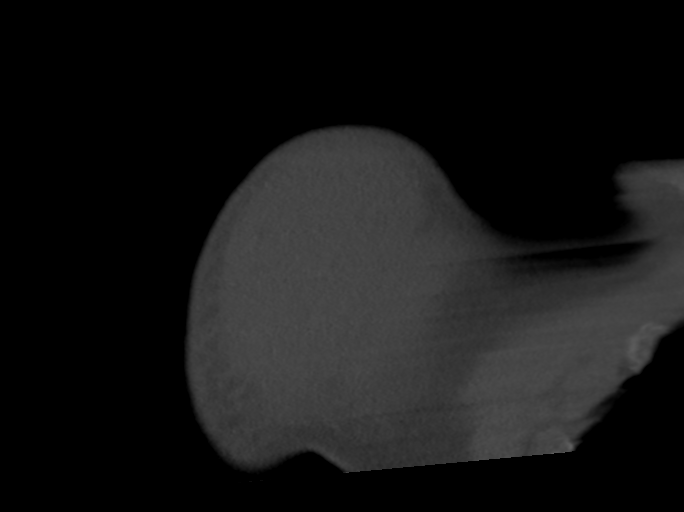
[im 39/97  bone]
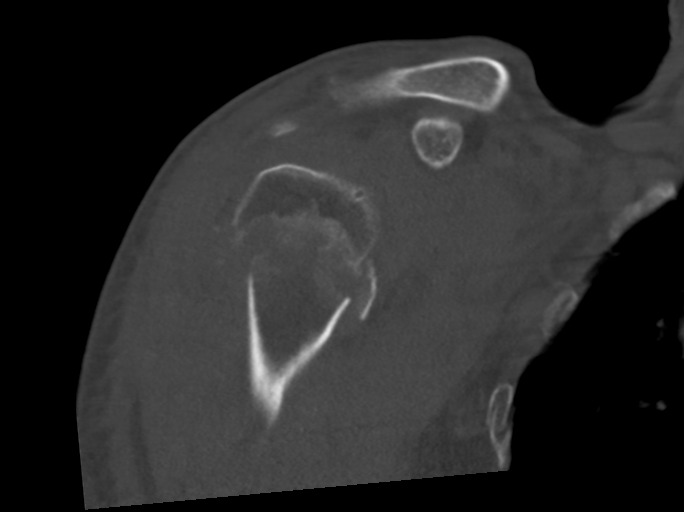
[im 58/97  bone]
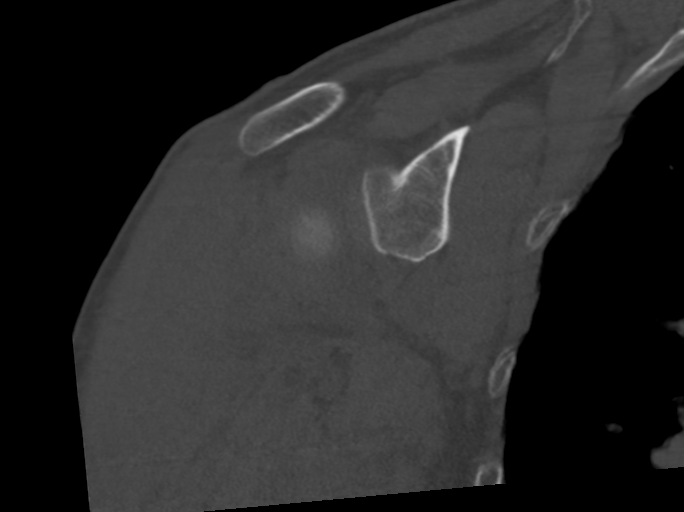

[Series 9: sag st · sagittal · 0.34mm/px · 5 of 102 slices shown, 6 images]
[im 34/102  bone]
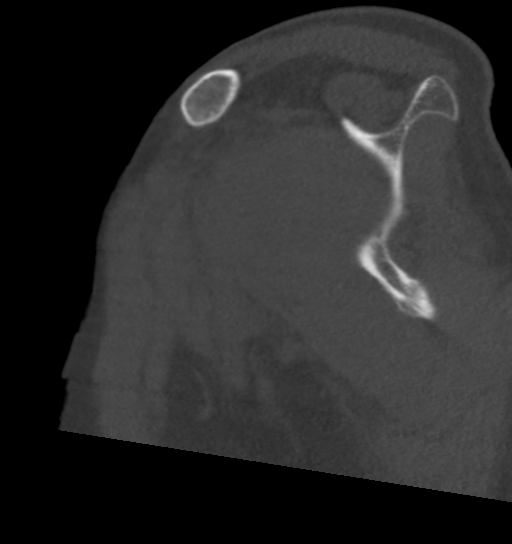
[im 43/102  bone]
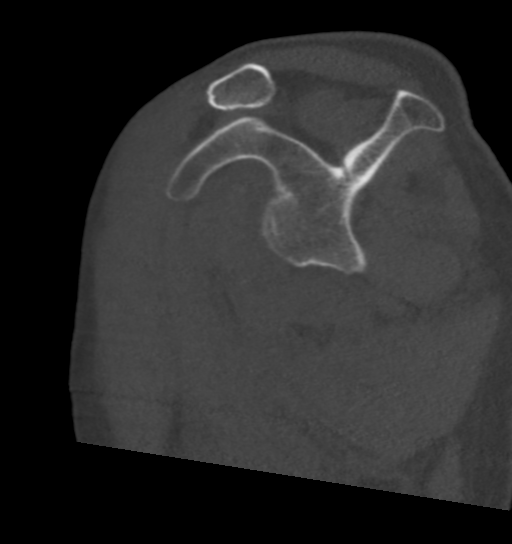
[im 51/102  soft-tissue]
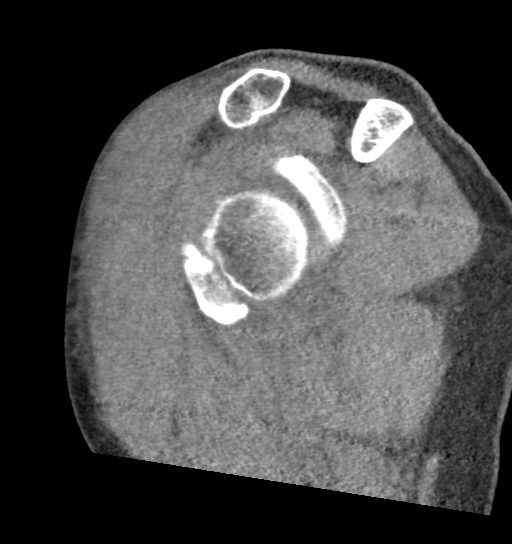
[im 51/102  bone]
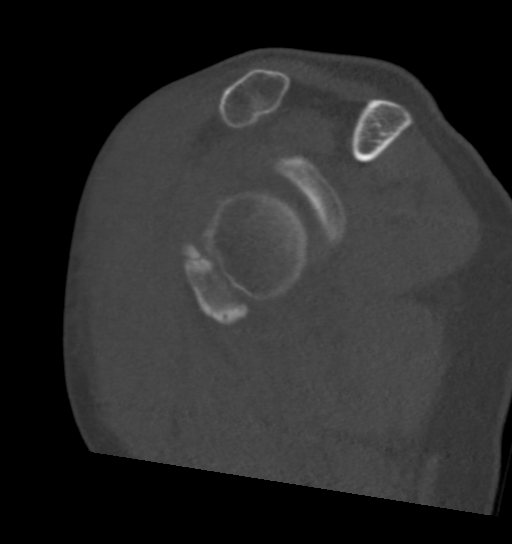
[im 59/102  bone]
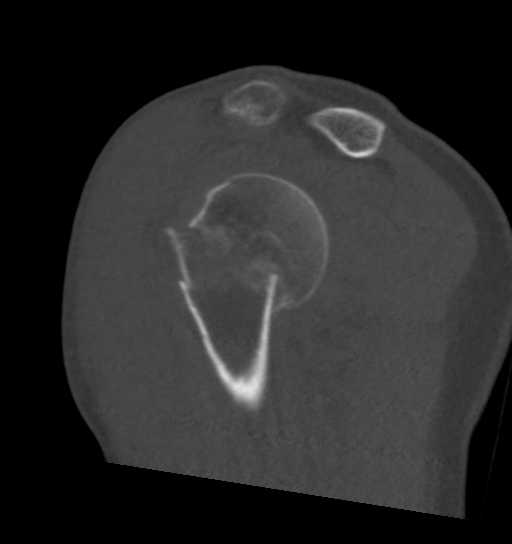
[im 68/102  bone]
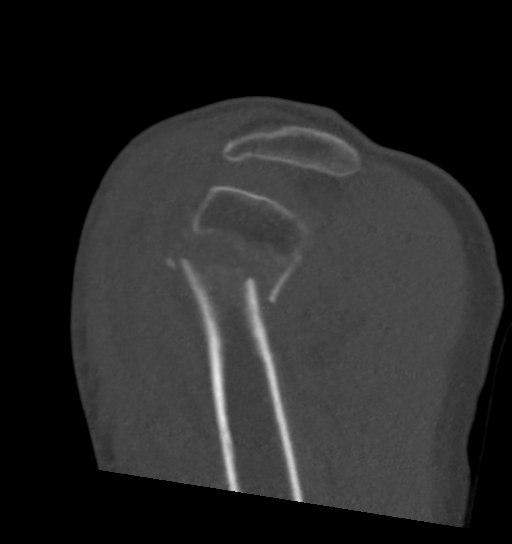

[10 of 35 positions shown; findings below may reference images not displayed]

FINDINGS: Bones/Joint/Cartilage

Acute, comminuted and impacted right humeral neck fracture. About
[DATE] shaft diameter of anterior displacement of remaining shaft of
the humerus with respect to the humeral head and mild posterior
angulation. No definite extension of fracture lucency to the
articular surface. Small displaced bony fragments posteriorly and
anteromedially. Possible old fracture deformity of the scapula. No
fracture lucency through the glenoid. The AC joint appears intact.
There are old right-sided rib fractures.

Ligaments

Suboptimally assessed by CT.

Muscles and Tendons

Normal muscle bulk.

Soft tissues

Edema within the right upper arm. Lungs demonstrate cyst in the
right lung.
IMPRESSION: Acute comminuted and impacted right humeral neck fracture with mild
displacement and angulation. Associated soft tissue swelling. Old
right-sided rib fractures.

## 2019-09-25 ENCOUNTER — Ambulatory Visit (INDEPENDENT_AMBULATORY_CARE_PROVIDER_SITE_OTHER): Payer: Medicare Other | Admitting: Pharmacist Clinician (PhC)/ Clinical Pharmacy Specialist

## 2019-09-25 ENCOUNTER — Other Ambulatory Visit: Payer: Self-pay | Admitting: Adult Health

## 2019-09-25 DIAGNOSIS — Z79899 Other long term (current) drug therapy: Secondary | ICD-10-CM

## 2019-09-25 DIAGNOSIS — I482 Chronic atrial fibrillation, unspecified: Secondary | ICD-10-CM | POA: Diagnosis not present

## 2019-09-25 DIAGNOSIS — I4892 Unspecified atrial flutter: Secondary | ICD-10-CM

## 2019-09-25 LAB — PROTIME-INR: INR: 3.4 — AB (ref 0.9–1.1)

## 2019-09-25 LAB — POCT INR: INR: 3.4 — AB (ref 2.0–3.0)

## 2019-10-02 LAB — POCT INR: INR: 3.4 — AB (ref 2.0–3.0)

## 2019-10-03 ENCOUNTER — Telehealth: Payer: Self-pay | Admitting: Cardiology

## 2019-10-03 ENCOUNTER — Ambulatory Visit (INDEPENDENT_AMBULATORY_CARE_PROVIDER_SITE_OTHER): Payer: Medicare Other | Admitting: Cardiology

## 2019-10-03 DIAGNOSIS — Z79899 Other long term (current) drug therapy: Secondary | ICD-10-CM

## 2019-10-03 DIAGNOSIS — I482 Chronic atrial fibrillation, unspecified: Secondary | ICD-10-CM

## 2019-10-03 NOTE — Telephone Encounter (Signed)
Returning call.

## 2019-10-03 NOTE — Telephone Encounter (Signed)
See anticoagulation encounter for details.

## 2019-10-03 NOTE — Telephone Encounter (Signed)
Larry Gates is returning Larry Gates's call. Please advise.

## 2019-10-09 ENCOUNTER — Ambulatory Visit (INDEPENDENT_AMBULATORY_CARE_PROVIDER_SITE_OTHER): Payer: Medicare Other | Admitting: Cardiology

## 2019-10-09 DIAGNOSIS — I482 Chronic atrial fibrillation, unspecified: Secondary | ICD-10-CM | POA: Diagnosis not present

## 2019-10-09 DIAGNOSIS — Z79899 Other long term (current) drug therapy: Secondary | ICD-10-CM

## 2019-10-09 DIAGNOSIS — I4892 Unspecified atrial flutter: Secondary | ICD-10-CM | POA: Diagnosis not present

## 2019-10-09 DIAGNOSIS — I4821 Permanent atrial fibrillation: Secondary | ICD-10-CM | POA: Diagnosis not present

## 2019-10-09 DIAGNOSIS — Z7901 Long term (current) use of anticoagulants: Secondary | ICD-10-CM | POA: Diagnosis not present

## 2019-10-09 LAB — POCT INR: INR: 2.7 (ref 2.0–3.0)

## 2019-10-16 ENCOUNTER — Other Ambulatory Visit: Payer: Self-pay | Admitting: Family Medicine

## 2019-10-16 ENCOUNTER — Other Ambulatory Visit: Payer: Self-pay | Admitting: Cardiology

## 2019-10-16 ENCOUNTER — Ambulatory Visit (INDEPENDENT_AMBULATORY_CARE_PROVIDER_SITE_OTHER): Payer: Medicare Other | Admitting: Cardiovascular Disease

## 2019-10-16 DIAGNOSIS — E039 Hypothyroidism, unspecified: Secondary | ICD-10-CM

## 2019-10-16 DIAGNOSIS — I482 Chronic atrial fibrillation, unspecified: Secondary | ICD-10-CM

## 2019-10-16 DIAGNOSIS — I4892 Unspecified atrial flutter: Secondary | ICD-10-CM | POA: Diagnosis not present

## 2019-10-16 DIAGNOSIS — Z79899 Other long term (current) drug therapy: Secondary | ICD-10-CM

## 2019-10-16 LAB — POCT INR: INR: 2.2 (ref 2.0–3.0)

## 2019-10-16 NOTE — Telephone Encounter (Signed)
30 day supply sent into pharmacy Pt had abnormal TSH 02/2019 and was told to have rechecked in 6 months-not scheduled  ntbs for further refills

## 2019-10-23 LAB — POCT INR: INR: 2.4 (ref 2.0–3.0)

## 2019-10-24 ENCOUNTER — Ambulatory Visit (INDEPENDENT_AMBULATORY_CARE_PROVIDER_SITE_OTHER): Payer: Medicare Other | Admitting: Cardiology

## 2019-10-24 DIAGNOSIS — Z79899 Other long term (current) drug therapy: Secondary | ICD-10-CM | POA: Diagnosis not present

## 2019-10-24 DIAGNOSIS — I4892 Unspecified atrial flutter: Secondary | ICD-10-CM

## 2019-10-24 DIAGNOSIS — I482 Chronic atrial fibrillation, unspecified: Secondary | ICD-10-CM

## 2019-10-24 NOTE — Patient Instructions (Signed)
Description   Continue taking warfarin 2mg  every day except for 1mg  every Monday and Wednesday.  Repeat INR in 1 week per insurance. (self-test).

## 2019-10-30 ENCOUNTER — Ambulatory Visit (INDEPENDENT_AMBULATORY_CARE_PROVIDER_SITE_OTHER): Payer: Medicare Other | Admitting: Internal Medicine

## 2019-10-30 DIAGNOSIS — I4892 Unspecified atrial flutter: Secondary | ICD-10-CM

## 2019-10-30 DIAGNOSIS — Z79899 Other long term (current) drug therapy: Secondary | ICD-10-CM | POA: Diagnosis not present

## 2019-10-30 DIAGNOSIS — I482 Chronic atrial fibrillation, unspecified: Secondary | ICD-10-CM | POA: Diagnosis not present

## 2019-10-30 LAB — POCT INR: INR: 2.2 (ref 2.0–3.0)

## 2019-11-06 ENCOUNTER — Ambulatory Visit (INDEPENDENT_AMBULATORY_CARE_PROVIDER_SITE_OTHER): Payer: Medicare Other | Admitting: Cardiology

## 2019-11-06 DIAGNOSIS — Z79899 Other long term (current) drug therapy: Secondary | ICD-10-CM | POA: Diagnosis not present

## 2019-11-06 DIAGNOSIS — I4892 Unspecified atrial flutter: Secondary | ICD-10-CM

## 2019-11-06 DIAGNOSIS — Z7901 Long term (current) use of anticoagulants: Secondary | ICD-10-CM | POA: Diagnosis not present

## 2019-11-06 DIAGNOSIS — I4821 Permanent atrial fibrillation: Secondary | ICD-10-CM | POA: Diagnosis not present

## 2019-11-06 DIAGNOSIS — I482 Chronic atrial fibrillation, unspecified: Secondary | ICD-10-CM

## 2019-11-06 LAB — POCT INR: INR: 2.7 (ref 2.0–3.0)

## 2019-11-13 ENCOUNTER — Other Ambulatory Visit: Payer: Self-pay | Admitting: Family Medicine

## 2019-11-13 ENCOUNTER — Ambulatory Visit (INDEPENDENT_AMBULATORY_CARE_PROVIDER_SITE_OTHER): Payer: Medicare Other | Admitting: Cardiology

## 2019-11-13 DIAGNOSIS — Z79899 Other long term (current) drug therapy: Secondary | ICD-10-CM

## 2019-11-13 DIAGNOSIS — I482 Chronic atrial fibrillation, unspecified: Secondary | ICD-10-CM | POA: Diagnosis not present

## 2019-11-13 DIAGNOSIS — E039 Hypothyroidism, unspecified: Secondary | ICD-10-CM

## 2019-11-13 LAB — POCT INR: INR: 3 (ref 2.0–3.0)

## 2019-11-13 MED ORDER — LEVOTHYROXINE SODIUM 88 MCG PO TABS: 88.0000 ug | ORAL_TABLET | Freq: Every day | ORAL | 0 refills | Status: DC

## 2019-11-13 NOTE — Telephone Encounter (Signed)
Stacks NTBS 30 days given 10/16/19

## 2019-11-13 NOTE — Telephone Encounter (Signed)
#  30 sent - appt made

## 2019-11-13 NOTE — Addendum Note (Signed)
Addended by: Zannie Cove on: 11/13/2019 04:33 PM   Modules accepted: Orders

## 2019-11-20 ENCOUNTER — Ambulatory Visit (INDEPENDENT_AMBULATORY_CARE_PROVIDER_SITE_OTHER): Payer: Medicare Other | Admitting: Cardiovascular Disease

## 2019-11-20 DIAGNOSIS — I482 Chronic atrial fibrillation, unspecified: Secondary | ICD-10-CM

## 2019-11-20 DIAGNOSIS — Z79899 Other long term (current) drug therapy: Secondary | ICD-10-CM | POA: Diagnosis not present

## 2019-11-20 LAB — POCT INR: INR: 2.4 (ref 2.0–3.0)

## 2019-11-29 ENCOUNTER — Telehealth: Payer: Self-pay | Admitting: Cardiology

## 2019-11-29 ENCOUNTER — Encounter: Payer: Self-pay | Admitting: Family Medicine

## 2019-11-29 ENCOUNTER — Other Ambulatory Visit: Payer: Self-pay

## 2019-11-29 ENCOUNTER — Ambulatory Visit (INDEPENDENT_AMBULATORY_CARE_PROVIDER_SITE_OTHER): Payer: Medicare Other | Admitting: Family Medicine

## 2019-11-29 VITALS — BP 132/60 | HR 62 | Temp 97.6°F | Ht 66.0 in | Wt 122.2 lb

## 2019-11-29 DIAGNOSIS — E039 Hypothyroidism, unspecified: Secondary | ICD-10-CM

## 2019-11-29 DIAGNOSIS — I482 Chronic atrial fibrillation, unspecified: Secondary | ICD-10-CM

## 2019-11-29 NOTE — Progress Notes (Signed)
Subjective:  [name], male    DOB: [birth year]  Age: 86 y.o. MRN: [id number]  CC: Follow-up (6 month)   HPI Larry Gates presents for patient presents for follow-up on  thyroid. The patient has a history of hypothyroidism for many years. It has been stable recently. Pt. denies any change in  voice, loss of hair, heat or cold intolerance. Energy level has been adequate to good. Patient denies constipation and diarrhea. No myxedema. Medication is as noted below. Verified that pt is taking it daily on an empty stomach. Well tolerated.   Patient in for follow-up of atrial fibrillation. Patient denies any recent bouts of chest pain or palpitations. Additionally, patient is taking anticoagulants. Patient denies any recent excessive bleeding episodes including epistaxis, bleeding from the gums, genitalia, rectal bleeding or hematuria. Additionally there has been no excessive bruising.  Depression screen [facility] 2/9 11/29/2019 08/02/2019 06/13/2019  Decreased Interest 0 0 0  Down, Depressed, Hopeless 0 0 0  PHQ - 2 Score 0 0 0    History Larry Gates has a past medical history of Anticoagulated on Coumadin, Atypical atrial flutter ([place]), Cataract, Hard of hearing, Midsternal chest pain, Prostate cancer ([place]), TIA (transient ischemic attack), and Vertigo.   Larry Gates has a past surgical history that includes Appendectomy; Tonsillectomy; Cardioversion (10/14/2011); Cardioversion (N/A, 07/24/2012); I & D extremity (Left, 05/04/2013); and Eye surgery (Bilateral).   His family history includes Alzheimer's disease in his father and mother; CVA in his father; Early death in his brother and sister; Heart Problems in his brother; Leukemia in his father.Larry Gates reports that Larry Gates has never smoked. Larry Gates has never used smokeless tobacco. Larry Gates reports that Larry Gates does not drink alcohol and does not use drugs.    ROS Review of Systems  Unable to perform ROS: Other (pt. essentially deaf. History given by his son)    Objective:    BP 132/60    Pulse 62    Temp 97.6 F (36.4 C) (Temporal)    Ht 5' 6" (1.676 m)    Wt 122 lb 3.2 oz (55.4 kg)    BMI 19.72 kg/m   BP Readings from Last 3 Encounters:  11/29/19 132/60  08/21/19 (!) 158/70  06/13/19 139/62    Wt Readings from Last 3 Encounters:  11/29/19 122 lb 3.2 oz (55.4 kg)  08/21/19 128 lb (58.1 kg)  08/02/19 129 lb 13.6 oz (58.9 kg)     Physical Exam Vitals reviewed.  Constitutional:      Appearance: Larry Gates is well-developed.  HENT:     Head: Normocephalic and atraumatic.     Right Ear: External ear normal. No decreased hearing noted.     Left Ear: External ear normal. No decreased hearing noted.     Nose: Nose normal.     Mouth/Throat:     Pharynx: No oropharyngeal exudate or posterior oropharyngeal erythema.  Eyes:     Pupils: Pupils are equal, round, and reactive to light.  Cardiovascular:     Rate and Rhythm: Normal rate and regular rhythm.     Heart sounds: No murmur heard.   Pulmonary:     Effort: No respiratory distress.     Breath sounds: Normal breath sounds.  Abdominal:     General: Bowel sounds are normal.     Palpations: Abdomen is soft. There is no mass.     Tenderness: There is no abdominal tenderness.  Musculoskeletal:     Cervical back: Normal range of motion and neck  supple.       Assessment & Plan:   Laden was seen today for follow-up.  Diagnoses and all orders for this visit:  Acquired hypothyroidism -     CBC with Differential/Platelet -     CMP14+EGFR -     TSH + free T4 -     VITAMIN D 25 Hydroxy (Vit-D Deficiency, Fractures)  Atrial fibrillation, chronic (HCC)       I am having Larry Gates maintain his losartan, furosemide, Pacerone, warfarin, Dilt-XR, and levothyroxine.  Allergies as of 11/29/2019      Reactions   Penicillins Rash      Medication List       Accurate as of November 29, 2019  6:09 PM. If you have any questions, ask your nurse or doctor.        Dilt-XR 240 MG 24 hr capsule Generic  drug: diltiazem TAKE 1 CAPSULE BY MOUTH ONCE DAILY . APPOINTMENT REQUIRED FOR FUTURE REFILLS   furosemide 20 MG tablet Commonly known as: LASIX Take 1 tablet (20 mg total) by mouth daily for two days. After the two days, take only AS NEEDED.   levothyroxine 88 MCG tablet Commonly known as: Euthyrox Take 1 tablet (88 mcg total) by mouth daily. Needs appointment for further refills   losartan 25 MG tablet Commonly known as: Cozaar Take 0.5 tablets (12.5 mg total) by mouth daily.   Pacerone 200 MG tablet Generic drug: amiodarone TAKE 1 TABLET BY MOUTH ONCE DAILY . APPOINTMENT REQUIRED FOR FUTURE REFILLS   warfarin 2 MG tablet Commonly known as: COUMADIN Take as directed by the anticoagulation clinic. If you are unsure how to take this medication, talk to your nurse or doctor. Original instructions: TAKE 2MG TO 3MG BY MOUTH EVERY DAY AS DIRECTE BY COUMADIN CLINIC        Follow-up: Return in about 6 months (around 05/31/2020).  Claretta Fraise, M.D.

## 2019-11-29 NOTE — Telephone Encounter (Signed)
New message:    Patient calling stating that some one called him concerning some results.

## 2019-11-29 NOTE — Telephone Encounter (Signed)
Patients Son Larry Gates (ok per DPR) called in regarding receiving a phone call today about the patients lab work. Tim stated they had just left Dr. Livia Snellen office and the patient had his blood work (thyroid levels) drawn there.   Will route this message to Dr. Cherlyn Cushing Nurse.

## 2019-11-30 LAB — CMP14+EGFR
ALT: 14 IU/L (ref 0–44)
AST: 15 IU/L (ref 0–40)
Albumin/Globulin Ratio: 2 (ref 1.2–2.2)
Albumin: 3.9 g/dL (ref 3.6–4.6)
Alkaline Phosphatase: 100 IU/L (ref 48–121)
BUN/Creatinine Ratio: 17 (ref 10–24)
BUN: 26 mg/dL (ref 8–27)
Bilirubin Total: 0.6 mg/dL (ref 0.0–1.2)
CO2: 23 mmol/L (ref 20–29)
Calcium: 8.7 mg/dL (ref 8.6–10.2)
Chloride: 105 mmol/L (ref 96–106)
Creatinine, Ser: 1.54 mg/dL — ABNORMAL HIGH (ref 0.76–1.27)
GFR calc Af Amer: 46 mL/min/{1.73_m2} — ABNORMAL LOW (ref >59)
GFR calc non Af Amer: 40 mL/min/{1.73_m2} — ABNORMAL LOW (ref >59)
Globulin, Total: 2 g/dL (ref 1.5–4.5)
Glucose: 195 mg/dL — ABNORMAL HIGH (ref 65–99)
Potassium: 4.7 mmol/L (ref 3.5–5.2)
Sodium: 139 mmol/L (ref 134–144)
Total Protein: 5.9 g/dL — ABNORMAL LOW (ref 6.0–8.5)

## 2019-11-30 LAB — CBC WITH DIFFERENTIAL/PLATELET
Basophils Absolute: 0 10*3/uL (ref 0.0–0.2)
Basos: 1 %
EOS (ABSOLUTE): 0 10*3/uL (ref 0.0–0.4)
Eos: 1 %
Hematocrit: 33.9 % — ABNORMAL LOW (ref 37.5–51.0)
Hemoglobin: 12.3 g/dL — ABNORMAL LOW (ref 13.0–17.7)
Immature Grans (Abs): 0 10*3/uL (ref 0.0–0.1)
Immature Granulocytes: 0 %
Lymphocytes Absolute: 0.5 10*3/uL — ABNORMAL LOW (ref 0.7–3.1)
Lymphs: 8 %
MCH: 33.4 pg — ABNORMAL HIGH (ref 26.6–33.0)
MCHC: 36.3 g/dL — ABNORMAL HIGH (ref 31.5–35.7)
MCV: 92 fL (ref 79–97)
Monocytes Absolute: 0.4 10*3/uL (ref 0.1–0.9)
Monocytes: 7 %
Neutrophils Absolute: 4.6 10*3/uL (ref 1.4–7.0)
Neutrophils: 83 %
Platelets: 149 10*3/uL — ABNORMAL LOW (ref 150–450)
RBC: 3.68 x10E6/uL — ABNORMAL LOW (ref 4.14–5.80)
RDW: 12.7 % (ref 11.6–15.4)
WBC: 5.5 10*3/uL (ref 3.4–10.8)

## 2019-11-30 LAB — VITAMIN D 25 HYDROXY (VIT D DEFICIENCY, FRACTURES): Vit D, 25-Hydroxy: 25.4 ng/mL — ABNORMAL LOW (ref 30.0–100.0)

## 2019-11-30 LAB — TSH+FREE T4
Free T4: 1.5 ng/dL (ref 0.82–1.77)
TSH: 3.28 u[IU]/mL (ref 0.450–4.500)

## 2019-12-04 ENCOUNTER — Ambulatory Visit (INDEPENDENT_AMBULATORY_CARE_PROVIDER_SITE_OTHER): Payer: Medicare Other | Admitting: Cardiology

## 2019-12-04 DIAGNOSIS — I480 Paroxysmal atrial fibrillation: Secondary | ICD-10-CM | POA: Diagnosis not present

## 2019-12-04 DIAGNOSIS — Z7901 Long term (current) use of anticoagulants: Secondary | ICD-10-CM | POA: Diagnosis not present

## 2019-12-04 DIAGNOSIS — I482 Chronic atrial fibrillation, unspecified: Secondary | ICD-10-CM | POA: Diagnosis not present

## 2019-12-04 DIAGNOSIS — Z79899 Other long term (current) drug therapy: Secondary | ICD-10-CM | POA: Diagnosis not present

## 2019-12-04 LAB — POCT INR: INR: 1.8 — AB (ref 2.0–3.0)

## 2019-12-06 NOTE — Telephone Encounter (Signed)
Patient's son returning call. 

## 2019-12-06 NOTE — Telephone Encounter (Signed)
Spoke with Christia Reading, patient's son. Patient had thyroid rechecked last week and results were WNL. Son does not want to check T3 and T4 at this time.

## 2019-12-06 NOTE — Telephone Encounter (Signed)
Attempted to call patient, unable to leave message. Will try again at a later date.

## 2019-12-11 ENCOUNTER — Ambulatory Visit (INDEPENDENT_AMBULATORY_CARE_PROVIDER_SITE_OTHER): Payer: Medicare Other | Admitting: Internal Medicine

## 2019-12-11 DIAGNOSIS — Z79899 Other long term (current) drug therapy: Secondary | ICD-10-CM

## 2019-12-11 DIAGNOSIS — I482 Chronic atrial fibrillation, unspecified: Secondary | ICD-10-CM

## 2019-12-11 LAB — POCT INR: INR: 2.1 (ref 2.0–3.0)

## 2019-12-18 ENCOUNTER — Ambulatory Visit (INDEPENDENT_AMBULATORY_CARE_PROVIDER_SITE_OTHER): Payer: Medicare Other | Admitting: Cardiovascular Disease

## 2019-12-18 ENCOUNTER — Other Ambulatory Visit: Payer: Self-pay | Admitting: Family Medicine

## 2019-12-18 DIAGNOSIS — I482 Chronic atrial fibrillation, unspecified: Secondary | ICD-10-CM

## 2019-12-18 DIAGNOSIS — E039 Hypothyroidism, unspecified: Secondary | ICD-10-CM

## 2019-12-18 DIAGNOSIS — Z79899 Other long term (current) drug therapy: Secondary | ICD-10-CM

## 2019-12-18 LAB — POCT INR: INR: 2.1 (ref 2.0–3.0)

## 2019-12-18 LAB — PROTIME-INR

## 2019-12-25 ENCOUNTER — Ambulatory Visit (INDEPENDENT_AMBULATORY_CARE_PROVIDER_SITE_OTHER): Payer: Medicare Other | Admitting: Cardiovascular Disease

## 2019-12-25 DIAGNOSIS — Z79899 Other long term (current) drug therapy: Secondary | ICD-10-CM

## 2019-12-25 DIAGNOSIS — I482 Chronic atrial fibrillation, unspecified: Secondary | ICD-10-CM | POA: Diagnosis not present

## 2019-12-25 LAB — POCT INR: INR: 2.3 (ref 2.0–3.0)

## 2020-01-01 LAB — POCT INR: INR: 2.4 (ref 2.0–3.0)

## 2020-01-02 ENCOUNTER — Ambulatory Visit (INDEPENDENT_AMBULATORY_CARE_PROVIDER_SITE_OTHER): Payer: Medicare Other

## 2020-01-02 DIAGNOSIS — I4892 Unspecified atrial flutter: Secondary | ICD-10-CM

## 2020-01-02 DIAGNOSIS — Z79899 Other long term (current) drug therapy: Secondary | ICD-10-CM

## 2020-01-02 DIAGNOSIS — I482 Chronic atrial fibrillation, unspecified: Secondary | ICD-10-CM | POA: Diagnosis not present

## 2020-01-08 ENCOUNTER — Ambulatory Visit (INDEPENDENT_AMBULATORY_CARE_PROVIDER_SITE_OTHER): Payer: Medicare Other | Admitting: Cardiovascular Disease

## 2020-01-08 DIAGNOSIS — I482 Chronic atrial fibrillation, unspecified: Secondary | ICD-10-CM

## 2020-01-08 DIAGNOSIS — Z79899 Other long term (current) drug therapy: Secondary | ICD-10-CM

## 2020-01-08 LAB — POCT INR: INR: 2.6 (ref 2.0–3.0)

## 2020-01-08 NOTE — Patient Instructions (Addendum)
Continue 2mg  every day except for 1mg  every Wednesday.  Repeat INR in 1 week per insurance. (self-test). LM on voicemail with above coumadin instructions and to call back if he has questions

## 2020-01-15 LAB — POCT INR: INR: 2.7 (ref 2.0–3.0)

## 2020-01-16 ENCOUNTER — Ambulatory Visit (INDEPENDENT_AMBULATORY_CARE_PROVIDER_SITE_OTHER): Payer: Medicare Other | Admitting: Cardiology

## 2020-01-16 DIAGNOSIS — Z79899 Other long term (current) drug therapy: Secondary | ICD-10-CM | POA: Diagnosis not present

## 2020-01-16 DIAGNOSIS — I4892 Unspecified atrial flutter: Secondary | ICD-10-CM | POA: Diagnosis not present

## 2020-01-16 DIAGNOSIS — I482 Chronic atrial fibrillation, unspecified: Secondary | ICD-10-CM | POA: Diagnosis not present

## 2020-01-23 LAB — POCT INR: INR: 2.7 (ref 2.0–3.0)

## 2020-01-29 ENCOUNTER — Ambulatory Visit (INDEPENDENT_AMBULATORY_CARE_PROVIDER_SITE_OTHER): Payer: Medicare Other | Admitting: Cardiology

## 2020-01-29 DIAGNOSIS — Z79899 Other long term (current) drug therapy: Secondary | ICD-10-CM | POA: Diagnosis not present

## 2020-01-29 DIAGNOSIS — I482 Chronic atrial fibrillation, unspecified: Secondary | ICD-10-CM | POA: Diagnosis not present

## 2020-01-29 LAB — POCT INR: INR: 4.7 — AB (ref 2.0–3.0)

## 2020-01-30 DIAGNOSIS — Z8673 Personal history of transient ischemic attack (TIA), and cerebral infarction without residual deficits: Secondary | ICD-10-CM | POA: Diagnosis not present

## 2020-01-30 DIAGNOSIS — I517 Cardiomegaly: Secondary | ICD-10-CM | POA: Diagnosis not present

## 2020-01-30 DIAGNOSIS — J810 Acute pulmonary edema: Secondary | ICD-10-CM | POA: Diagnosis not present

## 2020-01-30 DIAGNOSIS — I4892 Unspecified atrial flutter: Secondary | ICD-10-CM | POA: Diagnosis not present

## 2020-01-30 DIAGNOSIS — Z823 Family history of stroke: Secondary | ICD-10-CM | POA: Diagnosis not present

## 2020-01-30 DIAGNOSIS — E8770 Fluid overload, unspecified: Secondary | ICD-10-CM | POA: Diagnosis not present

## 2020-01-30 DIAGNOSIS — Z79890 Hormone replacement therapy (postmenopausal): Secondary | ICD-10-CM | POA: Diagnosis not present

## 2020-01-30 DIAGNOSIS — Z20822 Contact with and (suspected) exposure to covid-19: Secondary | ICD-10-CM | POA: Diagnosis not present

## 2020-01-30 DIAGNOSIS — Z66 Do not resuscitate: Secondary | ICD-10-CM | POA: Diagnosis not present

## 2020-01-30 DIAGNOSIS — Z880 Allergy status to penicillin: Secondary | ICD-10-CM | POA: Diagnosis not present

## 2020-01-30 DIAGNOSIS — I10 Essential (primary) hypertension: Secondary | ICD-10-CM | POA: Diagnosis not present

## 2020-01-30 DIAGNOSIS — R9431 Abnormal electrocardiogram [ECG] [EKG]: Secondary | ICD-10-CM | POA: Diagnosis not present

## 2020-01-30 DIAGNOSIS — Z7901 Long term (current) use of anticoagulants: Secondary | ICD-10-CM | POA: Diagnosis not present

## 2020-01-30 DIAGNOSIS — Z806 Family history of leukemia: Secondary | ICD-10-CM | POA: Diagnosis not present

## 2020-01-30 DIAGNOSIS — I350 Nonrheumatic aortic (valve) stenosis: Secondary | ICD-10-CM | POA: Diagnosis not present

## 2020-01-30 DIAGNOSIS — J986 Disorders of diaphragm: Secondary | ICD-10-CM | POA: Diagnosis not present

## 2020-01-30 DIAGNOSIS — I484 Atypical atrial flutter: Secondary | ICD-10-CM | POA: Diagnosis not present

## 2020-01-30 DIAGNOSIS — Z8546 Personal history of malignant neoplasm of prostate: Secondary | ICD-10-CM | POA: Diagnosis not present

## 2020-01-30 DIAGNOSIS — Z79899 Other long term (current) drug therapy: Secondary | ICD-10-CM | POA: Diagnosis not present

## 2020-01-30 DIAGNOSIS — I509 Heart failure, unspecified: Secondary | ICD-10-CM | POA: Diagnosis not present

## 2020-01-30 DIAGNOSIS — E039 Hypothyroidism, unspecified: Secondary | ICD-10-CM | POA: Diagnosis not present

## 2020-01-30 DIAGNOSIS — I080 Rheumatic disorders of both mitral and aortic valves: Secondary | ICD-10-CM | POA: Diagnosis not present

## 2020-01-30 DIAGNOSIS — I110 Hypertensive heart disease with heart failure: Secondary | ICD-10-CM | POA: Diagnosis not present

## 2020-01-30 DIAGNOSIS — R0602 Shortness of breath: Secondary | ICD-10-CM | POA: Diagnosis not present

## 2020-01-31 DIAGNOSIS — I10 Essential (primary) hypertension: Secondary | ICD-10-CM | POA: Diagnosis not present

## 2020-01-31 DIAGNOSIS — J810 Acute pulmonary edema: Secondary | ICD-10-CM | POA: Diagnosis not present

## 2020-01-31 DIAGNOSIS — I350 Nonrheumatic aortic (valve) stenosis: Secondary | ICD-10-CM | POA: Insufficient documentation

## 2020-01-31 DIAGNOSIS — E8770 Fluid overload, unspecified: Secondary | ICD-10-CM | POA: Diagnosis not present

## 2020-02-04 ENCOUNTER — Other Ambulatory Visit: Payer: Self-pay

## 2020-02-04 ENCOUNTER — Ambulatory Visit (INDEPENDENT_AMBULATORY_CARE_PROVIDER_SITE_OTHER): Payer: Medicare Other

## 2020-02-04 DIAGNOSIS — Z23 Encounter for immunization: Secondary | ICD-10-CM

## 2020-02-05 ENCOUNTER — Ambulatory Visit (INDEPENDENT_AMBULATORY_CARE_PROVIDER_SITE_OTHER): Payer: Medicare Other | Admitting: Cardiovascular Disease

## 2020-02-05 DIAGNOSIS — Z79899 Other long term (current) drug therapy: Secondary | ICD-10-CM | POA: Diagnosis not present

## 2020-02-05 DIAGNOSIS — I482 Chronic atrial fibrillation, unspecified: Secondary | ICD-10-CM | POA: Diagnosis not present

## 2020-02-05 LAB — POCT INR: INR: 2.8 (ref 2.0–3.0)

## 2020-02-10 DIAGNOSIS — S32591A Other specified fracture of right pubis, initial encounter for closed fracture: Secondary | ICD-10-CM | POA: Diagnosis not present

## 2020-02-10 DIAGNOSIS — S32592A Other specified fracture of left pubis, initial encounter for closed fracture: Secondary | ICD-10-CM | POA: Diagnosis not present

## 2020-02-10 DIAGNOSIS — Z8673 Personal history of transient ischemic attack (TIA), and cerebral infarction without residual deficits: Secondary | ICD-10-CM | POA: Diagnosis not present

## 2020-02-10 DIAGNOSIS — I10 Essential (primary) hypertension: Secondary | ICD-10-CM | POA: Diagnosis not present

## 2020-02-10 DIAGNOSIS — S32511A Fracture of superior rim of right pubis, initial encounter for closed fracture: Secondary | ICD-10-CM | POA: Diagnosis not present

## 2020-02-10 DIAGNOSIS — S300XXA Contusion of lower back and pelvis, initial encounter: Secondary | ICD-10-CM | POA: Diagnosis not present

## 2020-02-10 DIAGNOSIS — Z880 Allergy status to penicillin: Secondary | ICD-10-CM | POA: Diagnosis not present

## 2020-02-10 DIAGNOSIS — Z79899 Other long term (current) drug therapy: Secondary | ICD-10-CM | POA: Diagnosis not present

## 2020-02-10 DIAGNOSIS — Z043 Encounter for examination and observation following other accident: Secondary | ICD-10-CM | POA: Diagnosis not present

## 2020-02-10 DIAGNOSIS — S32512A Fracture of superior rim of left pubis, initial encounter for closed fracture: Secondary | ICD-10-CM | POA: Diagnosis not present

## 2020-02-10 DIAGNOSIS — I4892 Unspecified atrial flutter: Secondary | ICD-10-CM | POA: Diagnosis not present

## 2020-02-10 DIAGNOSIS — Z7901 Long term (current) use of anticoagulants: Secondary | ICD-10-CM | POA: Diagnosis not present

## 2020-02-12 ENCOUNTER — Ambulatory Visit (INDEPENDENT_AMBULATORY_CARE_PROVIDER_SITE_OTHER): Payer: Medicare Other | Admitting: Cardiovascular Disease

## 2020-02-12 DIAGNOSIS — Z79899 Other long term (current) drug therapy: Secondary | ICD-10-CM | POA: Diagnosis not present

## 2020-02-12 DIAGNOSIS — I482 Chronic atrial fibrillation, unspecified: Secondary | ICD-10-CM

## 2020-02-12 DIAGNOSIS — I4892 Unspecified atrial flutter: Secondary | ICD-10-CM

## 2020-02-12 LAB — POCT INR: INR: 2.1 (ref 2.0–3.0)

## 2020-02-19 ENCOUNTER — Ambulatory Visit (INDEPENDENT_AMBULATORY_CARE_PROVIDER_SITE_OTHER): Payer: Medicare Other | Admitting: Cardiology

## 2020-02-19 DIAGNOSIS — I482 Chronic atrial fibrillation, unspecified: Secondary | ICD-10-CM

## 2020-02-19 DIAGNOSIS — Z79899 Other long term (current) drug therapy: Secondary | ICD-10-CM

## 2020-02-19 LAB — POCT INR: INR: 2.4 (ref 2.0–3.0)

## 2020-02-26 ENCOUNTER — Ambulatory Visit (INDEPENDENT_AMBULATORY_CARE_PROVIDER_SITE_OTHER): Payer: Medicare Other | Admitting: Internal Medicine

## 2020-02-26 DIAGNOSIS — I482 Chronic atrial fibrillation, unspecified: Secondary | ICD-10-CM

## 2020-02-26 DIAGNOSIS — Z79899 Other long term (current) drug therapy: Secondary | ICD-10-CM

## 2020-02-26 LAB — POCT INR: INR: 2.9 (ref 2.0–3.0)

## 2020-03-04 ENCOUNTER — Ambulatory Visit (INDEPENDENT_AMBULATORY_CARE_PROVIDER_SITE_OTHER): Payer: Medicare Other | Admitting: Cardiology

## 2020-03-04 DIAGNOSIS — Z79899 Other long term (current) drug therapy: Secondary | ICD-10-CM

## 2020-03-04 DIAGNOSIS — I482 Chronic atrial fibrillation, unspecified: Secondary | ICD-10-CM

## 2020-03-04 LAB — POCT INR: INR: 2.8 (ref 2.0–3.0)

## 2020-03-11 ENCOUNTER — Ambulatory Visit (INDEPENDENT_AMBULATORY_CARE_PROVIDER_SITE_OTHER): Payer: Medicare Other | Admitting: Cardiovascular Disease

## 2020-03-11 DIAGNOSIS — I482 Chronic atrial fibrillation, unspecified: Secondary | ICD-10-CM

## 2020-03-11 DIAGNOSIS — Z79899 Other long term (current) drug therapy: Secondary | ICD-10-CM | POA: Diagnosis not present

## 2020-03-11 LAB — POCT INR: INR: 2.7 (ref 2.0–3.0)

## 2020-03-18 ENCOUNTER — Ambulatory Visit (INDEPENDENT_AMBULATORY_CARE_PROVIDER_SITE_OTHER): Payer: Medicare Other | Admitting: Cardiology

## 2020-03-18 DIAGNOSIS — I482 Chronic atrial fibrillation, unspecified: Secondary | ICD-10-CM

## 2020-03-18 DIAGNOSIS — Z5181 Encounter for therapeutic drug level monitoring: Secondary | ICD-10-CM

## 2020-03-18 DIAGNOSIS — Z79899 Other long term (current) drug therapy: Secondary | ICD-10-CM | POA: Diagnosis not present

## 2020-03-18 LAB — POCT INR: INR: 2.3 (ref 2.0–3.0)

## 2020-03-25 ENCOUNTER — Ambulatory Visit (INDEPENDENT_AMBULATORY_CARE_PROVIDER_SITE_OTHER): Payer: Medicare Other | Admitting: Cardiovascular Disease

## 2020-03-25 DIAGNOSIS — S50311A Abrasion of right elbow, initial encounter: Secondary | ICD-10-CM | POA: Diagnosis not present

## 2020-03-25 DIAGNOSIS — I4892 Unspecified atrial flutter: Secondary | ICD-10-CM | POA: Diagnosis not present

## 2020-03-25 DIAGNOSIS — Z880 Allergy status to penicillin: Secondary | ICD-10-CM | POA: Diagnosis not present

## 2020-03-25 DIAGNOSIS — Z79899 Other long term (current) drug therapy: Secondary | ICD-10-CM | POA: Diagnosis not present

## 2020-03-25 DIAGNOSIS — S32511A Fracture of superior rim of right pubis, initial encounter for closed fracture: Secondary | ICD-10-CM | POA: Diagnosis not present

## 2020-03-25 DIAGNOSIS — G8911 Acute pain due to trauma: Secondary | ICD-10-CM | POA: Diagnosis not present

## 2020-03-25 DIAGNOSIS — Z043 Encounter for examination and observation following other accident: Secondary | ICD-10-CM | POA: Diagnosis not present

## 2020-03-25 DIAGNOSIS — I482 Chronic atrial fibrillation, unspecified: Secondary | ICD-10-CM

## 2020-03-25 DIAGNOSIS — S32511B Fracture of superior rim of right pubis, initial encounter for open fracture: Secondary | ICD-10-CM | POA: Diagnosis not present

## 2020-03-25 DIAGNOSIS — Z8673 Personal history of transient ischemic attack (TIA), and cerebral infarction without residual deficits: Secondary | ICD-10-CM | POA: Diagnosis not present

## 2020-03-25 DIAGNOSIS — Z7901 Long term (current) use of anticoagulants: Secondary | ICD-10-CM | POA: Diagnosis not present

## 2020-03-25 DIAGNOSIS — I10 Essential (primary) hypertension: Secondary | ICD-10-CM | POA: Diagnosis not present

## 2020-03-25 LAB — POCT INR: INR: 2.3 (ref 2.0–3.0)

## 2020-04-01 ENCOUNTER — Ambulatory Visit (INDEPENDENT_AMBULATORY_CARE_PROVIDER_SITE_OTHER): Payer: Medicare Other | Admitting: Cardiology

## 2020-04-01 DIAGNOSIS — N390 Urinary tract infection, site not specified: Secondary | ICD-10-CM | POA: Diagnosis not present

## 2020-04-01 DIAGNOSIS — I482 Chronic atrial fibrillation, unspecified: Secondary | ICD-10-CM

## 2020-04-01 DIAGNOSIS — Z79899 Other long term (current) drug therapy: Secondary | ICD-10-CM | POA: Diagnosis not present

## 2020-04-01 LAB — POCT INR: INR: 3.2 — AB (ref 2.0–3.0)

## 2020-04-07 ENCOUNTER — Telehealth: Payer: Self-pay

## 2020-04-07 NOTE — Telephone Encounter (Signed)
Spoke with patients son. Informed that pt will need to be seen in the office before PT referral could be placed. Son understood. Appt made for 04/29/20 in 30 min slot. Son states that he wanted to wait until after the first of the year for an appt

## 2020-04-08 ENCOUNTER — Other Ambulatory Visit: Payer: Self-pay | Admitting: Cardiology

## 2020-04-08 ENCOUNTER — Ambulatory Visit (INDEPENDENT_AMBULATORY_CARE_PROVIDER_SITE_OTHER): Payer: Medicare Other | Admitting: Cardiovascular Disease

## 2020-04-08 DIAGNOSIS — Z79899 Other long term (current) drug therapy: Secondary | ICD-10-CM | POA: Diagnosis not present

## 2020-04-08 DIAGNOSIS — I482 Chronic atrial fibrillation, unspecified: Secondary | ICD-10-CM

## 2020-04-08 LAB — POCT INR: INR: 3.5 — AB (ref 2.0–3.0)

## 2020-04-15 ENCOUNTER — Ambulatory Visit (INDEPENDENT_AMBULATORY_CARE_PROVIDER_SITE_OTHER): Payer: Medicare Other | Admitting: Cardiovascular Disease

## 2020-04-15 DIAGNOSIS — Z79899 Other long term (current) drug therapy: Secondary | ICD-10-CM

## 2020-04-15 DIAGNOSIS — I482 Chronic atrial fibrillation, unspecified: Secondary | ICD-10-CM

## 2020-04-15 LAB — POCT INR: INR: 3 (ref 2.0–3.0)

## 2020-04-22 ENCOUNTER — Ambulatory Visit (INDEPENDENT_AMBULATORY_CARE_PROVIDER_SITE_OTHER): Payer: Medicare Other | Admitting: Cardiology

## 2020-04-22 DIAGNOSIS — Z79899 Other long term (current) drug therapy: Secondary | ICD-10-CM | POA: Diagnosis not present

## 2020-04-22 DIAGNOSIS — I480 Paroxysmal atrial fibrillation: Secondary | ICD-10-CM | POA: Diagnosis not present

## 2020-04-22 DIAGNOSIS — I482 Chronic atrial fibrillation, unspecified: Secondary | ICD-10-CM

## 2020-04-22 DIAGNOSIS — Z7901 Long term (current) use of anticoagulants: Secondary | ICD-10-CM | POA: Diagnosis not present

## 2020-04-22 LAB — POCT INR: INR: 3.5 — AB (ref 2.0–3.0)

## 2020-04-29 ENCOUNTER — Ambulatory Visit (INDEPENDENT_AMBULATORY_CARE_PROVIDER_SITE_OTHER): Payer: Medicare Other | Admitting: Family Medicine

## 2020-04-29 ENCOUNTER — Ambulatory Visit: Payer: Medicare Other | Admitting: Family Medicine

## 2020-04-29 ENCOUNTER — Ambulatory Visit (INDEPENDENT_AMBULATORY_CARE_PROVIDER_SITE_OTHER): Payer: Medicare Other | Admitting: Cardiology

## 2020-04-29 ENCOUNTER — Other Ambulatory Visit: Payer: Self-pay

## 2020-04-29 ENCOUNTER — Encounter: Payer: Self-pay | Admitting: Family Medicine

## 2020-04-29 VITALS — BP 178/90 | HR 90 | Temp 97.3°F | Ht 66.0 in | Wt 132.0 lb

## 2020-04-29 DIAGNOSIS — Z9181 History of falling: Secondary | ICD-10-CM

## 2020-04-29 DIAGNOSIS — R296 Repeated falls: Secondary | ICD-10-CM

## 2020-04-29 DIAGNOSIS — R531 Weakness: Secondary | ICD-10-CM

## 2020-04-29 DIAGNOSIS — Z79899 Other long term (current) drug therapy: Secondary | ICD-10-CM

## 2020-04-29 DIAGNOSIS — I482 Chronic atrial fibrillation, unspecified: Secondary | ICD-10-CM

## 2020-04-29 LAB — POCT INR: INR: 3.2 — AB (ref 2.0–3.0)

## 2020-04-29 NOTE — Progress Notes (Signed)
.   Established Patient Office Visit  Subjective:  [name], male    DOB: Jun 29, 1928  Age: 87 y.o. MRN: [id number]  CC:  Chief Complaint  Patient presents with  . Fall    HPI Larry Gates presents for frequent falls. He had two falls recently, about 1 month ago. He had 2 fractures in his pelvis as a result. He was seen with [facility] for thi. He lives at home alone and uses a walker and rolling walker at home to help ambulate. His son lives next door and checks on him often. He has not had a fall since. He is established with [facility] for other home health reasons and would like to have PT done at home.   Past Medical History:  Diagnosis Date  . Anticoagulated on Coumadin    a. Followed @ [facility]  . Atypical atrial flutter ([place])    a. Dx 07/2010 and coumadin initiated;  b. 08/2010 s/p DCCV.  . Cataract   . Hard of hearing   . Midsternal chest pain    a. In setting of A Flutter 07/2010;  b. Cath 07/2010: nonobs dzs  . Prostate cancer ([place])   . TIA (transient ischemic attack)   . Vertigo     Past Surgical History:  Procedure Laterality Date  . APPENDECTOMY    . CARDIOVERSION  10/14/2011   Procedure: CARDIOVERSION;  Surgeon: Lelon Perla, MD;  Location: [place];  Service: Cardiovascular;  Laterality: N/A;  . CARDIOVERSION N/A 07/24/2012   Procedure: CARDIOVERSION;  Surgeon: [name], MD;  Location: [place];  Service: Cardiovascular;  Laterality: N/A;  . EYE SURGERY Bilateral    cataract  . I & D EXTREMITY Left 05/04/2013   Procedure: IRRIGATION AND DEBRIDEMENT of left hand with repair of lacerations and revision amputation of index finger;  Surgeon: Roseanne Kaufman, MD;  Location: [place];  Service: Orthopedics;  Laterality: Left;  . TONSILLECTOMY      Family History  Problem Relation Age of Onset  . CVA Father   . Alzheimer's disease Father   . Leukemia Father   . Alzheimer's disease Mother   . Heart Problems Brother        stents in his 34's-80's  .  Early death Sister   . Early death Brother     Social History   Socioeconomic History  . Marital status: Widowed    Spouse name: Not on file  . Number of children: 1  . Years of education: 98  . Highest education level: 12th grade  Occupational History  . Occupation: Retired  Tobacco Use  . Smoking status: Never Smoker  . Smokeless tobacco: Never Used  Vaping Use  . Vaping Use: Never used  Substance and Sexual Activity  . Alcohol use: No  . Drug use: No  . Sexual activity: Never  Other Topics Concern  . Not on file  Social History Narrative   Lives in [place] by himself.  Children live nearby and check in on him daily.   Social Determinants of Health   Financial Resource Strain: Low Risk   . Difficulty of Paying Living Expenses: Not hard at all  Food Insecurity: No Food Insecurity  . Worried About Charity fundraiser in the Last Year: Never true  . Ran Out of Food in the Last Year: Never true  Transportation Needs: No Transportation Needs  . Lack of Transportation (Medical): No  . Lack of Transportation (Non-Medical): No  Physical  Activity: Sufficiently Active  . Days of Exercise per Week: 5 days  . Minutes of Exercise per Session: 40 min  Stress: No Stress Concern Present  . Feeling of Stress : Not at all  Social Connections: Moderately Integrated  . Frequency of Communication with Friends and Family: More than three times a week  . Frequency of Social Gatherings with Friends and Family: More than three times a week  . Attends Religious Services: More than 4 times per year  . Active Member of Clubs or Organizations: Yes  . Attends Archivist Meetings: More than 4 times per year  . Marital Status: Widowed  Intimate Partner Violence: Not At Risk  . Fear of Current or Ex-Partner: No  . Emotionally Abused: No  . Physically Abused: No  . Sexually Abused: No    Outpatient Medications Prior to Visit  Medication Sig Dispense Refill  . amiodarone  (PACERONE) 200 MG tablet TAKE 1 TABLET BY MOUTH ONCE DAILY . APPOINTMENT REQUIRED FOR FUTURE REFILLS 90 tablet 3  . DILT-XR 240 MG 24 hr capsule TAKE 1 CAPSULE BY MOUTH ONCE DAILY . APPOINTMENT REQUIRED FOR FUTURE REFILLS 90 capsule 3  . levothyroxine (EUTHYROX) 88 MCG tablet Take 1 tablet (88 mcg total) by mouth daily before breakfast. 30 tablet 11  . tamsulosin (FLOMAX) 0.4 MG CAPS capsule Take 0.4 mg by mouth daily.    [name] warfarin (COUMADIN) 2 MG tablet TAKE 1 TABLET BY MOUTH ONCE DAILY AS DIRECTED BY  COUMADIN  CLINIC 90 tablet 0  . furosemide (LASIX) 20 MG tablet Take 1 tablet (20 mg total) by mouth daily for two days. After the two days, take only AS NEEDED. 90 tablet 3  . losartan (COZAAR) 25 MG tablet Take 0.5 tablets (12.5 mg total) by mouth daily. 45 tablet 3   No facility-administered medications prior to visit.    Allergies  Allergen Reactions  . Penicillins Rash    ROS Review of Systems  As per HPI.    Objective:    Physical Exam Vitals and nursing note reviewed.  Constitutional:      General: He is not in acute distress.    Appearance: Normal appearance. He is not ill-appearing or toxic-appearing.  Eyes:     Extraocular Movements: Extraocular movements intact.     Pupils: Pupils are equal, round, and reactive to light.  Cardiovascular:     Rate and Rhythm: Normal rate and regular rhythm.     Heart sounds: Murmur (systolic) heard.    Pulmonary:     Effort: Pulmonary effort is normal. No respiratory distress.     Breath sounds: Normal breath sounds.  Musculoskeletal:     Right lower leg: No edema.     Left lower leg: No edema.  Skin:    General: Skin is warm and dry.  Neurological:     Mental Status: He is alert and oriented to person, place, and time. Mental status is at baseline.     Motor: Weakness (generalized) present.     Gait: Gait abnormal (uses cane).  Psychiatric:        Mood and Affect: Mood normal.        Behavior: Behavior normal.     BP  (!) 178/90   Pulse 90   Temp (!) 97.3 F (36.3 C) (Temporal)   Ht 5\' 6"  (1.676 m)   Wt 132 lb (59.9 kg)   BMI 21.31 kg/m  Wt Readings from Last 3 Encounters:  04/29/20 132 lb (59.9  kg)  11/29/19 122 lb 3.2 oz (55.4 kg)  08/21/19 128 lb (58.1 kg)     Health Maintenance Due  Topic Date Due  . COVID-19 Vaccine (3 - Booster for Moderna series) 12/18/2019    There are no preventive care reminders to display for this patient.  Lab Results  Component Value Date   TSH 3.280 11/29/2019   Lab Results  Component Value Date   WBC 5.5 11/29/2019   HGB 12.3 (L) 11/29/2019   HCT 33.9 (L) 11/29/2019   MCV 92 11/29/2019   PLT 149 (L) 11/29/2019   Lab Results  Component Value Date   NA 139 11/29/2019   K 4.7 11/29/2019   CO2 23 11/29/2019   GLUCOSE 195 (H) 11/29/2019   BUN 26 11/29/2019   CREATININE 1.54 (H) 11/29/2019   BILITOT 0.6 11/29/2019   ALKPHOS 100 11/29/2019   AST 15 11/29/2019   ALT 14 11/29/2019   PROT 5.9 (L) 11/29/2019   ALBUMIN 3.9 11/29/2019   CALCIUM 8.7 11/29/2019   ANIONGAP 10 05/26/2018   GFR 85.04 10/11/2011   Lab Results  Component Value Date   CHOL 146 02/14/2018   Lab Results  Component Value Date   HDL 57 02/14/2018   Lab Results  Component Value Date   LDLCALC 78 02/14/2018   Lab Results  Component Value Date   TRIG 57 02/14/2018   Lab Results  Component Value Date   CHOLHDL 2.6 02/14/2018   Lab Results  Component Value Date   HGBA1C 4.3 04/01/2016      Assessment & Plan:   Larry Gates was seen today for fall.  Diagnoses and all orders for this visit:  At high risk for falls/Frequent falls Discussed home safety measures. Handout given. Referral for [facility].  -     Ambulatory referral to Home Health  Generalized weakness Referral to [facility]. Handout given for fall prevention. At high risk for falls.  -     Ambulatory referral to Home Health  Follow-up: with PCP as needed.    Gwenlyn Perking, FNP

## 2020-04-29 NOTE — Patient Instructions (Signed)

## 2020-04-30 NOTE — Addendum Note (Signed)
Addended by: Gwenlyn Perking on: 04/30/2020 10:02 AM   Modules accepted: Level of Service

## 2020-05-06 ENCOUNTER — Ambulatory Visit (INDEPENDENT_AMBULATORY_CARE_PROVIDER_SITE_OTHER): Payer: Medicare Other | Admitting: Internal Medicine

## 2020-05-06 DIAGNOSIS — I482 Chronic atrial fibrillation, unspecified: Secondary | ICD-10-CM

## 2020-05-06 DIAGNOSIS — Z79899 Other long term (current) drug therapy: Secondary | ICD-10-CM | POA: Diagnosis not present

## 2020-05-06 LAB — POCT INR: INR: 2.6 (ref 2.0–3.0)

## 2020-05-07 DIAGNOSIS — I10 Essential (primary) hypertension: Secondary | ICD-10-CM | POA: Diagnosis not present

## 2020-05-07 DIAGNOSIS — Z602 Problems related to living alone: Secondary | ICD-10-CM | POA: Diagnosis not present

## 2020-05-07 DIAGNOSIS — I484 Atypical atrial flutter: Secondary | ICD-10-CM | POA: Diagnosis not present

## 2020-05-07 DIAGNOSIS — I4891 Unspecified atrial fibrillation: Secondary | ICD-10-CM | POA: Diagnosis not present

## 2020-05-07 DIAGNOSIS — R011 Cardiac murmur, unspecified: Secondary | ICD-10-CM | POA: Diagnosis not present

## 2020-05-07 DIAGNOSIS — H9193 Unspecified hearing loss, bilateral: Secondary | ICD-10-CM | POA: Diagnosis not present

## 2020-05-07 DIAGNOSIS — S329XXD Fracture of unspecified parts of lumbosacral spine and pelvis, subsequent encounter for fracture with routine healing: Secondary | ICD-10-CM | POA: Diagnosis not present

## 2020-05-07 DIAGNOSIS — Z8673 Personal history of transient ischemic attack (TIA), and cerebral infarction without residual deficits: Secondary | ICD-10-CM | POA: Diagnosis not present

## 2020-05-07 DIAGNOSIS — Z7901 Long term (current) use of anticoagulants: Secondary | ICD-10-CM | POA: Diagnosis not present

## 2020-05-07 DIAGNOSIS — C61 Malignant neoplasm of prostate: Secondary | ICD-10-CM | POA: Diagnosis not present

## 2020-05-07 DIAGNOSIS — Z9181 History of falling: Secondary | ICD-10-CM | POA: Diagnosis not present

## 2020-05-07 DIAGNOSIS — R42 Dizziness and giddiness: Secondary | ICD-10-CM | POA: Diagnosis not present

## 2020-05-13 ENCOUNTER — Ambulatory Visit (INDEPENDENT_AMBULATORY_CARE_PROVIDER_SITE_OTHER): Payer: Medicare Other | Admitting: Cardiovascular Disease

## 2020-05-13 DIAGNOSIS — Z79899 Other long term (current) drug therapy: Secondary | ICD-10-CM

## 2020-05-13 DIAGNOSIS — I482 Chronic atrial fibrillation, unspecified: Secondary | ICD-10-CM

## 2020-05-13 LAB — POCT INR: INR: 4 — AB (ref 2.0–3.0)

## 2020-05-18 ENCOUNTER — Other Ambulatory Visit: Payer: Self-pay

## 2020-05-18 ENCOUNTER — Ambulatory Visit (INDEPENDENT_AMBULATORY_CARE_PROVIDER_SITE_OTHER): Payer: Medicare Other

## 2020-05-18 DIAGNOSIS — S329XXD Fracture of unspecified parts of lumbosacral spine and pelvis, subsequent encounter for fracture with routine healing: Secondary | ICD-10-CM

## 2020-05-18 DIAGNOSIS — C61 Malignant neoplasm of prostate: Secondary | ICD-10-CM

## 2020-05-18 DIAGNOSIS — R011 Cardiac murmur, unspecified: Secondary | ICD-10-CM

## 2020-05-18 DIAGNOSIS — I484 Atypical atrial flutter: Secondary | ICD-10-CM | POA: Diagnosis not present

## 2020-05-18 DIAGNOSIS — I10 Essential (primary) hypertension: Secondary | ICD-10-CM | POA: Diagnosis not present

## 2020-05-18 DIAGNOSIS — I4891 Unspecified atrial fibrillation: Secondary | ICD-10-CM

## 2020-05-18 DIAGNOSIS — R42 Dizziness and giddiness: Secondary | ICD-10-CM

## 2020-05-18 DIAGNOSIS — Z8673 Personal history of transient ischemic attack (TIA), and cerebral infarction without residual deficits: Secondary | ICD-10-CM

## 2020-05-18 DIAGNOSIS — H9193 Unspecified hearing loss, bilateral: Secondary | ICD-10-CM

## 2020-05-18 DIAGNOSIS — Z602 Problems related to living alone: Secondary | ICD-10-CM

## 2020-05-18 DIAGNOSIS — Z7901 Long term (current) use of anticoagulants: Secondary | ICD-10-CM

## 2020-05-18 DIAGNOSIS — Z9181 History of falling: Secondary | ICD-10-CM

## 2020-05-20 DIAGNOSIS — Z7901 Long term (current) use of anticoagulants: Secondary | ICD-10-CM | POA: Diagnosis not present

## 2020-05-20 DIAGNOSIS — I480 Paroxysmal atrial fibrillation: Secondary | ICD-10-CM | POA: Diagnosis not present

## 2020-05-27 ENCOUNTER — Ambulatory Visit (INDEPENDENT_AMBULATORY_CARE_PROVIDER_SITE_OTHER): Payer: Medicare Other | Admitting: Cardiovascular Disease

## 2020-05-27 DIAGNOSIS — I482 Chronic atrial fibrillation, unspecified: Secondary | ICD-10-CM | POA: Diagnosis not present

## 2020-05-27 DIAGNOSIS — Z79899 Other long term (current) drug therapy: Secondary | ICD-10-CM

## 2020-05-27 LAB — POCT INR: INR: 3 (ref 2.0–3.0)

## 2020-06-02 ENCOUNTER — Ambulatory Visit (INDEPENDENT_AMBULATORY_CARE_PROVIDER_SITE_OTHER): Payer: Medicare Other | Admitting: Family Medicine

## 2020-06-02 ENCOUNTER — Other Ambulatory Visit: Payer: Self-pay

## 2020-06-02 ENCOUNTER — Encounter: Payer: Self-pay | Admitting: Family Medicine

## 2020-06-02 VITALS — BP 165/78 | HR 76 | Temp 96.1°F | Resp 20 | Ht 66.0 in | Wt 126.1 lb

## 2020-06-02 DIAGNOSIS — I482 Chronic atrial fibrillation, unspecified: Secondary | ICD-10-CM | POA: Diagnosis not present

## 2020-06-02 DIAGNOSIS — E039 Hypothyroidism, unspecified: Secondary | ICD-10-CM

## 2020-06-02 DIAGNOSIS — Z7901 Long term (current) use of anticoagulants: Secondary | ICD-10-CM | POA: Diagnosis not present

## 2020-06-02 DIAGNOSIS — I10 Essential (primary) hypertension: Secondary | ICD-10-CM

## 2020-06-02 LAB — CBC WITH DIFFERENTIAL/PLATELET
Basophils Absolute: 0.1 10*3/uL (ref 0.0–0.2)
Basos: 1 %
EOS (ABSOLUTE): 0.3 10*3/uL (ref 0.0–0.4)
Eos: 5 %
Hematocrit: 35.7 % — ABNORMAL LOW (ref 37.5–51.0)
Hemoglobin: 12.9 g/dL — ABNORMAL LOW (ref 13.0–17.7)
Immature Grans (Abs): 0 10*3/uL (ref 0.0–0.1)
Immature Granulocytes: 0 %
Lymphocytes Absolute: 0.6 10*3/uL — ABNORMAL LOW (ref 0.7–3.1)
Lymphs: 11 %
MCH: 31.6 pg (ref 26.6–33.0)
MCHC: 36.1 g/dL — ABNORMAL HIGH (ref 31.5–35.7)
MCV: 88 fL (ref 79–97)
Monocytes Absolute: 0.5 10*3/uL (ref 0.1–0.9)
Monocytes: 8 %
Neutrophils Absolute: 4.2 10*3/uL (ref 1.4–7.0)
Neutrophils: 75 %
Platelets: 196 10*3/uL (ref 150–450)
RBC: 4.08 x10E6/uL — ABNORMAL LOW (ref 4.14–5.80)
RDW: 13.7 % (ref 11.6–15.4)
WBC: 5.7 10*3/uL (ref 3.4–10.8)

## 2020-06-02 LAB — CMP14+EGFR
ALT: 11 IU/L (ref 0–44)
AST: 16 IU/L (ref 0–40)
Albumin/Globulin Ratio: 1.9 (ref 1.2–2.2)
Albumin: 3.8 g/dL (ref 3.6–4.6)
Alkaline Phosphatase: 153 IU/L — ABNORMAL HIGH (ref 44–121)
BUN/Creatinine Ratio: 16 (ref 10–24)
BUN: 23 mg/dL (ref 8–27)
Bilirubin Total: 0.7 mg/dL (ref 0.0–1.2)
CO2: 21 mmol/L (ref 20–29)
Calcium: 8.7 mg/dL (ref 8.6–10.2)
Chloride: 105 mmol/L (ref 96–106)
Creatinine, Ser: 1.47 mg/dL — ABNORMAL HIGH (ref 0.76–1.27)
GFR calc Af Amer: 48 mL/min/{1.73_m2} — ABNORMAL LOW (ref >59)
GFR calc non Af Amer: 42 mL/min/{1.73_m2} — ABNORMAL LOW (ref >59)
Globulin, Total: 2 g/dL (ref 1.5–4.5)
Glucose: 185 mg/dL — ABNORMAL HIGH (ref 65–99)
Potassium: 4 mmol/L (ref 3.5–5.2)
Sodium: 142 mmol/L (ref 134–144)
Total Protein: 5.8 g/dL — ABNORMAL LOW (ref 6.0–8.5)

## 2020-06-02 LAB — LIPID PANEL
Chol/HDL Ratio: 2.4 ratio (ref 0.0–5.0)
Cholesterol, Total: 116 mg/dL (ref 100–199)
HDL: 48 mg/dL (ref >39)
LDL Chol Calc (NIH): 55 mg/dL (ref 0–99)
Triglycerides: 60 mg/dL (ref 0–149)
VLDL Cholesterol Cal: 13 mg/dL (ref 5–40)

## 2020-06-02 NOTE — Progress Notes (Signed)
Subjective:  [name], male    DOB: [birth year]  Age: 87 y.o. MRN: [id number]  CC: Medical Management of Chronic Issues   HPI SIRIS HOOS presents for  follow-up on  thyroid. The patient has a history of hypothyroidism for many years. It has been stable recently. Pt. denies any change in  voice, loss of hair, heat or cold intolerance. Energy level has been adequate to good. Patient denies constipation and diarrhea. No myxedema. Medication is as noted below. Verified that pt is taking it daily on an empty stomach. Well tolerated.  Atrial fibrillation follow up. Pt. is treated with rate control and anticoagulation. Pt.  denies palpitations, rapid rate, chest pain, dyspnea and edema. There has been no bleeding from nose or gums. Pt. has not noticed blood with urine or stool.  Although there is routine bruising easily, it is not excessive. Home coumadin checks called in weekly to [facility].  Depression screen [facility] 2/9 06/02/2020 11/29/2019 08/02/2019  Decreased Interest 0 0 0  Down, Depressed, Hopeless 0 0 0  PHQ - 2 Score 0 0 0  Some recent data might be hidden    History Emilian has a past medical history of Anticoagulated on Coumadin, Atypical atrial flutter ([place]), Cataract, Hard of hearing, Midsternal chest pain, Prostate cancer ([place]), TIA (transient ischemic attack), and Vertigo.   He has a past surgical history that includes Appendectomy; Tonsillectomy; Cardioversion (10/14/2011); Cardioversion (N/A, 07/24/2012); I & D extremity (Left, 05/04/2013); and Eye surgery (Bilateral).   His family history includes Alzheimer's disease in his father and mother; CVA in his father; Early death in his brother and sister; Heart Problems in his brother; Leukemia in his father.He reports that he has never smoked. He has never used smokeless tobacco. He reports that he does not drink alcohol and does not use drugs.    ROS Review of Systems  Constitutional: Negative for fever.  Respiratory:  Negative for shortness of breath.   Cardiovascular: Negative for chest pain.  Genitourinary: Positive for frequency (nocturia X 2-3. Not a problem for him currently). Negative for difficulty urinating and dysuria.  Musculoskeletal: Negative for arthralgias.  Skin: Negative for rash.    Objective:  BP (!) 165/78   Pulse 76   Temp (!) 96.1 F (35.6 C) (Temporal)   Resp 20   Ht 5\' 6"  (1.676 m)   Wt 126 lb 2 oz (57.2 kg)   SpO2 95%   BMI 20.36 kg/m   BP Readings from Last 3 Encounters:  06/02/20 (!) 165/78  04/29/20 (!) 178/90  11/29/19 132/60    Wt Readings from Last 3 Encounters:  06/02/20 126 lb 2 oz (57.2 kg)  04/29/20 132 lb (59.9 kg)  11/29/19 122 lb 3.2 oz (55.4 kg)     Physical Exam Vitals reviewed.  Constitutional:      Appearance: He is well-developed and well-nourished.  HENT:     Head: Normocephalic and atraumatic.     Right Ear: External ear normal.     Left Ear: External ear normal.     Mouth/Throat:     Pharynx: No oropharyngeal exudate or posterior oropharyngeal erythema.  Eyes:     Pupils: Pupils are equal, round, and reactive to light.  Cardiovascular:     Rate and Rhythm: Normal rate and regular rhythm.     Heart sounds: Murmur heard.    Pulmonary:     Effort: No respiratory distress.     Breath sounds: Normal breath sounds.  Musculoskeletal:  Cervical back: Normal range of motion and neck supple.  Neurological:     Mental Status: He is alert and oriented to person, place, and time.       Assessment & Plan:   Tallan was seen today for medical management of chronic issues.  Diagnoses and all orders for this visit:  Atrial fibrillation, chronic ([place])  Hypothyroidism, unspecified type  Essential hypertension  Long term current use of anticoagulant therapy       I am having Carloyn Manner B. Petrovich maintain his Dilt-XR, levothyroxine, warfarin, amiodarone, and tamsulosin.  Allergies as of 06/02/2020      Reactions   Penicillins Rash       Medication List       Accurate as of June 02, 2020  8:32 AM. If you have any questions, ask your nurse or doctor.        amiodarone 200 MG tablet Commonly known as: PACERONE TAKE 1 TABLET BY MOUTH ONCE DAILY . APPOINTMENT REQUIRED FOR FUTURE REFILLS   Dilt-XR 240 MG 24 hr capsule Generic drug: diltiazem TAKE 1 CAPSULE BY MOUTH ONCE DAILY . APPOINTMENT REQUIRED FOR FUTURE REFILLS   levothyroxine 88 MCG tablet Commonly known as: Euthyrox Take 1 tablet (88 mcg total) by mouth daily before breakfast.   tamsulosin 0.4 MG Caps capsule Commonly known as: FLOMAX Take 0.4 mg by mouth daily.   warfarin 2 MG tablet Commonly known as: COUMADIN Take as directed by the anticoagulation clinic. If you are unsure how to take this medication, talk to your nurse or doctor. Original instructions: TAKE 1 TABLET BY MOUTH ONCE DAILY AS DIRECTED BY  COUMADIN  CLINIC        Follow-up: No follow-ups on file.  Claretta Fraise, M.D.

## 2020-06-03 NOTE — Progress Notes (Signed)
Hello Larry Gates,  Your lab result is normal and/or stable.Some minor variations that are not significant are commonly marked abnormal, but do not represent any medical problem for you.  Best regards, Barnet Benavides, M.D.

## 2020-06-04 ENCOUNTER — Ambulatory Visit (INDEPENDENT_AMBULATORY_CARE_PROVIDER_SITE_OTHER): Payer: Medicare Other | Admitting: Cardiology

## 2020-06-04 DIAGNOSIS — I482 Chronic atrial fibrillation, unspecified: Secondary | ICD-10-CM

## 2020-06-04 DIAGNOSIS — Z79899 Other long term (current) drug therapy: Secondary | ICD-10-CM | POA: Diagnosis not present

## 2020-06-04 DIAGNOSIS — I4892 Unspecified atrial flutter: Secondary | ICD-10-CM

## 2020-06-04 LAB — POCT INR: INR: 3 (ref 2.0–3.0)

## 2020-06-06 DIAGNOSIS — H9193 Unspecified hearing loss, bilateral: Secondary | ICD-10-CM | POA: Diagnosis not present

## 2020-06-06 DIAGNOSIS — Z602 Problems related to living alone: Secondary | ICD-10-CM | POA: Diagnosis not present

## 2020-06-06 DIAGNOSIS — Z8673 Personal history of transient ischemic attack (TIA), and cerebral infarction without residual deficits: Secondary | ICD-10-CM | POA: Diagnosis not present

## 2020-06-06 DIAGNOSIS — I4891 Unspecified atrial fibrillation: Secondary | ICD-10-CM | POA: Diagnosis not present

## 2020-06-06 DIAGNOSIS — S329XXD Fracture of unspecified parts of lumbosacral spine and pelvis, subsequent encounter for fracture with routine healing: Secondary | ICD-10-CM | POA: Diagnosis not present

## 2020-06-06 DIAGNOSIS — I10 Essential (primary) hypertension: Secondary | ICD-10-CM | POA: Diagnosis not present

## 2020-06-06 DIAGNOSIS — Z7901 Long term (current) use of anticoagulants: Secondary | ICD-10-CM | POA: Diagnosis not present

## 2020-06-06 DIAGNOSIS — Z9181 History of falling: Secondary | ICD-10-CM | POA: Diagnosis not present

## 2020-06-06 DIAGNOSIS — C61 Malignant neoplasm of prostate: Secondary | ICD-10-CM | POA: Diagnosis not present

## 2020-06-06 DIAGNOSIS — R42 Dizziness and giddiness: Secondary | ICD-10-CM | POA: Diagnosis not present

## 2020-06-06 DIAGNOSIS — R011 Cardiac murmur, unspecified: Secondary | ICD-10-CM | POA: Diagnosis not present

## 2020-06-06 DIAGNOSIS — I484 Atypical atrial flutter: Secondary | ICD-10-CM | POA: Diagnosis not present

## 2020-06-10 ENCOUNTER — Ambulatory Visit (INDEPENDENT_AMBULATORY_CARE_PROVIDER_SITE_OTHER): Payer: Medicare Other | Admitting: Cardiovascular Disease

## 2020-06-10 DIAGNOSIS — Z79899 Other long term (current) drug therapy: Secondary | ICD-10-CM | POA: Diagnosis not present

## 2020-06-10 DIAGNOSIS — I482 Chronic atrial fibrillation, unspecified: Secondary | ICD-10-CM

## 2020-06-10 LAB — POCT INR: INR: 3 (ref 2.0–3.0)

## 2020-06-17 DIAGNOSIS — I480 Paroxysmal atrial fibrillation: Secondary | ICD-10-CM | POA: Diagnosis not present

## 2020-06-17 DIAGNOSIS — Z7901 Long term (current) use of anticoagulants: Secondary | ICD-10-CM | POA: Diagnosis not present

## 2020-06-17 LAB — POCT INR: INR: 3.1 — AB (ref 2.0–3.0)

## 2020-06-18 ENCOUNTER — Ambulatory Visit (INDEPENDENT_AMBULATORY_CARE_PROVIDER_SITE_OTHER): Payer: Medicare Other | Admitting: Cardiovascular Disease

## 2020-06-18 DIAGNOSIS — Z79899 Other long term (current) drug therapy: Secondary | ICD-10-CM | POA: Diagnosis not present

## 2020-06-18 DIAGNOSIS — I482 Chronic atrial fibrillation, unspecified: Secondary | ICD-10-CM | POA: Diagnosis not present

## 2020-06-18 DIAGNOSIS — C44229 Squamous cell carcinoma of skin of left ear and external auricular canal: Secondary | ICD-10-CM | POA: Diagnosis not present

## 2020-06-18 DIAGNOSIS — D0439 Carcinoma in situ of skin of other parts of face: Secondary | ICD-10-CM | POA: Diagnosis not present

## 2020-06-18 DIAGNOSIS — I4892 Unspecified atrial flutter: Secondary | ICD-10-CM

## 2020-06-18 DIAGNOSIS — L218 Other seborrheic dermatitis: Secondary | ICD-10-CM | POA: Diagnosis not present

## 2020-06-24 ENCOUNTER — Ambulatory Visit (INDEPENDENT_AMBULATORY_CARE_PROVIDER_SITE_OTHER): Payer: Medicare Other | Admitting: Cardiology

## 2020-06-24 DIAGNOSIS — Z7901 Long term (current) use of anticoagulants: Secondary | ICD-10-CM

## 2020-06-24 DIAGNOSIS — I482 Chronic atrial fibrillation, unspecified: Secondary | ICD-10-CM

## 2020-06-24 DIAGNOSIS — Z79899 Other long term (current) drug therapy: Secondary | ICD-10-CM

## 2020-06-24 LAB — POCT INR: INR: 1.8 — AB (ref 2.0–3.0)

## 2020-07-01 ENCOUNTER — Ambulatory Visit (INDEPENDENT_AMBULATORY_CARE_PROVIDER_SITE_OTHER): Payer: Medicare Other | Admitting: Cardiovascular Disease

## 2020-07-01 DIAGNOSIS — I482 Chronic atrial fibrillation, unspecified: Secondary | ICD-10-CM | POA: Diagnosis not present

## 2020-07-01 DIAGNOSIS — Z79899 Other long term (current) drug therapy: Secondary | ICD-10-CM

## 2020-07-01 LAB — POCT INR: INR: 2.7 (ref 2.0–3.0)

## 2020-07-07 IMAGING — CR DG CHEST 1V PORT
1 series · 1 of 1 positions shown · non-contrast
Comparison: 05/22/2018

CLINICAL DATA: Follow-up pneumonia

EXAM:
PORTABLE CHEST 1 VIEW

[portable]
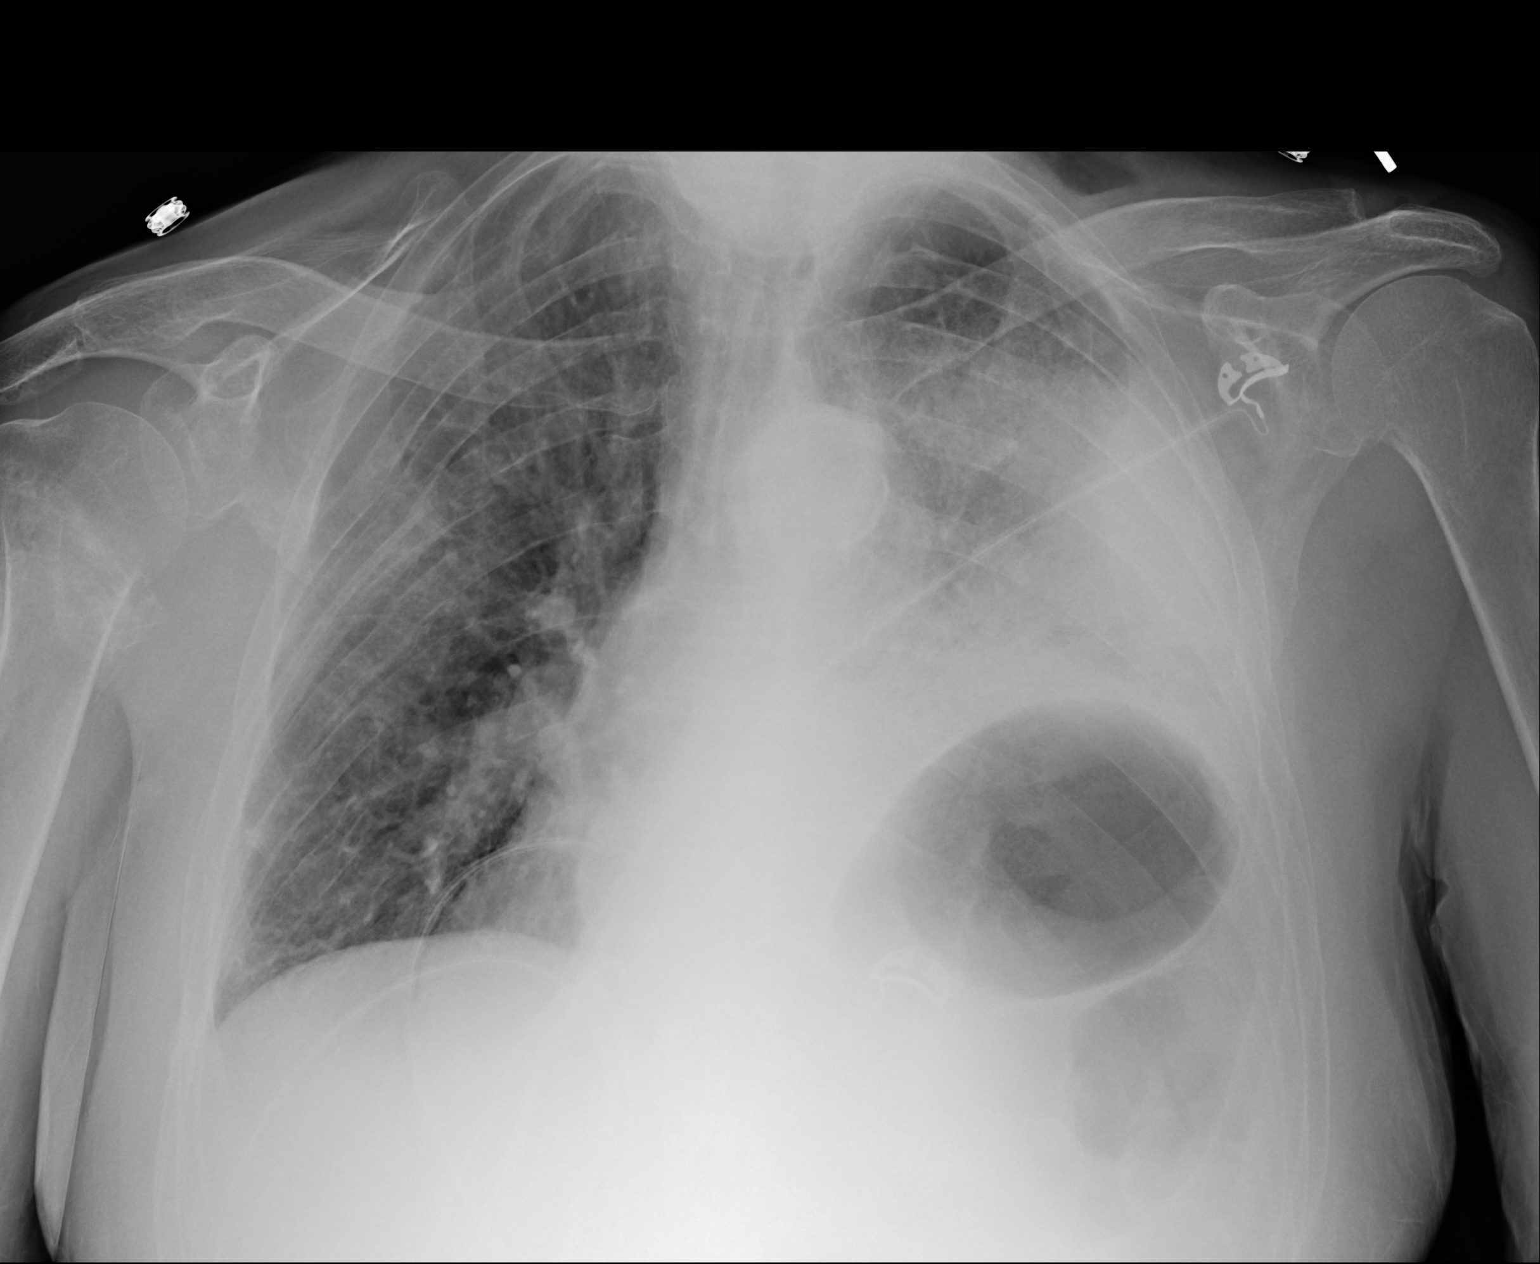

[1 of 1 positions shown; findings below may reference images not displayed]

FINDINGS: Check shadow is stable. Persistent elevation of the left
hemidiaphragm is noted with left basilar infiltrate. The overall
appearance is stable. Old rib fractures are noted on the right. No
focal infiltrate on the right is seen.
IMPRESSION: Stable left-sided infiltrate.

## 2020-07-08 ENCOUNTER — Ambulatory Visit (INDEPENDENT_AMBULATORY_CARE_PROVIDER_SITE_OTHER): Payer: Medicare Other | Admitting: Cardiology

## 2020-07-08 DIAGNOSIS — Z79899 Other long term (current) drug therapy: Secondary | ICD-10-CM

## 2020-07-08 DIAGNOSIS — I482 Chronic atrial fibrillation, unspecified: Secondary | ICD-10-CM | POA: Diagnosis not present

## 2020-07-08 LAB — POCT INR: INR: 3 (ref 2.0–3.0)

## 2020-07-08 IMAGING — CR DG CHEST 1V PORT
1 series · 1 of 1 positions shown · non-contrast
Comparison: 05/24/2018

CLINICAL DATA: Pneumonia, shortness of breath

EXAM:
PORTABLE CHEST 1 VIEW

[portable]
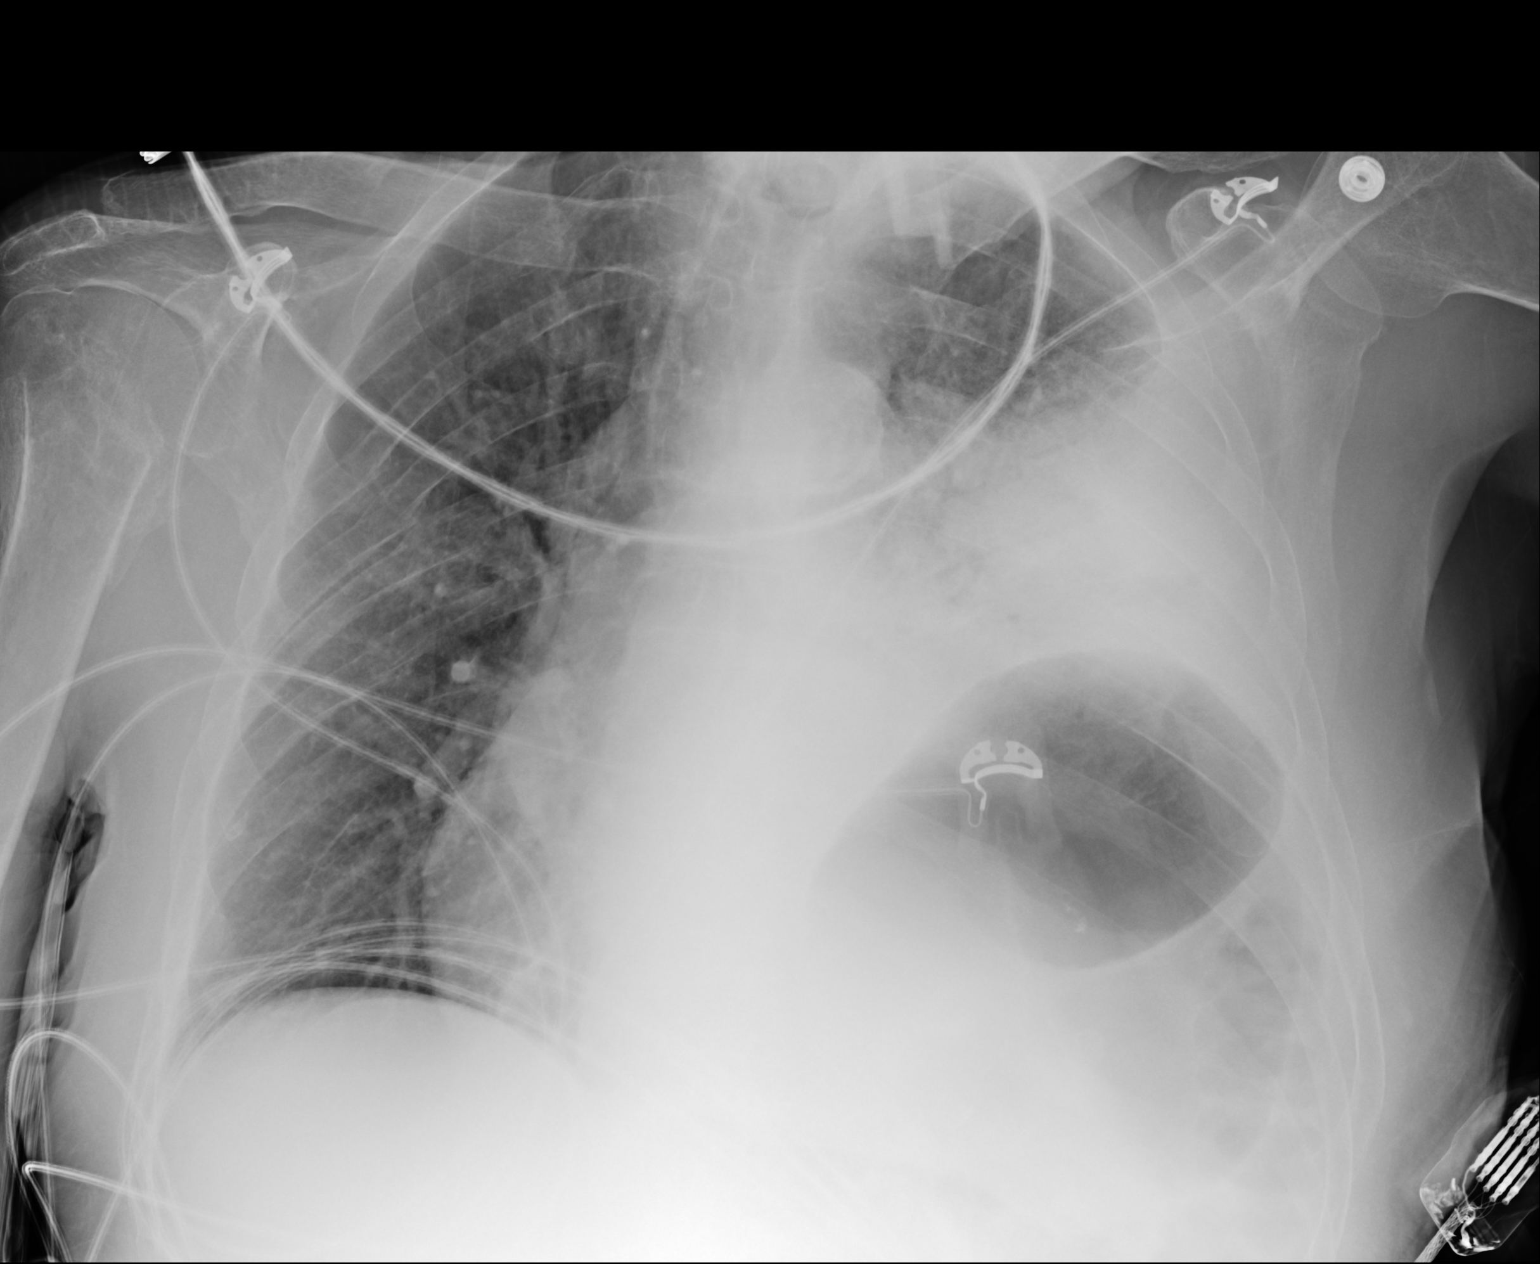

[1 of 1 positions shown; findings below may reference images not displayed]

FINDINGS: Stable AP portable examination with very dense consolidation of the
left lung and volume loss with elevation of the left hemidiaphragm.
No new airspace opacity. The right lung is normally aerated. The
cardiomediastinal silhouette is largely obscured.
IMPRESSION: Stable AP portable examination with very dense consolidation of the
left lung and volume loss with elevation of the left hemidiaphragm.
No new airspace opacity. The right lung is normally aerated. The
cardiomediastinal silhouette is largely obscured.

## 2020-07-09 IMAGING — CR DG CHEST 1V PORT
1 series · 1 of 1 positions shown · non-contrast
Comparison: 05/25/2018

CLINICAL DATA: Pneumonia.

EXAM:
PORTABLE CHEST 1 VIEW

[portable]
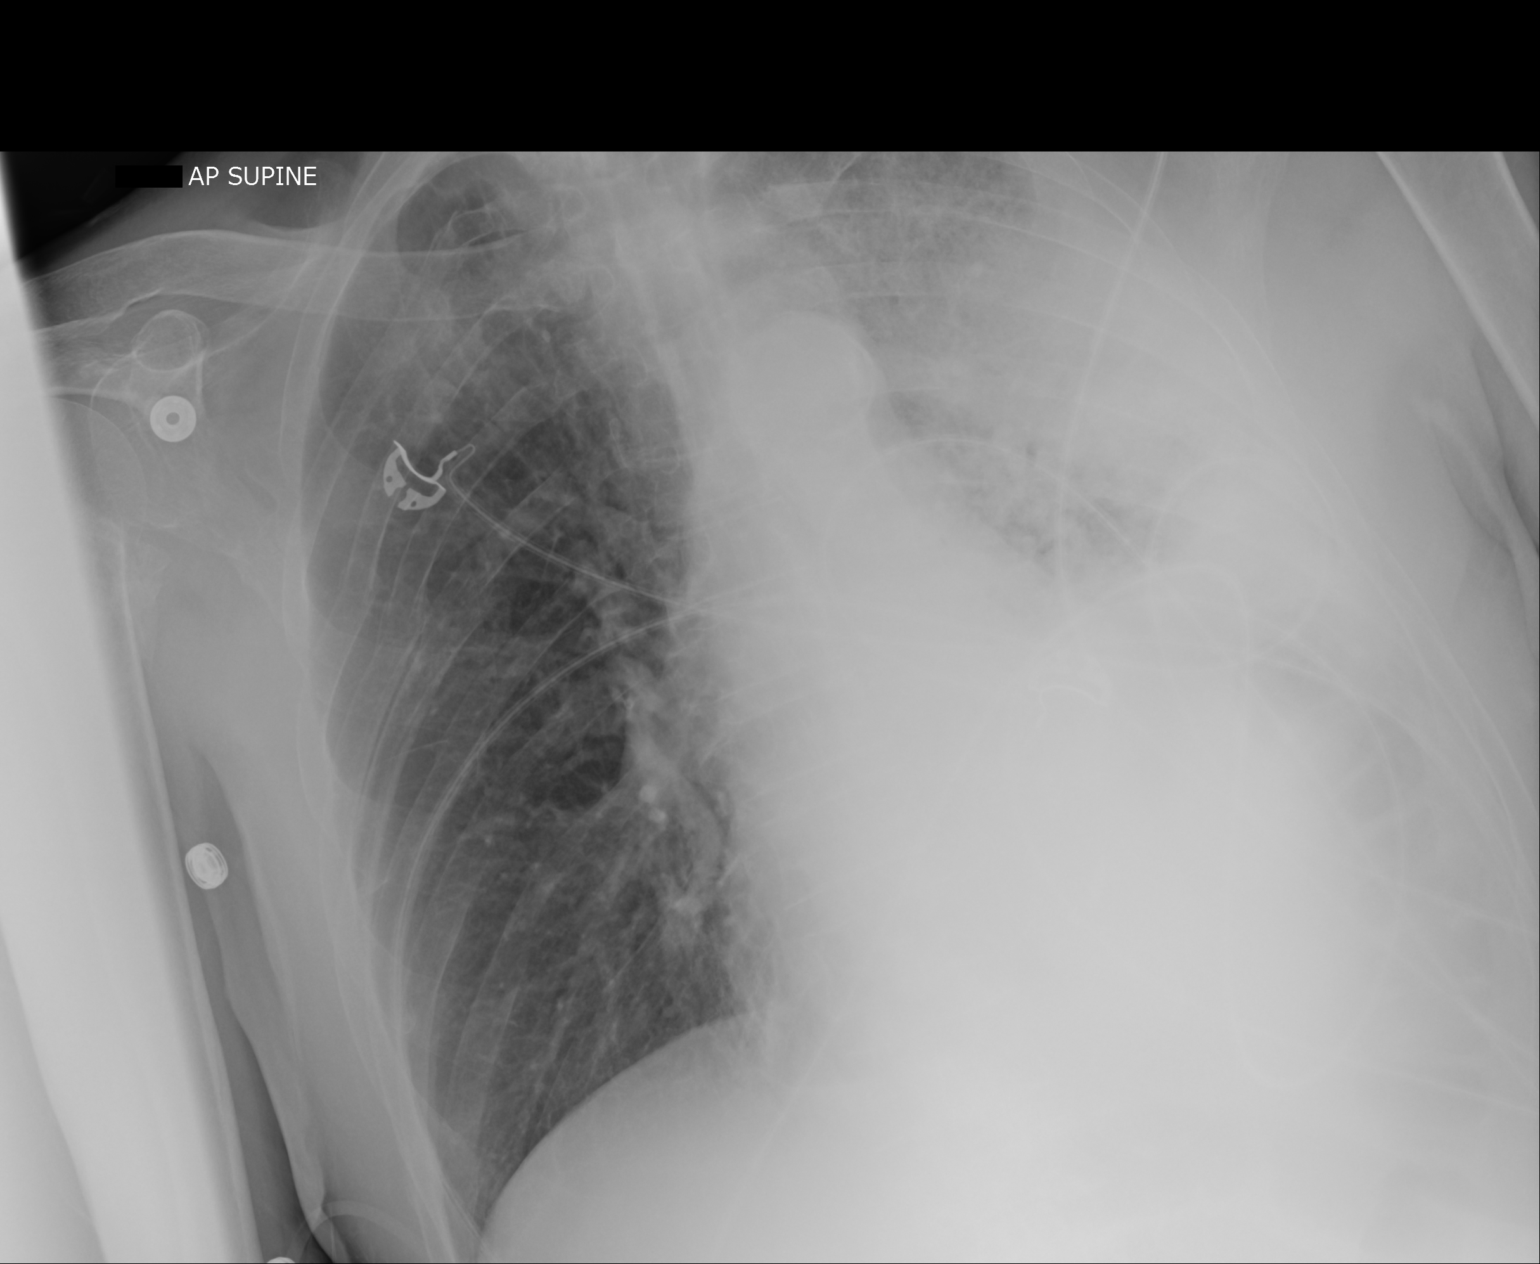

[1 of 1 positions shown; findings below may reference images not displayed]

FINDINGS: The cardiac silhouette, mediastinal and hilar contours are stable.
There is mild tortuosity and calcification of the thoracic aorta.
Persistent marked eventration of the left hemidiaphragm. Persistent
dense left upper lobe airspace process, likely pneumonia. Patchy
right upper lobe density could reflect a new or developing
infiltrate. The right lung is otherwise clear.
IMPRESSION: Persistent dense airspace process in the left lung, likely in a.

Suspect new patchy right upper lobe infiltrate.

## 2020-07-15 ENCOUNTER — Ambulatory Visit (INDEPENDENT_AMBULATORY_CARE_PROVIDER_SITE_OTHER): Payer: Medicare Other | Admitting: Cardiovascular Disease

## 2020-07-15 DIAGNOSIS — Z79899 Other long term (current) drug therapy: Secondary | ICD-10-CM

## 2020-07-15 DIAGNOSIS — I482 Chronic atrial fibrillation, unspecified: Secondary | ICD-10-CM | POA: Diagnosis not present

## 2020-07-15 DIAGNOSIS — Z7901 Long term (current) use of anticoagulants: Secondary | ICD-10-CM | POA: Diagnosis not present

## 2020-07-15 DIAGNOSIS — I480 Paroxysmal atrial fibrillation: Secondary | ICD-10-CM | POA: Diagnosis not present

## 2020-07-15 LAB — POCT INR: INR: 3.2 — AB (ref 2.0–3.0)

## 2020-07-22 ENCOUNTER — Other Ambulatory Visit: Payer: Self-pay | Admitting: Cardiology

## 2020-07-22 ENCOUNTER — Ambulatory Visit (INDEPENDENT_AMBULATORY_CARE_PROVIDER_SITE_OTHER): Payer: Medicare Other | Admitting: Cardiology

## 2020-07-22 DIAGNOSIS — I482 Chronic atrial fibrillation, unspecified: Secondary | ICD-10-CM

## 2020-07-22 DIAGNOSIS — Z79899 Other long term (current) drug therapy: Secondary | ICD-10-CM

## 2020-07-22 DIAGNOSIS — Z7901 Long term (current) use of anticoagulants: Secondary | ICD-10-CM | POA: Diagnosis not present

## 2020-07-22 LAB — POCT INR: INR: 1.9 — AB (ref 2.0–3.0)

## 2020-07-23 DIAGNOSIS — L570 Actinic keratosis: Secondary | ICD-10-CM | POA: Diagnosis not present

## 2020-07-23 DIAGNOSIS — Z08 Encounter for follow-up examination after completed treatment for malignant neoplasm: Secondary | ICD-10-CM | POA: Diagnosis not present

## 2020-07-23 DIAGNOSIS — Z85828 Personal history of other malignant neoplasm of skin: Secondary | ICD-10-CM | POA: Diagnosis not present

## 2020-07-23 DIAGNOSIS — L821 Other seborrheic keratosis: Secondary | ICD-10-CM | POA: Diagnosis not present

## 2020-07-29 ENCOUNTER — Ambulatory Visit (INDEPENDENT_AMBULATORY_CARE_PROVIDER_SITE_OTHER): Payer: Medicare Other | Admitting: Cardiology

## 2020-07-29 DIAGNOSIS — I482 Chronic atrial fibrillation, unspecified: Secondary | ICD-10-CM

## 2020-07-29 DIAGNOSIS — Z7901 Long term (current) use of anticoagulants: Secondary | ICD-10-CM

## 2020-07-29 DIAGNOSIS — Z79899 Other long term (current) drug therapy: Secondary | ICD-10-CM

## 2020-07-29 LAB — POCT INR: INR: 2.6 (ref 2.0–3.0)

## 2020-07-31 DIAGNOSIS — H2513 Age-related nuclear cataract, bilateral: Secondary | ICD-10-CM | POA: Diagnosis not present

## 2020-07-31 DIAGNOSIS — H40033 Anatomical narrow angle, bilateral: Secondary | ICD-10-CM | POA: Diagnosis not present

## 2020-08-03 ENCOUNTER — Ambulatory Visit (INDEPENDENT_AMBULATORY_CARE_PROVIDER_SITE_OTHER): Payer: Medicare Other

## 2020-08-03 VITALS — Ht 66.0 in | Wt 126.0 lb

## 2020-08-03 DIAGNOSIS — Z0000 Encounter for general adult medical examination without abnormal findings: Secondary | ICD-10-CM

## 2020-08-03 NOTE — Patient Instructions (Signed)
[name] , Thank you for taking time to come for your Medicare Wellness Visit. I appreciate your ongoing commitment to your health goals. Please review the following plan we discussed and let me know if I can assist you in the future.   Screening recommendations/referrals: Colonoscopy: No longer required Recommended yearly ophthalmology/optometry visit for glaucoma screening and checkup Recommended yearly dental visit for hygiene and checkup  Vaccinations: Influenza vaccine: Done 02/04/2020 - Repeat annually Pneumococcal vaccine: Done 04/06/2005 & 08/29/2014 Tdap vaccine: Done 05/04/2013 - Repeat every 10 years Shingles vaccine: Shingrix discussed. Please contact your pharmacy for coverage information.    Covid-19: Done 05/20/2019 & 06/17/2019 - Due for booster  Advanced directives: Please bring a copy of your health care power of attorney and living will to the office to be added to your chart at your convenience.  Conditions/risks identified: Keep up the great work! Continue fall prevention and walking every day; Eat plenty of fruits and vegetables and drink 6-8 glasses of water daily.  Next appointment: Follow up in one year for your annual wellness visit.   Preventive Care 85 Years and Older, Male  Preventive care refers to lifestyle choices and visits with your health care provider that can promote health and wellness. What does preventive care include?  A yearly physical exam. This is also called an annual well check.  Dental exams once or twice a year.  Routine eye exams. Ask your health care provider how often you should have your eyes checked.  Personal lifestyle choices, including:  Daily care of your teeth and gums.  Regular physical activity.  Eating a healthy diet.  Avoiding tobacco and drug use.  Limiting alcohol use.  Practicing safe sex.  Taking low doses of aspirin every day.  Taking vitamin and mineral supplements as recommended by your health care  provider. What happens during an annual well check? The services and screenings done by your health care provider during your annual well check will depend on your age, overall health, lifestyle risk factors, and family history of disease. Counseling  Your health care provider may ask you questions about your:  Alcohol use.  Tobacco use.  Drug use.  Emotional well-being.  Home and relationship well-being.  Sexual activity.  Eating habits.  History of falls.  Memory and ability to understand (cognition).  Work and work Statistician. Screening  You may have the following tests or measurements:  Height, weight, and BMI.  Blood pressure.  Lipid and cholesterol levels. These may be checked every 5 years, or more frequently if you are over 85 years old.  Skin check.  Lung cancer screening. You may have this screening every year starting at age 85 if you have a 30-pack-year history of smoking and currently smoke or have quit within the past 15 years.  Fecal occult blood test (FOBT) of the stool. You may have this test every year starting at age 85.  Flexible sigmoidoscopy or colonoscopy. You may have a sigmoidoscopy every 5 years or a colonoscopy every 10 years starting at age 85.  Prostate cancer screening. Recommendations will vary depending on your family history and other risks.  Hepatitis C blood test.  Hepatitis B blood test.  Sexually transmitted disease (STD) testing.  Diabetes screening. This is done by checking your blood sugar (glucose) after you have not eaten for a while (fasting). You may have this done every 1-3 years.  Abdominal aortic aneurysm (AAA) screening. You may need this if you are a current or former  smoker.  Osteoporosis. You may be screened starting at age 26 if you are at high risk. Talk with your health care provider about your test results, treatment options, and if necessary, the need for more tests. Vaccines  Your health care provider  may recommend certain vaccines, such as:  Influenza vaccine. This is recommended every year.  Tetanus, diphtheria, and acellular pertussis (Tdap, Td) vaccine. You may need a Td booster every 10 years.  Zoster vaccine. You may need this after age 40.  Pneumococcal 13-valent conjugate (PCV13) vaccine. One dose is recommended after age 85.  Pneumococcal polysaccharide (PPSV23) vaccine. One dose is recommended after age 85. Talk to your health care provider about which screenings and vaccines you need and how often you need them. This information is not intended to replace advice given to you by your health care provider. Make sure you discuss any questions you have with your health care provider. Document Released: 05/01/2015 Document Revised: 12/23/2015 Document Reviewed: 02/03/2015 Elsevier Interactive Patient Education  2017 [employer] Prevention in the Home Falls can cause injuries. They can happen to people of all ages. There are many things you can do to make your home safe and to help prevent falls. What can I do on the outside of my home?  Regularly fix the edges of walkways and driveways and fix any cracks.  Remove anything that might make you trip as you walk through a door, such as a raised step or threshold.  Trim any bushes or trees on the path to your home.  Use bright outdoor lighting.  Clear any walking paths of anything that might make someone trip, such as rocks or tools.  Regularly check to see if handrails are loose or broken. Make sure that both sides of any steps have handrails.  Any raised decks and porches should have guardrails on the edges.  Have any leaves, snow, or ice cleared regularly.  Use sand or salt on walking paths during winter.  Clean up any spills in your garage right away. This includes oil or grease spills. What can I do in the bathroom?  Use night lights.  Install grab bars by the toilet and in the tub and shower. Do not use  towel bars as grab bars.  Use non-skid mats or decals in the tub or shower.  If you need to sit down in the shower, use a plastic, non-slip stool.  Keep the floor dry. Clean up any water that spills on the floor as soon as it happens.  Remove soap buildup in the tub or shower regularly.  Attach bath mats securely with double-sided non-slip rug tape.  Do not have throw rugs and other things on the floor that can make you trip. What can I do in the bedroom?  Use night lights.  Make sure that you have a light by your bed that is easy to reach.  Do not use any sheets or blankets that are too big for your bed. They should not hang down onto the floor.  Have a firm chair that has side arms. You can use this for support while you get dressed.  Do not have throw rugs and other things on the floor that can make you trip. What can I do in the kitchen?  Clean up any spills right away.  Avoid walking on wet floors.  Keep items that you use a lot in easy-to-reach places.  If you need to reach something above you, use a  strong step stool that has a grab bar.  Keep electrical cords out of the way.  Do not use floor polish or wax that makes floors slippery. If you must use wax, use non-skid floor wax.  Do not have throw rugs and other things on the floor that can make you trip. What can I do with my stairs?  Do not leave any items on the stairs.  Make sure that there are handrails on both sides of the stairs and use them. Fix handrails that are broken or loose. Make sure that handrails are as long as the stairways.  Check any carpeting to make sure that it is firmly attached to the stairs. Fix any carpet that is loose or worn.  Avoid having throw rugs at the top or bottom of the stairs. If you do have throw rugs, attach them to the floor with carpet tape.  Make sure that you have a light switch at the top of the stairs and the bottom of the stairs. If you do not have them, ask  someone to add them for you. What else can I do to help prevent falls?  Wear shoes that:  Do not have high heels.  Have rubber bottoms.  Are comfortable and fit you well.  Are closed at the toe. Do not wear sandals.  If you use a stepladder:  Make sure that it is fully opened. Do not climb a closed stepladder.  Make sure that both sides of the stepladder are locked into place.  Ask someone to hold it for you, if possible.  Clearly mark and make sure that you can see:  Any grab bars or handrails.  First and last steps.  Where the edge of each step is.  Use tools that help you move around (mobility aids) if they are needed. These include:  Canes.  Walkers.  Scooters.  Crutches.  Turn on the lights when you go into a dark area. Replace any light bulbs as soon as they burn out.  Set up your furniture so you have a clear path. Avoid moving your furniture around.  If any of your floors are uneven, fix them.  If there are any pets around you, be aware of where they are.  Review your medicines with your doctor. Some medicines can make you feel dizzy. This can increase your chance of falling. Ask your doctor what other things that you can do to help prevent falls. This information is not intended to replace advice given to you by your health care provider. Make sure you discuss any questions you have with your health care provider. Document Released: 01/29/2009 Document Revised: 09/10/2015 Document Reviewed: 05/09/2014 Elsevier Interactive Patient Education  2017 [employer].

## 2020-08-03 NOTE — Progress Notes (Signed)
Subjective:   Larry Gates is a 87 y.o. male who presents for Medicare Annual/Subsequent preventive examination.  Virtual Visit via Telephone Note  I connected with  Larry Gates on 08/03/20 at  2:00 PM EDT by telephone and verified that I am speaking with the correct person using two identifiers.  Location: Patient: Home Provider: [facility] Persons participating in the virtual visit: patient, his son/Nurse Health Advisor   I discussed the limitations, risks, security and privacy concerns of performing an evaluation and management service by telephone and the availability of in person appointments. The patient expressed understanding and agreed to proceed.  Interactive audio and video telecommunications were attempted between this nurse and patient, however failed, due to patient having technical difficulties OR patient did not have access to video capability.  We continued and completed visit with audio only.  Some vital signs may be absent or patient reported.   Larry Gates E Rachael Zapanta, LPN   Review of Systems     Cardiac Risk Factors include: advanced age (>62men, >71 women);family history of premature cardiovascular disease;hypertension;male gender     Objective:    Today's Vitals   08/03/20 1348  Weight: 126 lb (57.2 kg)  Height: 5\' 6"  (1.676 m)   Body mass index is 20.34 kg/m.  Advanced Directives 08/03/2020 08/02/2019 05/24/2018 05/22/2018 11/14/2017 08/29/2017 10/12/2015  Does Patient Have a Medical Advance Directive? Yes Yes Yes No No No Yes  Type of Paramedic of [place];Living will Living will;Healthcare Power of [place];Living will  Does patient want to make changes to medical advance directive? - No - Patient declined No - Patient declined - - - No - Patient declined  Copy of [place] in Chart? No - copy requested No - copy requested - - - - No - copy requested  Would  patient like information on creating a medical advance directive? - - No - Patient declined No - Patient declined No - Patient declined - -  Pre-existing out of facility DNR order (yellow form or pink MOST form) - - - - - - -    Current Medications (verified) Outpatient Encounter Medications as of 08/03/2020  Medication Sig  . amiodarone (PACERONE) 200 MG tablet TAKE 1 TABLET BY MOUTH ONCE DAILY . APPOINTMENT REQUIRED FOR FUTURE REFILLS  . DILT-XR 240 MG 24 hr capsule TAKE 1 CAPSULE BY MOUTH ONCE DAILY . APPOINTMENT REQUIRED FOR FUTURE REFILLS  . levothyroxine (EUTHYROX) 88 MCG tablet Take 1 tablet (88 mcg total) by mouth daily before breakfast.  . tamsulosin (FLOMAX) 0.4 MG CAPS capsule Take 0.4 mg by mouth daily.  [name] warfarin (COUMADIN) 2 MG tablet TAKE 1 TABLET BY MOUTH ONCE DAILY AS DIRECTED BY  COUMADIN  CLINIC   No facility-administered encounter medications on file as of 08/03/2020.    Allergies (verified) Penicillins   History: Past Medical History:  Diagnosis Date  . Anticoagulated on Coumadin    a. Followed @ [facility]  . Atypical atrial flutter ([place])    a. Dx 07/2010 and coumadin initiated;  b. 08/2010 s/p DCCV.  . Cataract   . Hard of hearing   . Midsternal chest pain    a. In setting of A Flutter 07/2010;  b. Cath 07/2010: nonobs dzs  . Prostate cancer ([place])   . TIA (transient ischemic attack)   . Vertigo    Past Surgical History:  Procedure Laterality Date  . APPENDECTOMY    .  CARDIOVERSION  10/14/2011   Procedure: CARDIOVERSION;  Surgeon: Lelon Perla, MD;  Location: [place];  Service: Cardiovascular;  Laterality: N/A;  . CARDIOVERSION N/A 07/24/2012   Procedure: CARDIOVERSION;  Surgeon: [name], MD;  Location: [place];  Service: Cardiovascular;  Laterality: N/A;  . EYE SURGERY Bilateral    cataract  . I & D EXTREMITY Left 05/04/2013   Procedure: IRRIGATION AND DEBRIDEMENT of left hand with repair of lacerations and revision amputation of index finger;   Surgeon: Roseanne Kaufman, MD;  Location: [facility];  Service: Orthopedics;  Laterality: Left;  . TONSILLECTOMY     Family History  Problem Relation Age of Onset  . CVA Father   . Alzheimer's disease Father   . Leukemia Father   . Alzheimer's disease Mother   . Heart Problems Brother        stents in his 98's-80's  . Early death Sister   . Early death Brother    Social History   Socioeconomic History  . Marital status: Widowed    Spouse name: Not on file  . Number of children: 1  . Years of education: 20  . Highest education level: 12th grade  Occupational History  . Occupation: Retired  Tobacco Use  . Smoking status: Never Smoker  . Smokeless tobacco: Never Used  Vaping Use  . Vaping Use: Never used  Substance and Sexual Activity  . Alcohol use: No  . Drug use: No  . Sexual activity: Never  Other Topics Concern  . Not on file  Social History Narrative   Lives in [place] by himself.  Children live nearby and check in on him daily. Son lives next door   Social Determinants of Health   Financial Resource Strain: Low Risk   . Difficulty of Paying Living Expenses: Not hard at all  Food Insecurity: No Food Insecurity  . Worried About Charity fundraiser in the Last Year: Never true  . Ran Out of Food in the Last Year: Never true  Transportation Needs: No Transportation Needs  . Lack of Transportation (Medical): No  . Lack of Transportation (Non-Medical): No  Physical Activity: Sufficiently Active  . Days of Exercise per Week: 5 days  . Minutes of Exercise per Session: 40 min  Stress: No Stress Concern Present  . Feeling of Stress : Not at all  Social Connections: Moderately Integrated  . Frequency of Communication with Friends and Family: More than three times a week  . Frequency of Social Gatherings with Friends and Family: More than three times a week  . Attends Religious Services: More than 4 times per year  . Active Member of Clubs or Organizations: Yes  . Attends  Archivist Meetings: More than 4 times per year  . Marital Status: Widowed    Tobacco Counseling Counseling given: No   Clinical Intake:  Pre-visit preparation completed: Yes  Pain : No/denies pain     BMI - recorded: 20.34 Nutritional Status: BMI of 19-24  Normal Nutritional Risks: None Diabetes: No  How often do you need to have someone help you when you read instructions, pamphlets, or other written materials from your doctor or pharmacy?: 1 - Never  Diabetic? No  Interpreter Needed?: No  Information entered by :: Ledia Hanford, LPN   Activities of Daily Living In your present state of health, do you have any difficulty performing the following activities: 08/03/2020  Hearing? Y  Vision? N  Difficulty concentrating or making decisions? N  Walking or climbing stairs? Y  Dressing or bathing? N  Doing errands, shopping? N  Preparing Food and eating ? N  Using the Toilet? N  In the past six months, have you accidently leaked urine? N  Do you have problems with loss of bowel control? N  Managing your Medications? N  Managing your Finances? N  Housekeeping or managing your Housekeeping? N  Some recent data might be hidden    Patient Care Team: Claretta Fraise, MD as PCP - General (Family Medicine) Harlen Labs, MD as Referring Physician (Optometry) Minus Breeding, MD as Consulting Physician (Cardiology)  Indicate any recent Medical Services you may have received from other than [facility] providers in the past year (date may be approximate).     Assessment:   This is a routine wellness examination for Rafael Capi.  Hearing/Vision screen  Hearing Screening   125Hz  250Hz  500Hz  1000Hz  2000Hz  3000Hz  4000Hz  6000Hz  8000Hz   Right ear:           Left ear:           Comments: Very hard of hearing - Wears hearing aids  Vision Screening Comments: Annual visits with Dr Marin Comment in [place] - up to date with eye exam  Dietary issues and exercise activities  discussed: Current Exercise Habits: Home exercise routine, Type of exercise: walking, Time (Minutes): 40, Frequency (Times/Week): 5, Weekly Exercise (Minutes/Week): 200, Intensity: Mild, Exercise limited by: cardiac condition(s)  Goals    . Exercise 3x per week (30 min per time)     08/02/2019 AWV Goal: Exercise for General Health   Patient will verbalize understanding of the benefits of increased physical activity:  Exercising regularly is important. It will improve your overall fitness, flexibility, and endurance.  Regular exercise also will improve your overall health. It can help you control your weight, reduce stress, and improve your bone density.  Over the next year, patient will increase physical activity as tolerated with a goal of at least 150 minutes of moderate physical activity per week.   You can tell that you are exercising at a moderate intensity if your heart starts beating faster and you start breathing faster but can still hold a conversation.  Moderate-intensity exercise ideas include:  Walking 1 mile (1.6 km) in about 15 minutes  Biking  Hiking  Golfing  Dancing  Water aerobics  Patient will verbalize understanding of everyday activities that increase physical activity by providing examples like the following: ? Yard work, such as: ? Pushing a Conservation officer, nature ? Raking and bagging leaves ? Washing your car ? Pushing a stroller ? Shoveling snow ? Gardening ? Washing windows or floors  Patient will be able to explain general safety guidelines for exercising:   Before you start a new exercise program, talk with your health care provider.  Do not exercise so much that you hurt yourself, feel dizzy, or get very short of breath.  Wear comfortable clothes and wear shoes with good support.  Drink plenty of water while you exercise to prevent dehydration or heat stroke.  Work out until your breathing and your heartbeat get faster.  Goals Addressed              This Visit's Progress   . Exercise 3x per week (30 min per time)   On track    08/02/2019 AWV Goal: Exercise for General Health   Patient will verbalize understanding of the benefits of increased physical activity:  Exercising regularly is important. It will improve  your overall fitness, flexibility, and endurance.  Regular exercise also will improve your overall health. It can help you control your weight, reduce stress, and improve your bone density.  Over the next year, patient will increase physical activity as tolerated with a goal of at least 150 minutes of moderate physical activity per week.   You can tell that you are exercising at a moderate intensity if your heart starts beating faster and you start breathing faster but can still hold a conversation.  Moderate-intensity exercise ideas include:  Walking 1 mile (1.6 km) in about 15 minutes  Biking  Hiking  Golfing  Dancing  Water aerobics  Patient will verbalize understanding of everyday activities that increase physical activity by providing examples like the following: ? Yard work, such as: ? Pushing a Conservation officer, nature ? Raking and bagging leaves ? Washing your car ? Pushing a stroller ? Shoveling snow ? Gardening ? Washing windows or floors  Patient will be able to explain general safety guidelines for exercising:   Before you start a new exercise program, talk with your health care provider.  Do not exercise so much that you hurt yourself, feel dizzy, or get very short of breath.  Wear comfortable clothes and wear shoes with good support.  Drink plenty of water while you exercise to prevent dehydration or heat stroke.  Work out until your breathing and your heartbeat get faster.              Depression Screen PHQ 2/9 Scores 08/03/2020 06/02/2020 11/29/2019 08/02/2019 06/13/2019 10/29/2018 10/24/2018  PHQ - 2 Score 0 0 0 0 0 0 0    Fall Risk Fall Risk  08/03/2020 06/02/2020 04/29/2020 11/29/2019  08/02/2019  Falls in the past year? 1 1 1 1  0  Number falls in past yr: 1 1 1 1  0  Injury with Fall? 1 1 1  0 0  Comment fractured pelvis - no surgery required - - - -  Risk for fall due to : History of fall(s);Impaired balance/gait;Orthopedic patient History of fall(s);Impaired mobility History of fall(s) Impaired balance/gait;Impaired mobility;History of fall(s) No Fall Risks  Follow up Falls prevention discussed;Education provided Falls evaluation completed Falls evaluation completed Falls evaluation completed Falls evaluation completed    [place]:  Any stairs in or around the home? Yes  If so, are there any without handrails? Yes  Home free of loose throw rugs in walkways, pet beds, electrical cords, etc? Yes  Adequate lighting in your home to reduce risk of falls? Yes   ASSISTIVE DEVICES UTILIZED TO PREVENT FALLS:  Life alert? No  Use of a cane, walker or w/c? Yes  Grab bars in the bathroom? Yes  Shower chair or bench in shower? Yes  Elevated toilet seat or a handicapped toilet? Yes   TIMED UP AND GO:  Was the test performed? No . Telephonic visit.  Cognitive Function: Normal cognitive status assessed by direct observation by this Nurse Health Advisor. No abnormalities found. His son says his memory is great.  MMSE - Mini Mental State Exam 10/12/2015 08/29/2014  Orientation to time 5 5  Orientation to Place 5 5  Registration 3 3  Attention/ Calculation 4 3  Recall 3 3  Language- name 2 objects 2 2  Language- repeat 1 1  Language- follow 3 step command 3 3  Language- read & follow direction 1 1  Write a sentence 1 1  Copy design 1 1  Total score 29 28  6CIT Screen 08/02/2019  What Year? 0 points  What month? 0 points  What time? 0 points  Count back from 20 0 points  Months in reverse 0 points  Repeat phrase 0 points  Total Score 0    Immunizations Immunization History  Administered Date(s) Administered  . Fluad Quad(high  Dose 65+) 02/22/2019, 02/04/2020  . Influenza, High Dose Seasonal PF 02/05/2015, 02/19/2016, 02/24/2017, 02/14/2018  . Influenza,inj,Quad PF,6+ Mos 01/08/2013, 01/17/2014  . Influenza-Unspecified 02/05/2015, 02/19/2016, 02/24/2017, 02/14/2018  . Moderna Sars-Covid-2 Vaccination 05/20/2019, 06/17/2019  . Pneumococcal Conjugate-13 08/29/2014  . Pneumococcal Polysaccharide-23 04/06/2005  . Pneumococcal-Unspecified 04/06/2005  . Tdap 05/04/2013    TDAP status: Up to date  Flu Vaccine status: Up to date  Pneumococcal vaccine status: Up to date  Covid-19 vaccine status: Information provided on how to obtain vaccines.   Qualifies for Shingles Vaccine? Yes   Zostavax completed No   Shingrix Completed?: No.    Education has been provided regarding the importance of this vaccine. Patient has been advised to call insurance company to determine out of pocket expense if they have not yet received this vaccine. Advised may also receive vaccine at local pharmacy or Health Dept. Verbalized acceptance and understanding.  Screening Tests Health Maintenance  Topic Date Due  . COVID-19 Vaccine (3 - Moderna risk 4-dose series) 07/15/2019  . INFLUENZA VACCINE  11/16/2020  . TETANUS/TDAP  05/05/2023  . PNA vac Low Risk Adult  Completed  . HPV VACCINES  Aged Out    Health Maintenance  Health Maintenance Due  Topic Date Due  . COVID-19 Vaccine (3 - Moderna risk 4-dose series) 07/15/2019    Colorectal cancer screening: No longer required.   Lung Cancer Screening: (Low Dose CT Chest recommended if Age 70-80 years, 30 pack-year currently smoking OR have quit w/in 15years.) does not qualify.   Additional Screening:  Hepatitis C Screening: does not qualify  Vision Screening: Recommended annual ophthalmology exams for early detection of glaucoma and other disorders of the eye. Is the patient up to date with their annual eye exam?  Yes  Who is the provider or what is the name of the office in which  the patient attends annual eye exams? Dr Marin Comment in [place] If pt is not established with a provider, would they like to be referred to a provider to establish care? No .   Dental Screening: Recommended annual dental exams for proper oral hygiene  Community Resource Referral / Chronic Care Management: CRR required this visit?  No   CCM required this visit?  No      Plan:     I have personally reviewed and noted the following in the patient's chart:   . Medical and social history . Use of alcohol, tobacco or illicit drugs  . Current medications and supplements . Functional ability and status . Nutritional status . Physical activity . Advanced directives . List of other physicians . Hospitalizations, surgeries, and ER visits in previous 12 months . Vitals . Screenings to include cognitive, depression, and falls . Referrals and appointments  In addition, I have reviewed and discussed with patient certain preventive protocols, quality metrics, and best practice recommendations. A written personalized care plan for preventive services as well as general preventive health recommendations were provided to patient.     Sandrea Hammond, LPN   4/43/1540   Nurse Notes: none

## 2020-08-05 ENCOUNTER — Ambulatory Visit (INDEPENDENT_AMBULATORY_CARE_PROVIDER_SITE_OTHER): Payer: Medicare Other | Admitting: Cardiovascular Disease

## 2020-08-05 DIAGNOSIS — Z79899 Other long term (current) drug therapy: Secondary | ICD-10-CM

## 2020-08-05 DIAGNOSIS — I482 Chronic atrial fibrillation, unspecified: Secondary | ICD-10-CM | POA: Diagnosis not present

## 2020-08-05 LAB — POCT INR: INR: 2.9 (ref 2.0–3.0)

## 2020-08-13 DIAGNOSIS — Z7901 Long term (current) use of anticoagulants: Secondary | ICD-10-CM | POA: Diagnosis not present

## 2020-08-13 DIAGNOSIS — I480 Paroxysmal atrial fibrillation: Secondary | ICD-10-CM | POA: Diagnosis not present

## 2020-08-13 LAB — POCT INR: INR: 2.9 (ref 2.0–3.0)

## 2020-08-16 DIAGNOSIS — U071 COVID-19: Secondary | ICD-10-CM

## 2020-08-16 HISTORY — DX: COVID-19: U07.1

## 2020-08-17 ENCOUNTER — Ambulatory Visit (INDEPENDENT_AMBULATORY_CARE_PROVIDER_SITE_OTHER): Payer: Medicare Other | Admitting: Cardiology

## 2020-08-17 DIAGNOSIS — Z79899 Other long term (current) drug therapy: Secondary | ICD-10-CM

## 2020-08-17 DIAGNOSIS — I482 Chronic atrial fibrillation, unspecified: Secondary | ICD-10-CM

## 2020-08-19 ENCOUNTER — Other Ambulatory Visit: Payer: Self-pay | Admitting: Cardiology

## 2020-08-20 LAB — POCT INR: INR: 2.5 (ref 2.0–3.0)

## 2020-08-21 ENCOUNTER — Ambulatory Visit (INDEPENDENT_AMBULATORY_CARE_PROVIDER_SITE_OTHER): Payer: Medicare Other | Admitting: Cardiology

## 2020-08-21 DIAGNOSIS — I482 Chronic atrial fibrillation, unspecified: Secondary | ICD-10-CM

## 2020-08-21 DIAGNOSIS — Z79899 Other long term (current) drug therapy: Secondary | ICD-10-CM | POA: Diagnosis not present

## 2020-08-26 ENCOUNTER — Ambulatory Visit (INDEPENDENT_AMBULATORY_CARE_PROVIDER_SITE_OTHER): Payer: Medicare Other | Admitting: Cardiovascular Disease

## 2020-08-26 DIAGNOSIS — Z79899 Other long term (current) drug therapy: Secondary | ICD-10-CM

## 2020-08-26 DIAGNOSIS — I482 Chronic atrial fibrillation, unspecified: Secondary | ICD-10-CM

## 2020-08-26 LAB — POCT INR: INR: 2.4 (ref 2.0–3.0)

## 2020-08-31 DIAGNOSIS — R918 Other nonspecific abnormal finding of lung field: Secondary | ICD-10-CM | POA: Diagnosis not present

## 2020-08-31 DIAGNOSIS — I350 Nonrheumatic aortic (valve) stenosis: Secondary | ICD-10-CM | POA: Diagnosis not present

## 2020-08-31 DIAGNOSIS — J1282 Pneumonia due to coronavirus disease 2019: Secondary | ICD-10-CM | POA: Diagnosis not present

## 2020-08-31 DIAGNOSIS — R0602 Shortness of breath: Secondary | ICD-10-CM | POA: Diagnosis not present

## 2020-08-31 DIAGNOSIS — Z9181 History of falling: Secondary | ICD-10-CM | POA: Diagnosis not present

## 2020-08-31 DIAGNOSIS — J90 Pleural effusion, not elsewhere classified: Secondary | ICD-10-CM | POA: Diagnosis not present

## 2020-08-31 DIAGNOSIS — I4891 Unspecified atrial fibrillation: Secondary | ICD-10-CM | POA: Diagnosis not present

## 2020-08-31 DIAGNOSIS — I129 Hypertensive chronic kidney disease with stage 1 through stage 4 chronic kidney disease, or unspecified chronic kidney disease: Secondary | ICD-10-CM | POA: Diagnosis not present

## 2020-08-31 DIAGNOSIS — R2689 Other abnormalities of gait and mobility: Secondary | ICD-10-CM | POA: Diagnosis not present

## 2020-08-31 DIAGNOSIS — Z880 Allergy status to penicillin: Secondary | ICD-10-CM | POA: Diagnosis not present

## 2020-08-31 DIAGNOSIS — Z8673 Personal history of transient ischemic attack (TIA), and cerebral infarction without residual deficits: Secondary | ICD-10-CM | POA: Diagnosis not present

## 2020-08-31 DIAGNOSIS — E039 Hypothyroidism, unspecified: Secondary | ICD-10-CM | POA: Diagnosis not present

## 2020-08-31 DIAGNOSIS — U071 COVID-19: Secondary | ICD-10-CM | POA: Diagnosis not present

## 2020-08-31 DIAGNOSIS — Z79899 Other long term (current) drug therapy: Secondary | ICD-10-CM | POA: Diagnosis not present

## 2020-08-31 DIAGNOSIS — Z79890 Hormone replacement therapy (postmenopausal): Secondary | ICD-10-CM | POA: Diagnosis not present

## 2020-08-31 DIAGNOSIS — J986 Disorders of diaphragm: Secondary | ICD-10-CM | POA: Diagnosis not present

## 2020-08-31 DIAGNOSIS — Z7901 Long term (current) use of anticoagulants: Secondary | ICD-10-CM | POA: Diagnosis not present

## 2020-08-31 DIAGNOSIS — N184 Chronic kidney disease, stage 4 (severe): Secondary | ICD-10-CM | POA: Diagnosis not present

## 2020-09-01 DIAGNOSIS — E039 Hypothyroidism, unspecified: Secondary | ICD-10-CM | POA: Diagnosis not present

## 2020-09-01 DIAGNOSIS — I350 Nonrheumatic aortic (valve) stenosis: Secondary | ICD-10-CM | POA: Diagnosis not present

## 2020-09-01 DIAGNOSIS — J1282 Pneumonia due to coronavirus disease 2019: Secondary | ICD-10-CM | POA: Diagnosis not present

## 2020-09-01 DIAGNOSIS — I4891 Unspecified atrial fibrillation: Secondary | ICD-10-CM | POA: Diagnosis not present

## 2020-09-01 DIAGNOSIS — U071 COVID-19: Secondary | ICD-10-CM | POA: Diagnosis not present

## 2020-09-02 DIAGNOSIS — I350 Nonrheumatic aortic (valve) stenosis: Secondary | ICD-10-CM | POA: Diagnosis not present

## 2020-09-02 DIAGNOSIS — U071 COVID-19: Secondary | ICD-10-CM | POA: Diagnosis not present

## 2020-09-02 DIAGNOSIS — I4891 Unspecified atrial fibrillation: Secondary | ICD-10-CM | POA: Diagnosis not present

## 2020-09-02 DIAGNOSIS — E039 Hypothyroidism, unspecified: Secondary | ICD-10-CM | POA: Diagnosis not present

## 2020-09-03 ENCOUNTER — Ambulatory Visit (INDEPENDENT_AMBULATORY_CARE_PROVIDER_SITE_OTHER): Payer: Medicare Other | Admitting: Cardiology

## 2020-09-03 ENCOUNTER — Other Ambulatory Visit (HOSPITAL_COMMUNITY): Payer: Medicare Other

## 2020-09-03 DIAGNOSIS — I4892 Unspecified atrial flutter: Secondary | ICD-10-CM

## 2020-09-03 DIAGNOSIS — Z7901 Long term (current) use of anticoagulants: Secondary | ICD-10-CM | POA: Diagnosis not present

## 2020-09-03 DIAGNOSIS — I482 Chronic atrial fibrillation, unspecified: Secondary | ICD-10-CM | POA: Diagnosis not present

## 2020-09-03 DIAGNOSIS — Z79899 Other long term (current) drug therapy: Secondary | ICD-10-CM

## 2020-09-03 LAB — POCT INR: INR: 2.7 (ref 2.0–3.0)

## 2020-09-04 ENCOUNTER — Telehealth: Payer: Self-pay

## 2020-09-04 NOTE — Telephone Encounter (Signed)
Transition Care Management Follow-up Telephone Call  Date of discharge and from where: [facility]  Diagnosis: Covid pneumonia  How have you been since you were released from the hospital? Much better today  Any questions or concerns? No  Items Reviewed:  Did the pt receive and understand the discharge instructions provided? Yes   Medications obtained and verified? Yes   Other? No   Any new allergies since your discharge? No   Dietary orders reviewed? Yes  Do you have support at home? Yes   Home Care and Equipment/Supplies: Were home health services ordered? yes If so, what is the name of the agency? [facility]  Has the agency set up a time to come to the patient's home? yes Were any new equipment or medical supplies ordered?  No What is the name of the medical supply agency? n/s Were you able to get the supplies/equipment? not applicable Do you have any questions related to the use of the equipment or supplies? No  Functional Questionnaire: (I = Independent and D = Dependent) ADLs: I  Bathing/Dressing- I  Meal Prep- I  Eating- I  Maintaining continence- I  Transferring/Ambulation- I - with walker  Managing Meds- I  Follow up appointments reviewed:   PCP Hospital f/u appt confirmed? Yes  Scheduled to see Marjorie Smolder on 5/26 @ 1:00.  [facility] f/u appt confirmed? No    Are transportation arrangements needed? No   If their condition worsens, is the pt aware to call PCP or go to the Emergency Dept.? Yes  Was the patient provided with contact information for the PCP's office or ED? Yes  Was to pt encouraged to call back with questions or concerns? Yes

## 2020-09-06 DIAGNOSIS — Z8546 Personal history of malignant neoplasm of prostate: Secondary | ICD-10-CM | POA: Diagnosis not present

## 2020-09-06 DIAGNOSIS — I350 Nonrheumatic aortic (valve) stenosis: Secondary | ICD-10-CM | POA: Diagnosis not present

## 2020-09-06 DIAGNOSIS — Z8673 Personal history of transient ischemic attack (TIA), and cerebral infarction without residual deficits: Secondary | ICD-10-CM | POA: Diagnosis not present

## 2020-09-06 DIAGNOSIS — Z9181 History of falling: Secondary | ICD-10-CM | POA: Diagnosis not present

## 2020-09-06 DIAGNOSIS — N184 Chronic kidney disease, stage 4 (severe): Secondary | ICD-10-CM | POA: Diagnosis not present

## 2020-09-06 DIAGNOSIS — I4891 Unspecified atrial fibrillation: Secondary | ICD-10-CM | POA: Diagnosis not present

## 2020-09-06 DIAGNOSIS — I4892 Unspecified atrial flutter: Secondary | ICD-10-CM | POA: Diagnosis not present

## 2020-09-06 DIAGNOSIS — N400 Enlarged prostate without lower urinary tract symptoms: Secondary | ICD-10-CM | POA: Diagnosis not present

## 2020-09-06 DIAGNOSIS — Z7901 Long term (current) use of anticoagulants: Secondary | ICD-10-CM | POA: Diagnosis not present

## 2020-09-06 DIAGNOSIS — H9190 Unspecified hearing loss, unspecified ear: Secondary | ICD-10-CM | POA: Diagnosis not present

## 2020-09-06 DIAGNOSIS — I129 Hypertensive chronic kidney disease with stage 1 through stage 4 chronic kidney disease, or unspecified chronic kidney disease: Secondary | ICD-10-CM | POA: Diagnosis not present

## 2020-09-06 DIAGNOSIS — J1282 Pneumonia due to coronavirus disease 2019: Secondary | ICD-10-CM | POA: Diagnosis not present

## 2020-09-06 DIAGNOSIS — E039 Hypothyroidism, unspecified: Secondary | ICD-10-CM | POA: Diagnosis not present

## 2020-09-06 DIAGNOSIS — U071 COVID-19: Secondary | ICD-10-CM | POA: Diagnosis not present

## 2020-09-07 ENCOUNTER — Telehealth: Payer: Self-pay | Admitting: Family Medicine

## 2020-09-07 NOTE — Telephone Encounter (Signed)
Does this require Face to Face?

## 2020-09-07 NOTE — Telephone Encounter (Signed)
Pam called from [facility] stating that they need Dr Livia Snellen to send order for pt to get some skilled home care. Says pt tested positive for covid between 5/12 and 5/14 and was just recently discharged from the hospital and needs help at home.  Please fax order to [phone]

## 2020-09-08 NOTE — Telephone Encounter (Signed)
VO given for nursing to [facility]

## 2020-09-09 DIAGNOSIS — I480 Paroxysmal atrial fibrillation: Secondary | ICD-10-CM | POA: Diagnosis not present

## 2020-09-09 DIAGNOSIS — Z7901 Long term (current) use of anticoagulants: Secondary | ICD-10-CM | POA: Diagnosis not present

## 2020-09-09 LAB — POCT INR: INR: 5.1 — AB (ref 2.0–3.0)

## 2020-09-10 ENCOUNTER — Ambulatory Visit: Payer: Medicare Other | Admitting: Family Medicine

## 2020-09-10 ENCOUNTER — Telehealth: Payer: Self-pay | Admitting: Family Medicine

## 2020-09-10 ENCOUNTER — Other Ambulatory Visit: Payer: Medicare Other

## 2020-09-10 ENCOUNTER — Ambulatory Visit (INDEPENDENT_AMBULATORY_CARE_PROVIDER_SITE_OTHER): Payer: Medicare Other | Admitting: Cardiology

## 2020-09-10 ENCOUNTER — Other Ambulatory Visit: Payer: Self-pay | Admitting: Family Medicine

## 2020-09-10 ENCOUNTER — Telehealth: Payer: Self-pay | Admitting: Cardiology

## 2020-09-10 DIAGNOSIS — I482 Chronic atrial fibrillation, unspecified: Secondary | ICD-10-CM | POA: Diagnosis not present

## 2020-09-10 DIAGNOSIS — Z79899 Other long term (current) drug therapy: Secondary | ICD-10-CM

## 2020-09-10 DIAGNOSIS — I4892 Unspecified atrial flutter: Secondary | ICD-10-CM

## 2020-09-10 NOTE — Telephone Encounter (Signed)
Requested tests have ben ordered

## 2020-09-10 NOTE — Telephone Encounter (Signed)
Pt is aware.  

## 2020-09-10 NOTE — Telephone Encounter (Signed)
Received a call from [facility] calling to report patient's home INR on 5/25- 5.1.Message sent to coumadin clinic.

## 2020-09-10 NOTE — Telephone Encounter (Signed)
This is being addressed in an anticoagulation encounter

## 2020-09-10 NOTE — Telephone Encounter (Signed)
    [facility] calling to report critical INR report, about to transfger to triage but caller hangup

## 2020-09-11 ENCOUNTER — Encounter: Payer: Self-pay | Admitting: Family Medicine

## 2020-09-11 ENCOUNTER — Ambulatory Visit: Payer: Medicare Other | Admitting: Nurse Practitioner

## 2020-09-11 ENCOUNTER — Ambulatory Visit (INDEPENDENT_AMBULATORY_CARE_PROVIDER_SITE_OTHER): Payer: Medicare Other | Admitting: Family Medicine

## 2020-09-11 VITALS — BP 147/82 | HR 114 | Temp 98.1°F

## 2020-09-11 DIAGNOSIS — R791 Abnormal coagulation profile: Secondary | ICD-10-CM

## 2020-09-11 DIAGNOSIS — J1282 Pneumonia due to coronavirus disease 2019: Secondary | ICD-10-CM

## 2020-09-11 DIAGNOSIS — U071 COVID-19: Secondary | ICD-10-CM

## 2020-09-11 LAB — CBC WITH DIFFERENTIAL/PLATELET
Basophils Absolute: 0.1 10*3/uL (ref 0.0–0.2)
Basos: 0 %
EOS (ABSOLUTE): 0 10*3/uL (ref 0.0–0.4)
Eos: 0 %
Hematocrit: 37.6 % (ref 37.5–51.0)
Hemoglobin: 13.6 g/dL (ref 13.0–17.7)
Immature Grans (Abs): 0.2 10*3/uL — ABNORMAL HIGH (ref 0.0–0.1)
Immature Granulocytes: 1 %
Lymphocytes Absolute: 0.1 10*3/uL — ABNORMAL LOW (ref 0.7–3.1)
Lymphs: 1 %
MCH: 31.6 pg (ref 26.6–33.0)
MCHC: 36.2 g/dL — ABNORMAL HIGH (ref 31.5–35.7)
MCV: 87 fL (ref 79–97)
Monocytes Absolute: 0.3 10*3/uL (ref 0.1–0.9)
Monocytes: 2 %
Neutrophils Absolute: 14 10*3/uL — ABNORMAL HIGH (ref 1.4–7.0)
Neutrophils: 96 %
Platelets: 178 10*3/uL (ref 150–450)
RBC: 4.3 x10E6/uL (ref 4.14–5.80)
RDW: 14.4 % (ref 11.6–15.4)
WBC: 14.6 10*3/uL — ABNORMAL HIGH (ref 3.4–10.8)

## 2020-09-11 LAB — CMP14+EGFR
ALT: 27 IU/L (ref 0–44)
AST: 21 IU/L (ref 0–40)
Albumin/Globulin Ratio: 2 (ref 1.2–2.2)
Albumin: 4 g/dL (ref 3.6–4.6)
Alkaline Phosphatase: 125 IU/L — ABNORMAL HIGH (ref 44–121)
BUN/Creatinine Ratio: 37 — ABNORMAL HIGH (ref 10–24)
BUN: 49 mg/dL — ABNORMAL HIGH (ref 8–27)
Bilirubin Total: 1 mg/dL (ref 0.0–1.2)
CO2: 17 mmol/L — ABNORMAL LOW (ref 20–29)
Calcium: 9 mg/dL (ref 8.6–10.2)
Chloride: 103 mmol/L (ref 96–106)
Creatinine, Ser: 1.31 mg/dL — ABNORMAL HIGH (ref 0.76–1.27)
Globulin, Total: 2 g/dL (ref 1.5–4.5)
Glucose: 230 mg/dL — ABNORMAL HIGH (ref 65–99)
Potassium: 5.2 mmol/L (ref 3.5–5.2)
Sodium: 138 mmol/L (ref 134–144)
Total Protein: 6 g/dL (ref 6.0–8.5)
eGFR: 52 mL/min/{1.73_m2} — ABNORMAL LOW (ref >59)

## 2020-09-11 LAB — PROTIME-INR
INR: 4.9 — ABNORMAL HIGH (ref 0.9–1.2)
Prothrombin Time: 48.5 s — ABNORMAL HIGH (ref 9.1–12.0)

## 2020-09-11 MED ORDER — FUROSEMIDE 20 MG PO TABS: 20.0000 mg | ORAL_TABLET | Freq: Every day | ORAL | 0 refills | Status: AC | PRN

## 2020-09-11 MED ORDER — TAMSULOSIN HCL 0.4 MG PO CAPS: 0.4000 mg | ORAL_CAPSULE | Freq: Every day | ORAL | 0 refills | Status: AC

## 2020-09-11 NOTE — Progress Notes (Signed)
Assessment & Plan:  1. Pneumonia due to COVID-19 virus Continue albuterol 3-4 times per day as needed while recovering from COVID-pneumonia.  Continue working with home health physical therapy.  2. Supratherapeutic INR Managed by cardiology.  Reinforced their plan to hold Coumadin yesterday and today.   Hendricks Limes, MSN, APRN, FNP-C [facility]  Subjective:   [name], male    DOB: 1928/12/24, 87 y.o.   MRN: [id number]  HPI: Larry Gates is a 87 y.o. male presenting on 09/11/2020 for Transitions Of Care ([facility] - covid pneumonia. Patient still has SOB )  Patient is accompanied by his daughter-in-law, who he is okay with being present.  Patient was admitted to [facility] 08/31/2020 - 09/03/2020 due to COVID-pneumonia.  He did not require supplemental oxygen.  He was discharged with Decadron, albuterol, Tessalon Perles, guaifenesin, and doxycycline.  Home health was also ordered at discharge.  Today he reports he has taken all of his medicines as prescribed.  He is using the albuterol via nebulizer 3-4 times per day due to shortness of breath.  Home health is coming out.  They are concerned about his kidneys and his supratherapeutic INR.  He came in yesterday for lab work.  He has chronic kidney disease that is not worsening.  His INR is supratherapeutic, but is managed by cardiology.  He is currently holding his Coumadin yesterday and today with directions to recheck next week.   ROS: Negative unless specifically indicated above in HPI.   Relevant past medical history reviewed and updated as indicated.   Allergies and medications reviewed and updated.   Current Outpatient Medications:  .  albuterol (VENTOLIN HFA) 108 (90 Base) MCG/ACT inhaler, Inhale into the lungs., Disp: , Rfl:  .  amiodarone (PACERONE) 200 MG tablet, TAKE 1 TABLET BY MOUTH ONCE DAILY . APPOINTMENT REQUIRED FOR FUTURE REFILLS, Disp: 90 tablet,  Rfl: 3 .  benzonatate (TESSALON) 100 MG capsule, Take 100 mg by mouth 3 (three) times daily., Disp: , Rfl:  .  dexamethasone (DECADRON) 6 MG tablet, Take 6 mg by mouth daily., Disp: , Rfl:  .  DILT-XR 240 MG 24 hr capsule, TAKE 1 CAPSULE BY MOUTH ONCE DAILY . APPOINTMENT REQUIRED FOR FUTURE REFILLS, Disp: 90 capsule, Rfl: 3 .  doxycycline (VIBRAMYCIN) 100 MG capsule, Take 100 mg by mouth 2 (two) times daily., Disp: , Rfl:  .  levothyroxine (EUTHYROX) 88 MCG tablet, Take 1 tablet (88 mcg total) by mouth daily before breakfast., Disp: 30 tablet, Rfl: 11 .  tamsulosin (FLOMAX) 0.4 MG CAPS capsule, Take 0.4 mg by mouth daily., Disp: , Rfl:  .  warfarin (COUMADIN) 2 MG tablet, TAKE 1 TABLET BY MOUTH ONCE DAILY AS DIRECTED BY  COUMADIN  CLINIC, Disp: 90 tablet, Rfl: 0  Allergies  Allergen Reactions  . Penicillins Rash    Objective:   BP (!) 147/82   Pulse (!) 114   Temp 98.1 F (36.7 C) (Temporal)   SpO2 96%    Physical Exam Vitals reviewed.  Constitutional:      General: He is not in acute distress.    Appearance: Normal appearance. He is not ill-appearing, toxic-appearing or diaphoretic.  HENT:     Head: Normocephalic and atraumatic.  Eyes:     General: No scleral icterus.       Right eye: No discharge.        Left eye: No discharge.     Conjunctiva/sclera: Conjunctivae normal.  Cardiovascular:     Rate and Rhythm: Normal rate and regular rhythm.     Heart sounds: Normal heart sounds. No murmur heard. No friction rub. No gallop.   Pulmonary:     Effort: Pulmonary effort is normal. No respiratory distress.     Breath sounds: No stridor. Examination of the left-lower field reveals rales. Rales present. No decreased breath sounds, wheezing or rhonchi.  Musculoskeletal:        General: Normal range of motion.     Cervical back: Normal range of motion.  Skin:    General: Skin is warm and dry.  Neurological:     Mental Status: He is alert and oriented to person, place, and time.  Mental status is at baseline.  Psychiatric:        Mood and Affect: Mood normal.        Behavior: Behavior normal.        Thought Content: Thought content normal.        Judgment: Judgment normal.

## 2020-09-14 DIAGNOSIS — J189 Pneumonia, unspecified organism: Secondary | ICD-10-CM | POA: Diagnosis not present

## 2020-09-14 DIAGNOSIS — J811 Chronic pulmonary edema: Secondary | ICD-10-CM | POA: Diagnosis not present

## 2020-09-14 DIAGNOSIS — D649 Anemia, unspecified: Secondary | ICD-10-CM | POA: Diagnosis not present

## 2020-09-14 DIAGNOSIS — E039 Hypothyroidism, unspecified: Secondary | ICD-10-CM | POA: Diagnosis not present

## 2020-09-14 DIAGNOSIS — J440 Chronic obstructive pulmonary disease with acute lower respiratory infection: Secondary | ICD-10-CM | POA: Diagnosis not present

## 2020-09-14 DIAGNOSIS — U071 COVID-19: Secondary | ICD-10-CM | POA: Diagnosis not present

## 2020-09-14 DIAGNOSIS — J90 Pleural effusion, not elsewhere classified: Secondary | ICD-10-CM | POA: Diagnosis not present

## 2020-09-14 DIAGNOSIS — A4189 Other specified sepsis: Secondary | ICD-10-CM | POA: Diagnosis not present

## 2020-09-14 DIAGNOSIS — J441 Chronic obstructive pulmonary disease with (acute) exacerbation: Secondary | ICD-10-CM | POA: Diagnosis not present

## 2020-09-14 DIAGNOSIS — E43 Unspecified severe protein-calorie malnutrition: Secondary | ICD-10-CM | POA: Diagnosis not present

## 2020-09-14 DIAGNOSIS — J9601 Acute respiratory failure with hypoxia: Secondary | ICD-10-CM | POA: Diagnosis not present

## 2020-09-14 DIAGNOSIS — Z515 Encounter for palliative care: Secondary | ICD-10-CM | POA: Diagnosis not present

## 2020-09-14 DIAGNOSIS — I482 Chronic atrial fibrillation, unspecified: Secondary | ICD-10-CM | POA: Diagnosis not present

## 2020-09-14 DIAGNOSIS — R0600 Dyspnea, unspecified: Secondary | ICD-10-CM | POA: Diagnosis not present

## 2020-09-14 DIAGNOSIS — J918 Pleural effusion in other conditions classified elsewhere: Secondary | ICD-10-CM | POA: Diagnosis not present

## 2020-09-14 DIAGNOSIS — J1282 Pneumonia due to coronavirus disease 2019: Secondary | ICD-10-CM | POA: Diagnosis not present

## 2020-09-14 DIAGNOSIS — R918 Other nonspecific abnormal finding of lung field: Secondary | ICD-10-CM | POA: Diagnosis not present

## 2020-09-14 DIAGNOSIS — I4891 Unspecified atrial fibrillation: Secondary | ICD-10-CM | POA: Diagnosis not present

## 2020-09-14 DIAGNOSIS — I4819 Other persistent atrial fibrillation: Secondary | ICD-10-CM | POA: Diagnosis not present

## 2020-09-14 DIAGNOSIS — J181 Lobar pneumonia, unspecified organism: Secondary | ICD-10-CM | POA: Diagnosis not present

## 2020-09-14 DIAGNOSIS — Z66 Do not resuscitate: Secondary | ICD-10-CM | POA: Diagnosis not present

## 2020-09-14 DIAGNOSIS — Z7189 Other specified counseling: Secondary | ICD-10-CM | POA: Diagnosis not present

## 2020-09-14 DIAGNOSIS — I350 Nonrheumatic aortic (valve) stenosis: Secondary | ICD-10-CM | POA: Diagnosis not present

## 2020-09-14 DIAGNOSIS — I129 Hypertensive chronic kidney disease with stage 1 through stage 4 chronic kidney disease, or unspecified chronic kidney disease: Secondary | ICD-10-CM | POA: Diagnosis not present

## 2020-09-14 DIAGNOSIS — J986 Disorders of diaphragm: Secondary | ICD-10-CM | POA: Diagnosis not present

## 2020-09-14 DIAGNOSIS — N179 Acute kidney failure, unspecified: Secondary | ICD-10-CM | POA: Diagnosis not present

## 2020-09-14 DIAGNOSIS — N184 Chronic kidney disease, stage 4 (severe): Secondary | ICD-10-CM | POA: Diagnosis not present

## 2020-09-14 DIAGNOSIS — R042 Hemoptysis: Secondary | ICD-10-CM | POA: Diagnosis not present

## 2020-09-18 LAB — PROTIME-INR

## 2020-09-22 ENCOUNTER — Other Ambulatory Visit: Payer: Self-pay

## 2020-09-22 ENCOUNTER — Ambulatory Visit (INDEPENDENT_AMBULATORY_CARE_PROVIDER_SITE_OTHER): Payer: Medicare Other

## 2020-09-22 DIAGNOSIS — N184 Chronic kidney disease, stage 4 (severe): Secondary | ICD-10-CM | POA: Diagnosis not present

## 2020-09-22 DIAGNOSIS — I129 Hypertensive chronic kidney disease with stage 1 through stage 4 chronic kidney disease, or unspecified chronic kidney disease: Secondary | ICD-10-CM | POA: Diagnosis not present

## 2020-09-22 DIAGNOSIS — U071 COVID-19: Secondary | ICD-10-CM

## 2020-09-22 DIAGNOSIS — I4891 Unspecified atrial fibrillation: Secondary | ICD-10-CM

## 2020-09-22 DIAGNOSIS — Z8546 Personal history of malignant neoplasm of prostate: Secondary | ICD-10-CM

## 2020-09-22 DIAGNOSIS — Z8673 Personal history of transient ischemic attack (TIA), and cerebral infarction without residual deficits: Secondary | ICD-10-CM

## 2020-09-22 DIAGNOSIS — Z7901 Long term (current) use of anticoagulants: Secondary | ICD-10-CM

## 2020-09-22 DIAGNOSIS — H9190 Unspecified hearing loss, unspecified ear: Secondary | ICD-10-CM

## 2020-09-22 DIAGNOSIS — J1282 Pneumonia due to coronavirus disease 2019: Secondary | ICD-10-CM

## 2020-09-22 DIAGNOSIS — Z9181 History of falling: Secondary | ICD-10-CM

## 2020-09-22 DIAGNOSIS — I4892 Unspecified atrial flutter: Secondary | ICD-10-CM

## 2020-09-22 DIAGNOSIS — I350 Nonrheumatic aortic (valve) stenosis: Secondary | ICD-10-CM

## 2020-09-22 DIAGNOSIS — E039 Hypothyroidism, unspecified: Secondary | ICD-10-CM

## 2020-09-22 DIAGNOSIS — N400 Enlarged prostate without lower urinary tract symptoms: Secondary | ICD-10-CM

## 2020-10-02 ENCOUNTER — Ambulatory Visit: Payer: Medicare Other | Admitting: Family Medicine

## 2020-10-16 DEATH — deceased

## 2020-11-30 ENCOUNTER — Ambulatory Visit: Payer: Medicare Other | Admitting: Family Medicine

## 2021-08-04 ENCOUNTER — Ambulatory Visit: Payer: Medicare Other
